# Patient Record
Sex: Male | Born: 1955 | Race: White | Hispanic: No | Marital: Married | Smoking: Current every day smoker
Health system: Southern US, Community
[De-identification: ages and names within clinical notes are randomized; demographics above are authoritative.]

## PROBLEM LIST (undated history)

## (undated) DIAGNOSIS — F1020 Alcohol dependence, uncomplicated: Secondary | ICD-10-CM

## (undated) DIAGNOSIS — R918 Other nonspecific abnormal finding of lung field: Secondary | ICD-10-CM

## (undated) HISTORY — PX: LEG SURGERY: SHX1003

## (undated) HISTORY — PX: BACK SURGERY: SHX140

## (undated) HISTORY — PX: PELVIC FRACTURE SURGERY: SHX119

---

## 2019-03-23 ENCOUNTER — Other Ambulatory Visit: Payer: Self-pay

## 2019-03-23 ENCOUNTER — Emergency Department
Admission: EM | Admit: 2019-03-23 | Discharge: 2019-03-23 | Disposition: A | Discharge status: Home / Self Care | Payer: No Typology Code available for payment source | Attending: Emergency Medicine | Admitting: Emergency Medicine

## 2019-03-23 ENCOUNTER — Encounter: Payer: Self-pay | Admitting: Emergency Medicine

## 2019-03-23 ENCOUNTER — Emergency Department: Payer: No Typology Code available for payment source

## 2019-03-23 DIAGNOSIS — S52501A Unspecified fracture of the lower end of right radius, initial encounter for closed fracture: Secondary | ICD-10-CM | POA: Diagnosis not present

## 2019-03-23 DIAGNOSIS — Y92481 Parking lot as the place of occurrence of the external cause: Secondary | ICD-10-CM | POA: Insufficient documentation

## 2019-03-23 DIAGNOSIS — S52611A Displaced fracture of right ulna styloid process, initial encounter for closed fracture: Secondary | ICD-10-CM | POA: Diagnosis not present

## 2019-03-23 DIAGNOSIS — Y9389 Activity, other specified: Secondary | ICD-10-CM | POA: Insufficient documentation

## 2019-03-23 DIAGNOSIS — W1789XA Other fall from one level to another, initial encounter: Secondary | ICD-10-CM | POA: Diagnosis not present

## 2019-03-23 DIAGNOSIS — Y998 Other external cause status: Secondary | ICD-10-CM | POA: Diagnosis not present

## 2019-03-23 DIAGNOSIS — S6991XA Unspecified injury of right wrist, hand and finger(s), initial encounter: Secondary | ICD-10-CM | POA: Diagnosis present

## 2019-03-23 MED ORDER — OXYCODONE-ACETAMINOPHEN 7.5-325 MG PO TABS: 1.0000 | ORAL_TABLET | Freq: Four times a day (QID) | ORAL | 0 refills | Status: AC | PRN

## 2019-03-23 MED ORDER — OXYCODONE-ACETAMINOPHEN 7.5-325 MG PO TABS
1.0000 | ORAL_TABLET | Freq: Once | ORAL | Status: AC
  Administered 2019-03-23: 1 via ORAL
  Filled 2019-03-23: qty 1

## 2019-03-23 NOTE — ED Triage Notes (Signed)
Pt c/o falling out of the back of a truck at [employer] and hurting his right arm/wrist. Swelling noted.

## 2019-03-23 NOTE — ED Notes (Signed)
Se triage note  Presents s/p fall  States he fell while loading something at [employer]  Positive swelling  Good pulses

## 2019-03-23 NOTE — Discharge Instructions (Signed)
Follow-up with Dr. Posey Pronto who is the orthopedist on call today.  Ice and elevation to reduce swelling which will help with pain.  Also pain medication was printed out for you to take to the [facility] may also follow-up with a orthopedist at the [facility] if that can be arranged.  Do not take the splint off until you are seen by the orthopedist.  Also the sling can be used for added support.  Do not wear the sling while sleeping.  You may prop your wrist up on 1 or 2 pillows at night.

## 2019-03-23 NOTE — ED Provider Notes (Signed)
[facility] Emergency Department Provider Note   ____________________________________________   First MD Initiated Contact with Patient 03/23/19 1206     (approximate)  I have reviewed the triage vital signs and the nursing notes.   HISTORY  Chief Complaint Arm Injury   HPI Kevin Baker is a 63 y.o. male presents to the ED with complaint of right wrist pain.  Patient states that he fell off his nephew's truck bed at [employer] today.  Patient denies any head injury or loss of consciousness.  He states his only injury is his right wrist.  Currently rates his pain as 9/10.     History reviewed. No pertinent past medical history.  There are no active problems to display for this patient.   History reviewed. No pertinent surgical history.  Prior to Admission medications   Medication Sig Start Date End Date Taking? Authorizing Provider  oxyCODONE-acetaminophen (PERCOCET) 7.5-325 MG tablet Take 1 tablet by mouth every 6 (six) hours as needed for moderate pain. 03/23/19 03/22/20  Johnn Hai, PA-C    Allergies Patient has no allergy information on record.  No family history on file.  Social History Social History   Tobacco Use  . Smoking status: Not on file  Substance Use Topics  . Alcohol use: Not on file  . Drug use: Not on file    Review of Systems Constitutional: No fever/chills Eyes: No visual changes. ENT: No trauma. Cardiovascular: Denies chest pain. Respiratory: Denies shortness of breath. Gastrointestinal: No abdominal pain.  No nausea, no vomiting. Genitourinary: Negative for dysuria. Musculoskeletal: Positive right wrist pain. Skin: Negative for rash. Neurological: Negative for headaches, focal weakness or numbness. ____________________________________________   PHYSICAL EXAM:  VITAL SIGNS: ED Triage Vitals  Enc Vitals Group     BP 03/23/19 1049 113/72     Pulse Rate 03/23/19 1049 70     Resp 03/23/19 1049 (!) 22   Temp 03/23/19 1049 98 F (36.7 C)     Temp Source 03/23/19 1049 Oral     SpO2 03/23/19 1049 97 %     Weight 03/23/19 1042 170 lb (77.1 kg)     Height 03/23/19 1042 5\' 10"  (1.778 m)     Head Circumference --      Peak Flow --      Pain Score 03/23/19 1041 9     Pain Loc --      Pain Edu? --      Excl. in [place]? --    Constitutional: Alert and oriented. Well appearing and in no acute distress. Eyes: Conjunctivae are normal. Head: Atraumatic. Nose: No trauma. Neck: No stridor.  No cervical tenderness on palpation posteriorly. Cardiovascular: Normal rate, regular rhythm. Grossly normal heart sounds.  Good peripheral circulation. Respiratory: Normal respiratory effort.  No retractions. Lungs CTAB. Musculoskeletal: Examination of the right wrist there is moderate tenderness at the distal radius and ulna.  Range of motion is completely restricted secondary to pain.  Skin is intact.  Motor sensory function intact distal to his injury.  Capillary refill is less than 3 seconds.  No tenderness on palpation of the remaining forearm or elbow.  Patient is nontender on the right shoulder.  He is able move lower extremities without any difficulty. Neurologic:  Normal speech and language. No gross focal neurologic deficits are appreciated.  Skin:  Skin is warm, dry and intact.  Psychiatric: Mood and affect are normal. Speech and behavior are normal.  ____________________________________________   LABS (all labs ordered are  listed, but only abnormal results are displayed)  Labs Reviewed - No data to display  RADIOLOGY   Official radiology report(s): Dg Wrist Complete Right  Result Date: 03/23/2019 CLINICAL DATA:  Fall. EXAM: RIGHT WRIST - COMPLETE 3+ VIEW COMPARISON:  None. FINDINGS: Comminuted fracture distal radius with dorsal angulation and mild dorsal displacement. Avulsion fracture base of the ulnar styloid. Chronic sclerotic ossicles adjacent to the ulnar styloid. Radiocarpal joint normal.   Carpal joints normal.  No other fracture. IMPRESSION: Fracture distal radius and ulnar styloid. Electronically Signed   By: Franchot Gallo M.D.   On: 03/23/2019 11:13    ____________________________________________   PROCEDURES  Procedure(s) performed (including Critical Care):  Procedures Volar OCL splint and sling was applied by Bevelyn Buckles ED tech  ____________________________________________   INITIAL IMPRESSION / ASSESSMENT AND PLAN / ED COURSE  As part of my medical decision making, I reviewed the following data within the electronic MEDICAL RECORD NUMBER Notes from prior ED visits and [place] Controlled Substance Database  63 year old male presents to the ED after falling off the back of his son's truck.  Patient complains only of his wrist hurting and states there was no head injury or LOC.  On exam there was moderate swelling and high suspicion for fracture.  X-ray confirmed that he had a fracture distal radius and ulnar styloid.  Patient was placed in a splint and sling.  He was instructed to ice and elevate as needed for swelling.  He was given a prescription for pain medication as he wants to take this to the [facility] to see if he can get it filled due to lack of funds.  He is instructed to follow-up with Dr. Posey Pronto at [facility] orthopedic department unless he can be taken care of by the orthopedic department at the [facility] hospital.  ____________________________________________   FINAL CLINICAL IMPRESSION(S) / ED DIAGNOSES  Final diagnoses:  Closed fracture of distal end of right radius, unspecified fracture morphology, initial encounter  Closed displaced fracture of styloid process of right ulna, initial encounter     ED Discharge Orders         Ordered    oxyCODONE-acetaminophen (PERCOCET) 7.5-325 MG tablet  Every 6 hours PRN     03/23/19 1335           Note:  This document was prepared using Dragon voice recognition software and may include unintentional dictation  errors.    Johnn Hai, PA-C 03/23/19 1521    Lavonia Drafts, MD 03/23/19 [phone]

## 2020-04-08 DIAGNOSIS — Z23 Encounter for immunization: Secondary | ICD-10-CM | POA: Diagnosis not present

## 2020-04-08 DIAGNOSIS — M79601 Pain in right arm: Secondary | ICD-10-CM | POA: Diagnosis not present

## 2020-04-08 DIAGNOSIS — R634 Abnormal weight loss: Secondary | ICD-10-CM | POA: Diagnosis not present

## 2020-04-08 DIAGNOSIS — M79605 Pain in left leg: Secondary | ICD-10-CM | POA: Diagnosis not present

## 2020-04-08 DIAGNOSIS — Z125 Encounter for screening for malignant neoplasm of prostate: Secondary | ICD-10-CM | POA: Diagnosis not present

## 2020-04-09 DIAGNOSIS — M19011 Primary osteoarthritis, right shoulder: Secondary | ICD-10-CM | POA: Diagnosis not present

## 2020-04-09 DIAGNOSIS — M1612 Unilateral primary osteoarthritis, left hip: Secondary | ICD-10-CM | POA: Diagnosis not present

## 2020-04-09 DIAGNOSIS — M79605 Pain in left leg: Secondary | ICD-10-CM | POA: Diagnosis not present

## 2020-04-09 DIAGNOSIS — M79601 Pain in right arm: Secondary | ICD-10-CM | POA: Diagnosis not present

## 2020-04-18 DIAGNOSIS — M1652 Unilateral post-traumatic osteoarthritis, left hip: Secondary | ICD-10-CM | POA: Diagnosis not present

## 2020-04-18 DIAGNOSIS — M25552 Pain in left hip: Secondary | ICD-10-CM | POA: Diagnosis not present

## 2020-04-18 DIAGNOSIS — M47816 Spondylosis without myelopathy or radiculopathy, lumbar region: Secondary | ICD-10-CM | POA: Diagnosis not present

## 2020-04-25 DIAGNOSIS — Z9181 History of falling: Secondary | ICD-10-CM | POA: Diagnosis not present

## 2020-04-25 DIAGNOSIS — M1652 Unilateral post-traumatic osteoarthritis, left hip: Secondary | ICD-10-CM | POA: Diagnosis not present

## 2020-04-25 DIAGNOSIS — R32 Unspecified urinary incontinence: Secondary | ICD-10-CM | POA: Diagnosis not present

## 2020-04-25 DIAGNOSIS — M47816 Spondylosis without myelopathy or radiculopathy, lumbar region: Secondary | ICD-10-CM | POA: Diagnosis not present

## 2020-04-26 DIAGNOSIS — M1652 Unilateral post-traumatic osteoarthritis, left hip: Secondary | ICD-10-CM | POA: Diagnosis not present

## 2020-04-26 DIAGNOSIS — M25552 Pain in left hip: Secondary | ICD-10-CM | POA: Diagnosis not present

## 2020-04-26 DIAGNOSIS — M47816 Spondylosis without myelopathy or radiculopathy, lumbar region: Secondary | ICD-10-CM | POA: Diagnosis not present

## 2020-04-30 DIAGNOSIS — Z9181 History of falling: Secondary | ICD-10-CM | POA: Diagnosis not present

## 2020-04-30 DIAGNOSIS — R32 Unspecified urinary incontinence: Secondary | ICD-10-CM | POA: Diagnosis not present

## 2020-04-30 DIAGNOSIS — M47816 Spondylosis without myelopathy or radiculopathy, lumbar region: Secondary | ICD-10-CM | POA: Diagnosis not present

## 2020-04-30 DIAGNOSIS — M1652 Unilateral post-traumatic osteoarthritis, left hip: Secondary | ICD-10-CM | POA: Diagnosis not present

## 2020-05-02 DIAGNOSIS — R32 Unspecified urinary incontinence: Secondary | ICD-10-CM | POA: Diagnosis not present

## 2020-05-02 DIAGNOSIS — M47816 Spondylosis without myelopathy or radiculopathy, lumbar region: Secondary | ICD-10-CM | POA: Diagnosis not present

## 2020-05-02 DIAGNOSIS — Z9181 History of falling: Secondary | ICD-10-CM | POA: Diagnosis not present

## 2020-05-02 DIAGNOSIS — M1652 Unilateral post-traumatic osteoarthritis, left hip: Secondary | ICD-10-CM | POA: Diagnosis not present

## 2020-05-13 ENCOUNTER — Other Ambulatory Visit: Payer: Self-pay

## 2020-05-13 ENCOUNTER — Observation Stay
Admission: EM | Admit: 2020-05-13 | Discharge: 2020-05-14 | Disposition: A | Discharge status: Home / Self Care | Payer: No Typology Code available for payment source | Attending: Internal Medicine | Admitting: Internal Medicine

## 2020-05-13 ENCOUNTER — Emergency Department: Payer: No Typology Code available for payment source

## 2020-05-13 DIAGNOSIS — R1032 Left lower quadrant pain: Secondary | ICD-10-CM | POA: Diagnosis not present

## 2020-05-13 DIAGNOSIS — R600 Localized edema: Principal | ICD-10-CM | POA: Insufficient documentation

## 2020-05-13 DIAGNOSIS — R0602 Shortness of breath: Secondary | ICD-10-CM | POA: Diagnosis not present

## 2020-05-13 DIAGNOSIS — M169 Osteoarthritis of hip, unspecified: Secondary | ICD-10-CM

## 2020-05-13 DIAGNOSIS — R262 Difficulty in walking, not elsewhere classified: Secondary | ICD-10-CM

## 2020-05-13 DIAGNOSIS — M79604 Pain in right leg: Secondary | ICD-10-CM

## 2020-05-13 DIAGNOSIS — F17210 Nicotine dependence, cigarettes, uncomplicated: Secondary | ICD-10-CM | POA: Diagnosis not present

## 2020-05-13 DIAGNOSIS — I509 Heart failure, unspecified: Secondary | ICD-10-CM | POA: Diagnosis not present

## 2020-05-13 DIAGNOSIS — I5031 Acute diastolic (congestive) heart failure: Secondary | ICD-10-CM | POA: Diagnosis not present

## 2020-05-13 DIAGNOSIS — R0601 Orthopnea: Secondary | ICD-10-CM | POA: Insufficient documentation

## 2020-05-13 DIAGNOSIS — Z20822 Contact with and (suspected) exposure to covid-19: Secondary | ICD-10-CM | POA: Insufficient documentation

## 2020-05-13 DIAGNOSIS — K4090 Unilateral inguinal hernia, without obstruction or gangrene, not specified as recurrent: Secondary | ICD-10-CM | POA: Diagnosis not present

## 2020-05-13 DIAGNOSIS — M25552 Pain in left hip: Secondary | ICD-10-CM

## 2020-05-13 DIAGNOSIS — R918 Other nonspecific abnormal finding of lung field: Secondary | ICD-10-CM

## 2020-05-13 DIAGNOSIS — I82409 Acute embolism and thrombosis of unspecified deep veins of unspecified lower extremity: Secondary | ICD-10-CM

## 2020-05-13 DIAGNOSIS — R609 Edema, unspecified: Secondary | ICD-10-CM

## 2020-05-13 DIAGNOSIS — R2243 Localized swelling, mass and lump, lower limb, bilateral: Secondary | ICD-10-CM | POA: Diagnosis present

## 2020-05-13 DIAGNOSIS — Z789 Other specified health status: Secondary | ICD-10-CM

## 2020-05-13 LAB — COMPREHENSIVE METABOLIC PANEL
ALT: 7 U/L (ref 0–44)
AST: 17 U/L (ref 15–41)
Albumin: 3.3 g/dL — ABNORMAL LOW (ref 3.5–5.0)
Alkaline Phosphatase: 50 U/L (ref 38–126)
Anion gap: 10 (ref 5–15)
BUN: 9 mg/dL (ref 8–23)
CO2: 25 mmol/L (ref 22–32)
Calcium: 8.9 mg/dL (ref 8.9–10.3)
Chloride: 102 mmol/L (ref 98–111)
Creatinine, Ser: 0.58 mg/dL — ABNORMAL LOW (ref 0.61–1.24)
GFR, Estimated: >60 mL/min (ref >60)
Glucose, Bld: 95 mg/dL (ref 70–99)
Potassium: 3.6 mmol/L (ref 3.5–5.1)
Sodium: 137 mmol/L (ref 135–145)
Total Bilirubin: 0.8 mg/dL (ref 0.3–1.2)
Total Protein: 6.9 g/dL (ref 6.5–8.1)

## 2020-05-13 LAB — CBC
HCT: 32.3 % — ABNORMAL LOW (ref 39.0–52.0)
Hemoglobin: 11.1 g/dL — ABNORMAL LOW (ref 13.0–17.0)
MCH: 35 pg — ABNORMAL HIGH (ref 26.0–34.0)
MCHC: 34.4 g/dL (ref 30.0–36.0)
MCV: 101.9 fL — ABNORMAL HIGH (ref 80.0–100.0)
Platelets: 384 10*3/uL (ref 150–400)
RBC: 3.17 MIL/uL — ABNORMAL LOW (ref 4.22–5.81)
RDW: 13.2 % (ref 11.5–15.5)
WBC: 9.2 10*3/uL (ref 4.0–10.5)
nRBC: 0 % (ref 0.0–0.2)

## 2020-05-13 LAB — URINALYSIS, COMPLETE (UACMP) WITH MICROSCOPIC
Bacteria, UA: NONE SEEN
Bilirubin Urine: NEGATIVE
Glucose, UA: NEGATIVE mg/dL
Hgb urine dipstick: NEGATIVE
Ketones, ur: 5 mg/dL — AB
Nitrite: NEGATIVE
Protein, ur: NEGATIVE mg/dL
Specific Gravity, Urine: 1.012 (ref 1.005–1.030)
Squamous Epithelial / HPF: NONE SEEN (ref 0–5)
pH: 5 (ref 5.0–8.0)

## 2020-05-13 LAB — BRAIN NATRIURETIC PEPTIDE: B Natriuretic Peptide: 120.2 pg/mL — ABNORMAL HIGH (ref 0.0–100.0)

## 2020-05-13 LAB — TROPONIN I (HIGH SENSITIVITY)
Troponin I (High Sensitivity): 4 ng/L (ref <18)
Troponin I (High Sensitivity): 4 ng/L (ref <18)

## 2020-05-13 LAB — RESP PANEL BY RT-PCR (FLU A&B, COVID) ARPGX2
Influenza A by PCR: NEGATIVE
Influenza B by PCR: NEGATIVE
SARS Coronavirus 2 by RT PCR: NEGATIVE

## 2020-05-13 LAB — LIPASE, BLOOD: Lipase: 26 U/L (ref 11–51)

## 2020-05-13 LAB — TSH: TSH: 3.716 u[IU]/mL (ref 0.350–4.500)

## 2020-05-13 MED ORDER — SODIUM CHLORIDE 0.9% FLUSH: 3.0000 mL | INTRAVENOUS | Status: DC | PRN

## 2020-05-13 MED ORDER — ACETAMINOPHEN 325 MG PO TABS: 650.0000 mg | ORAL_TABLET | ORAL | Status: DC | PRN

## 2020-05-13 MED ORDER — ONDANSETRON HCL 4 MG/2ML IJ SOLN: 4.0000 mg | Freq: Four times a day (QID) | INTRAMUSCULAR | Status: DC | PRN

## 2020-05-13 MED ORDER — FUROSEMIDE 10 MG/ML IJ SOLN
40.0000 mg | Freq: Once | INTRAMUSCULAR | Status: AC
  Administered 2020-05-13: 40 mg via INTRAVENOUS
  Filled 2020-05-13: qty 4

## 2020-05-13 MED ORDER — FUROSEMIDE 10 MG/ML IJ SOLN
20.0000 mg | Freq: Two times a day (BID) | INTRAMUSCULAR | Status: DC
  Administered 2020-05-14: 10:00:00 20 mg via INTRAVENOUS
  Filled 2020-05-13: qty 4

## 2020-05-13 MED ORDER — ENOXAPARIN SODIUM 40 MG/0.4ML ~~LOC~~ SOLN
40.0000 mg | SUBCUTANEOUS | Status: DC
  Administered 2020-05-13: 40 mg via SUBCUTANEOUS

## 2020-05-13 MED ORDER — SODIUM CHLORIDE 0.9 % IV SOLN: 250.0000 mL | INTRAVENOUS | Status: DC | PRN

## 2020-05-13 MED ORDER — SODIUM CHLORIDE 0.9% FLUSH
3.0000 mL | Freq: Two times a day (BID) | INTRAVENOUS | Status: DC
  Administered 2020-05-14: 10:00:00 3 mL via INTRAVENOUS

## 2020-05-13 MED ORDER — MORPHINE SULFATE (PF) 4 MG/ML IV SOLN
4.0000 mg | Freq: Once | INTRAVENOUS | Status: AC
  Administered 2020-05-13: 4 mg via INTRAVENOUS
  Filled 2020-05-13: qty 1

## 2020-05-13 NOTE — ED Triage Notes (Signed)
PT to ED via EMS from home c/o groin pain as well as bilateral leg swelling, worse on left side. 3 weeks ago he got some kind of shot in his groin "towards the hip" and has been "suffering ever sicne" and pain is getting worse. PT states pain is worse when he coughs. No obvious hernia.  Endorses some SHOB but states it may be because he is a smoker.  PT also states intermittent diarrhea for 3 months.

## 2020-05-13 NOTE — H&P (Signed)
History and Physical    Nathaneal Sommers JME:[id number] DOB: 1955/09/15 DOA: 05/13/2020  PCP: Patient, No Pcp Per   Patient coming from: home   I have personally briefly reviewed patient's old medical records in [place]  Chief Complaint: Lower leg swelling, persistent left groin pain  HPI: Taryn Nave is a 64 y.o. 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  Also complains of worsening left hip pain, and difficulty ambulating since his steroid shot.  In the ER he was noted to have bilateral lower extremities swelling which has been there for the past x3 weeks.  He endorses shortness of breath with some orthopnea and has a cough.  He denies chest pain, fever or chills.   ED Course: On arrival, vitals within normal limits.  He had 2 - troponins of 4 and an elevated BNP of 120.  Mild macrocytic anemia with hemoglobin of 11.1 and MCV 101.9. EKG as reviewed by me : Normal sinus rhythm at 76 and no acute ST-T wave changes Imaging: Chest x-ray, left basilar opacity, atelectasis versus infiltrate.  Left hip x-ray showed no acute osseous abnormality Addendum: Post admission CT abdomen and pelvis showed "Lobulated mass within the left lung base, not fully assessed on this examination, suspicious or primary bronchogenic neoplasm"  Review of Systems: As per HPI otherwise all other systems on review of systems negative.    History reviewed. No pertinent past medical history.  Past Surgical History:  Procedure Laterality Date  . LEG SURGERY Right      reports that he has been smoking cigarettes. He has been smoking about 3.00 packs per day. He has never used smokeless tobacco. He reports current  alcohol use. He reports previous drug use.  No Known Allergies  History reviewed. No pertinent family history.    Prior to Admission medications   Not on File    Physical Exam: Vitals:   05/13/20 1841 05/13/20 1933 05/13/20 1945 05/13/20 2000  BP: 120/79 110/85 124/74 (!) 148/24  Pulse: 75 75 70 79  Resp: 16 20    Temp: 98.4 F (36.9 C) 98.2 F (36.8 C)    TempSrc: Oral Oral    SpO2: 97% 95% 99% 100%  Weight:      Height:         Vitals:   05/13/20 1841 05/13/20 1933 05/13/20 1945 05/13/20 2000  BP: 120/79 110/85 124/74 (!) 148/24  Pulse: 75 75 70 79  Resp: 16 20    Temp: 98.4 F (36.9 C) 98.2 F (36.8 C)    TempSrc: Oral Oral    SpO2: 97% 95% 99% 100%  Weight:      Height:          Constitutional:  Unkempt, ill-appearing,.  Lying on left side due to pain HEENT:      Head: Normocephalic and atraumatic.         Eyes: PERLA, EOMI, Conjunctivae are normal. Sclera is non-icteric.       Mouth/Throat: Mucous membranes are moist.       Neck: Supple with no signs of meningismus. Cardiovascular: Regular rate and rhythm. No murmurs, gallops, or rubs. 2+ symmetrical distal pulses are present . No JVD.  3+LE edema Respiratory: Respiratory effort slightly increased.Lungs sounds diminished on left. No wheezes, crackles, or rhonchi.  Gastrointestinal: Soft, pain on palpation in left inguinal area, no definite hernia felt  genitourinary: No CVA tenderness. Musculoskeletal:  Pain on range of motion of left hip.  No cyanosis, or erythema of extremities.  Bilateral lower extremity soft pitting edema Neurologic:  Face is symmetric. Moving all extremities. No gross focal neurologic deficits . Skin: Skin is warm, dry.  No rash or ulcers Psychiatric: Mood and affect are normal    Labs on Admission: I have personally reviewed following labs and imaging studies  CBC: Recent Labs  Lab 05/13/20 1557  WBC 9.2  HGB 11.1*  HCT 32.3*  MCV 101.9*  PLT 035   Basic Metabolic  Panel: Recent Labs  Lab 05/13/20 1557  NA 137  K 3.6  CL 102  CO2 25  GLUCOSE 95  BUN 9  CREATININE 0.58*  CALCIUM 8.9   GFR: Estimated Creatinine Clearance: 88.5 mL/min (A) (by C-G formula based on SCr of 0.58 mg/dL (L)). Liver Function Tests: Recent Labs  Lab 05/13/20 1557  AST 17  ALT 7  ALKPHOS 50  BILITOT 0.8  PROT 6.9  ALBUMIN 3.3*   Recent Labs  Lab 05/13/20 1557  LIPASE 26   No results for input(s): AMMONIA in the last 168 hours. Coagulation Profile: No results for input(s): INR, PROTIME in the last 168 hours. Cardiac Enzymes: No results for input(s): CKTOTAL, CKMB, CKMBINDEX, TROPONINI in the last 168 hours. BNP (last 3 results) No results for input(s): PROBNP in the last 8760 hours. HbA1C: No results for input(s): HGBA1C in the last 72 hours. CBG: No results for input(s): GLUCAP in the last 168 hours. Lipid Profile: No results for input(s): CHOL, HDL, LDLCALC, TRIG, CHOLHDL, LDLDIRECT in the last 72 hours. Thyroid Function Tests: No results for input(s): TSH, T4TOTAL, FREET4, T3FREE, THYROIDAB in the last 72 hours. Anemia Panel: No results for input(s): VITAMINB12, FOLATE, FERRITIN, TIBC, IRON, RETICCTPCT in the last 72 hours. Urine analysis: No results found for: COLORURINE, APPEARANCEUR, LABSPEC, [place], GLUCOSEU, HGBUR, BILIRUBINUR, KETONESUR, PROTEINUR, UROBILINOGEN, NITRITE, LEUKOCYTESUR  Radiological Exams on Admission: DG Chest 2 View  Result Date: 05/13/2020 CLINICAL DATA:  Shortness of breath EXAM: CHEST - 2 VIEW COMPARISON:  None. FINDINGS: Linear left basilar opacity. No pneumothorax or pleural effusion. Cardiomediastinal silhouette within normal limits. No acute osseous abnormality. IMPRESSION: Left basilar opacity, atelectasis versus infiltrate. Electronically Signed   By: Primitivo Gauze M.D.   On: 05/13/2020 16:33   DG Hip Unilat W or Wo Pelvis 2-3 Views Left  Result Date: 05/13/2020 CLINICAL DATA:  Fall pain EXAM: DG HIP (WITH  OR WITHOUT PELVIS) 2-3V LEFT COMPARISON:  None. FINDINGS: There is no evidence of hip fracture or dislocation. Again a probable healed fracture deformity the inferior pubic rami and acetabulum. Vascular calcifications are seen. IMPRESSION: No acute osseous abnormality Electronically Signed   By: Prudencio Pair M.D.   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  Chest x-ray showing left basilar opacity (addendum, left lung mass seen on CT abdomen and pelvis) -Received Lasix 40 mg IV in the emergency room -We will continue low-dose Lasix -We will hold beta-blocker and ACE inhibitor for now given just modest suspicion for CHF -Echocardiogram in the a.m. -Daily weights, intake and output monitoring    Acute on chronic pain of left hip   Ambulatory dysfunction   Degenerative joint disease (DJD) of hip -Patient received an intra-articular steroid shot to the left hip under ultrasound guidance by orthopedist on 12/10, now presents with persistent pain and worsening ambulation -Pain management -Physical therapy consult in the a.m.  Left groin pain -While awaiting admission, patient started vomiting in the ER and CT abdomen and pelvis was ordered -CT abdomen and pelvis showing a fat-containing left inguinal hernia  -Pain control -Surgical consult  Lung mass on CT -Patient symptomatic for cough -CT abdomen and pelvis showed lobulated mass within the left lung base,  not fully assessed on this examination, suspicious or primary bronchogenic neoplasm with recommendation for CT chest with contrast. -Consider pulmonology/oncology consult in the a.m.  Alcohol use disorder -Patient drinks vodka and whiskey about 5 drinks daily -Last drink was a few hours prior to arrival -CIWA withdrawal protocol    DVT prophylaxis: Lovenox  Code Status: full code  Family Communication:  none  Disposition Plan: Back to previous home environment Consults called: Surgery Status: Observation    Athena Masse MD [facility]     05/13/2020, 10:18 PM

## 2020-05-13 NOTE — ED Provider Notes (Signed)
[facility] Emergency Department Provider Note ____________________________________________   Event Date/Time   First MD Initiated Contact with Patient 05/13/20 1935     (approximate)  I have reviewed the triage vital signs and the nursing notes.  HISTORY  Chief Complaint Leg Swelling   HPI Kevin Baker is a 64 y.o. malewho presents to the ED for evaluation of leg swelling.   Chart review indicates history of osteoarthritis of the left hip and orthopedic visit on 12/10 where patient had a steroid injection to this left hip joint. Otherwise history of alcohol abuse and hep C, PTSD, gastritis.  Patient reports acute on chronic left hip pain at the site of his arthritis that is atraumatic.  Denies any recent falls.  He does report increasing lower extremity edema, swelling there is also atraumatic.  He reports shortness of breath and orthopnea alongside this.  Denies any chest pain, pressure, cough, emesis or abdominal pain.  Reports having difficulty ambulating with his baseline walker due to the swelling and pain in his legs.  History reviewed. No pertinent past medical history.  There are no problems to display for this patient.   Past Surgical History:  Procedure Laterality Date  . LEG SURGERY Right     Prior to Admission medications   Not on File    Allergies Patient has no known allergies.  No family history on file.  Social History Social History   Tobacco Use  . Smoking status: Current Every Day Smoker    Packs/day: 3.00    Types: Cigarettes  . Smokeless tobacco: Never Used  Substance Use Topics  . Alcohol use: Yes    Comment: for last 3 weeks, drinking daily   . Drug use: Not Currently    Review of Systems  Constitutional: No fever/chills Eyes: No visual changes. ENT: No sore throat. Cardiovascular: Denies chest pain. Respiratory: Positive shortness of breath and orthopnea. Gastrointestinal: No abdominal pain.  No nausea,  no vomiting.  No diarrhea.  No constipation. Genitourinary: Negative for dysuria. Musculoskeletal: Positive for leg swelling and pain. Skin: Negative for rash. Neurological: Negative for headaches, focal weakness or numbness.  ____________________________________________   PHYSICAL EXAM:  VITAL SIGNS: Vitals:   05/13/20 1945 05/13/20 2000  BP: 124/74 (!) 148/24  Pulse: 70 79  Resp:    Temp:    SpO2: 99% 100%     Constitutional: Alert and oriented. Well appearing and in no acute distress. Eyes: Conjunctivae are normal. PERRL. EOMI. Head: Atraumatic. Nose: No congestion/rhinnorhea. Mouth/Throat: Mucous membranes are moist.  Oropharynx non-erythematous. Neck: No stridor. No cervical spine tenderness to palpation. Cardiovascular: Normal rate, regular rhythm. Grossly normal heart sounds.  Good peripheral circulation. Respiratory: Mild tachypnea to the low 20s bibasilar crackles present, otherwise clear.  No distress or retractions. Gastrointestinal: Soft , nondistended, nontender to palpation. No CVA tenderness. Musculoskeletal: No signs of trauma to the lower extremities or back. Pitting edema to bilateral lower extremities without overlying skin changes. Neurologic:  Normal speech and language. No gross focal neurologic deficits are appreciated.  Skin:  Skin is warm, dry and intact. No rash noted. Psychiatric: Mood and affect are normal. Speech and behavior are normal.  ____________________________________________   LABS (all labs ordered are listed, but only abnormal results are displayed)  Labs Reviewed  COMPREHENSIVE METABOLIC PANEL - Abnormal; Notable for the following components:      Result Value   Creatinine, Ser 0.58 (*)    Albumin 3.3 (*)    All other components within  normal limits  CBC - Abnormal; Notable for the following components:   RBC 3.17 (*)    Hemoglobin 11.1 (*)    HCT 32.3 (*)    MCV 101.9 (*)    MCH 35.0 (*)    All other components within  normal limits  BRAIN NATRIURETIC PEPTIDE - Abnormal; Notable for the following components:   B Natriuretic Peptide 120.2 (*)    All other components within normal limits  RESP PANEL BY RT-PCR (FLU A&B, COVID) ARPGX2  LIPASE, BLOOD  URINALYSIS, COMPLETE (UACMP) WITH MICROSCOPIC  TROPONIN I (HIGH SENSITIVITY)  TROPONIN I (HIGH SENSITIVITY)   ____________________________________________  12 Lead EKG  Sinus rhythm, Rate of 76 bpm.  Normal axis and intervals.  No evidence of acute ischemia. ____________________________________________  RADIOLOGY  ED MD interpretation: 2 view CXR with pulmonary vascular congestion and left linear basilar opacity.  Official radiology report(s): DG Chest 2 View  Result Date: 05/13/2020 CLINICAL DATA:  Shortness of breath EXAM: CHEST - 2 VIEW COMPARISON:  None. FINDINGS: Linear left basilar opacity. No pneumothorax or pleural effusion. Cardiomediastinal silhouette within normal limits. No acute osseous abnormality. IMPRESSION: Left basilar opacity, atelectasis versus infiltrate. Electronically Signed   By: Primitivo Gauze M.D.   On: 05/13/2020 16:33   DG Hip Unilat W or Wo Pelvis 2-3 Views Left  Result Date: 05/13/2020 CLINICAL DATA:  Fall pain EXAM: DG HIP (WITH OR WITHOUT PELVIS) 2-3V LEFT COMPARISON:  None. FINDINGS: There is no evidence of hip fracture or dislocation. Again a probable healed fracture deformity the inferior pubic rami and acetabulum. Vascular calcifications are seen. IMPRESSION: No acute osseous abnormality Electronically Signed   By: Prudencio Pair M.D.   On: 05/13/2020 20:51    ____________________________________________   PROCEDURES and INTERVENTIONS  Procedure(s) performed (including Critical Care):  .1-3 Lead EKG Interpretation Performed by: Vladimir Crofts, MD Authorized by: Vladimir Crofts, MD     Interpretation: normal     ECG rate:  78   ECG rate assessment: normal     Rhythm: sinus rhythm     Ectopy: none      Conduction: normal      Medications  furosemide (LASIX) injection 40 mg (has no administration in time range)  morphine 4 MG/ML injection 4 mg (4 mg Intravenous Given 05/13/20 1953)    ____________________________________________   MDM / ED COURSE   64 year old male presents to the ED with worsening lower extremity edema, orthopnea and difficulty walking requiring medical admission.  Normal vitals on room air.  Exam with impressive edema to his bilateral lower extremities without overlying skin changes or signs of trauma.  No distress.  Blood work with elevated BNP, suggestive of possible cardiac source of his edema.  CXR appears congested to me.  Patient has no fevers or leukocytosis to suggest pneumonia at this time.  We will initiate diuresis with Lasix and admit for physical therapy, diuresis.   ____________________________________________   FINAL CLINICAL IMPRESSION(S) / ED DIAGNOSES  Final diagnoses:  Peripheral edema  Orthopnea     ED Discharge Orders    None       Dennis Killilea Tamala Julian   Note:  This document was prepared using Dragon voice recognition software and may include unintentional dictation errors.   Vladimir Crofts, MD 05/13/20 2155

## 2020-05-14 ENCOUNTER — Observation Stay: Payer: No Typology Code available for payment source

## 2020-05-14 ENCOUNTER — Observation Stay
Admit: 2020-05-14 | Discharge: 2020-05-14 | Disposition: A | Discharge status: Home / Self Care | Payer: No Typology Code available for payment source | Attending: Internal Medicine | Admitting: Internal Medicine

## 2020-05-14 DIAGNOSIS — R918 Other nonspecific abnormal finding of lung field: Secondary | ICD-10-CM

## 2020-05-14 DIAGNOSIS — K4090 Unilateral inguinal hernia, without obstruction or gangrene, not specified as recurrent: Secondary | ICD-10-CM

## 2020-05-14 LAB — BASIC METABOLIC PANEL
Anion gap: 11 (ref 5–15)
BUN: 8 mg/dL (ref 8–23)
CO2: 27 mmol/L (ref 22–32)
Calcium: 9.2 mg/dL (ref 8.9–10.3)
Chloride: 100 mmol/L (ref 98–111)
Creatinine, Ser: 0.6 mg/dL — ABNORMAL LOW (ref 0.61–1.24)
GFR, Estimated: >60 mL/min (ref >60)
Glucose, Bld: 99 mg/dL (ref 70–99)
Potassium: 3.4 mmol/L — ABNORMAL LOW (ref 3.5–5.1)
Sodium: 138 mmol/L (ref 135–145)

## 2020-05-14 LAB — ECHOCARDIOGRAM COMPLETE
Height: 72 in
S' Lateral: 2.99 cm
Weight: 2400 oz

## 2020-05-14 LAB — MAGNESIUM: Magnesium: 1.1 mg/dL — ABNORMAL LOW (ref 1.7–2.4)

## 2020-05-14 LAB — PHOSPHORUS: Phosphorus: 2.6 mg/dL (ref 2.5–4.6)

## 2020-05-14 MED ORDER — LORAZEPAM 2 MG/ML IJ SOLN
0.0000 mg | Freq: Four times a day (QID) | INTRAMUSCULAR | Status: DC
  Administered 2020-05-14 (×2): 2 mg via INTRAVENOUS
  Filled 2020-05-14 (×2): qty 1

## 2020-05-14 MED ORDER — OXYCODONE HCL 5 MG PO TABS: 5.0000 mg | ORAL_TABLET | ORAL | Status: DC | PRN

## 2020-05-14 MED ORDER — NALTREXONE HCL 50 MG PO TABS
50.0000 mg | ORAL_TABLET | Freq: Every day | ORAL | Status: DC
  Administered 2020-05-14: 10:00:00 50 mg via ORAL
  Filled 2020-05-14: qty 1

## 2020-05-14 MED ORDER — BACLOFEN 10 MG PO TABS
10.0000 mg | ORAL_TABLET | Freq: Three times a day (TID) | ORAL | Status: DC | PRN
  Filled 2020-05-14: qty 1

## 2020-05-14 MED ORDER — BUPROPION HCL ER (SR) 150 MG PO TB12
150.0000 mg | ORAL_TABLET | Freq: Every day | ORAL | Status: DC
  Administered 2020-05-14: 10:00:00 150 mg via ORAL
  Filled 2020-05-14: qty 1

## 2020-05-14 MED ORDER — PANTOPRAZOLE SODIUM 40 MG PO TBEC
40.0000 mg | DELAYED_RELEASE_TABLET | Freq: Every day | ORAL | Status: DC
  Administered 2020-05-14: 10:00:00 40 mg via ORAL
  Filled 2020-05-14: qty 1

## 2020-05-14 MED ORDER — THIAMINE HCL 100 MG PO TABS
100.0000 mg | ORAL_TABLET | Freq: Every day | ORAL | Status: DC
  Administered 2020-05-14: 10:00:00 100 mg via ORAL
  Filled 2020-05-14: qty 1

## 2020-05-14 MED ORDER — IOHEXOL 300 MG/ML  SOLN
75.0000 mL | Freq: Once | INTRAMUSCULAR | Status: AC | PRN
  Administered 2020-05-14: 09:00:00 75 mL via INTRAVENOUS
  Filled 2020-05-14: qty 75

## 2020-05-14 MED ORDER — HYDROXYZINE HCL 25 MG PO TABS: 25.0000 mg | ORAL_TABLET | Freq: Three times a day (TID) | ORAL | Status: DC | PRN

## 2020-05-14 MED ORDER — LORAZEPAM 2 MG/ML IJ SOLN: 1.0000 mg | INTRAMUSCULAR | Status: DC | PRN

## 2020-05-14 MED ORDER — LORAZEPAM 2 MG/ML IJ SOLN: 0.0000 mg | Freq: Two times a day (BID) | INTRAMUSCULAR | Status: DC

## 2020-05-14 MED ORDER — THIAMINE HCL 100 MG/ML IJ SOLN: 100.0000 mg | Freq: Every day | INTRAMUSCULAR | Status: DC

## 2020-05-14 MED ORDER — LORAZEPAM 1 MG PO TABS: 1.0000 mg | ORAL_TABLET | ORAL | Status: DC | PRN

## 2020-05-14 MED ORDER — MORPHINE SULFATE (PF) 2 MG/ML IV SOLN: 2.0000 mg | INTRAVENOUS | Status: DC | PRN

## 2020-05-14 MED ORDER — NICOTINE 21 MG/24HR TD PT24
21.0000 mg | MEDICATED_PATCH | Freq: Once | TRANSDERMAL | Status: DC
  Administered 2020-05-14: 02:00:00 21 mg via TRANSDERMAL
  Filled 2020-05-14: qty 1

## 2020-05-14 MED ORDER — ADULT MULTIVITAMIN W/MINERALS CH
1.0000 | ORAL_TABLET | Freq: Every day | ORAL | Status: DC
  Administered 2020-05-14: 10:00:00 1 via ORAL
  Filled 2020-05-14: qty 1

## 2020-05-14 MED ORDER — ADULT MULTIVITAMIN W/MINERALS CH: 1.0000 | ORAL_TABLET | Freq: Every day | ORAL | Status: DC

## 2020-05-14 MED ORDER — FOLIC ACID 1 MG PO TABS
1.0000 mg | ORAL_TABLET | Freq: Every day | ORAL | Status: DC
  Administered 2020-05-14: 10:00:00 1 mg via ORAL
  Filled 2020-05-14: qty 1

## 2020-05-14 MED ORDER — VITAMIN D3 25 MCG (1000 UNIT) PO TABS
2000.0000 [IU] | ORAL_TABLET | Freq: Every day | ORAL | Status: DC
  Administered 2020-05-14: 10:00:00 2000 [IU] via ORAL
  Filled 2020-05-14 (×2): qty 2

## 2020-05-14 NOTE — ED Notes (Signed)
Patient chose to leave department against medical advice. Patient left department before speaking with admitting MD. Last observed awake alert in NAD with family.

## 2020-05-14 NOTE — Discharge Summary (Addendum)
AMA discharge summary  Patient is a 64 year old male with history significant for daily alcohol use, hepatitis C, chronic gastritis, DJD of left hip who presents to the ED for evaluation of increased left groin pain worse with coughing.  Imaging was demonstrative of a small hernia.  I evaluated the patient with Dr. Lysle Pearl of general surgery.  Hernia was identified on exam but felt to be reducible and not incarcerated.  No surgical intervention warranted.  On my evaluation the patient looks uncomfortable and in some distress.  His left leg is markedly edematous more so than his right.  Imaging was concerning for a mass at the base of the lungs suspicious for primary bronchogenic carcinoma.  I discussed these issues with the patient and the need for continued medical follow-up.  This morning he agreed to stay in the hospital and undergo studies.  We also talked about his alcohol consumption.  He was already starting to exhibit mild signs of withdrawal on my evaluation this morning.  I explained to him that his withdrawal symptoms would likely worsen but if he was interested in quitting drinking we would take him to his withdrawals and provide him with resources for substance abuse cessation.  At time of my evaluation again he agreed to these interventions.  Unfortunately at approximately 1300 this afternoon I received a notice from the bedside RN stating that the patient wished to leave AMA.  She requested that I come to bedside and discussed with the patient.  I agreed to do so however was unable to make it until approximately 1400.  At that time the patient had already left without waiting to speak to me.  RN informed patient that he needed to follow-up with his primary care physician soon as possible.  Patient understood and agreed to do so.  Without intervention patient is high risk for medical complications and readmission.  Ralene Muskrat MD  Addendum: CT thorax with contrast resulted.  Highly  suspicious for primary bronchogenic carcinoma.  Patient was strongly encouraged to seek medical attention.  He will need referral to oncology and pulmonology.

## 2020-05-14 NOTE — Evaluation (Signed)
Occupational Therapy Evaluation Patient Details Name: Kevin Baker MRN: [id number] DOB: 08-Dec-1955 Today's Date: 05/14/2020    History of Present Illness Kevin Baker is a 64 y.o. male with PMH of daily alcohol use, hepatitis C, PTSD and gastritis as well as DJD of left hip with chronic pain for which he got an intra-articular steroid shot on 12/10 by his orthopedist, presenting with a complaint of increased left hip pain but also swelling in his bilateral lower extremities x3 weeks, shortness of breath  and has a mild cough   Clinical Impression   Kevin Baker was seen for OT evaluation this date. Prior to hospital admission, pt was Independent for mobility and ADLs. Pt lives alone, states there he recently got a roommate but doe snot interact with him much. Pt presents to acute OT demonstrating impaired ADL performance and functional mobility 2/2 decreased LB access, functional strength/balance deficits, and poor insight into deficits.   Pt found seated at end of stretcher, MOD A sit>sup. MIN A adjust B socks seated EOB - assist 2/2 L hip pain and shakiness. MIN A + HHA for ADL t/f - tolerates <30 seconds standing prior to return to sitting 2/2 shakiness and L hip pain. Pt would benefit from skilled OT to address noted impairments and functional limitations (see below for any additional details) in order to maximize safety and independence while minimizing falls risk and caregiver burden. Upon hospital discharge, recommend STR to maximize pt safety and return to PLOF.     Follow Up Recommendations  SNF    Equipment Recommendations  Other (comment) (TBD)    Recommendations for Other Services       Precautions / Restrictions Precautions Precautions: Fall Restrictions Weight Bearing Restrictions: No      Mobility Bed Mobility Overal bed mobility: Needs Assistance Bed Mobility: Sit to Supine       Sit to supine: Mod assist   General bed mobility comments: found seated at end of  stretcher. Assist for trunk control and LLE    Transfers Overall transfer level: Needs assistance Equipment used: 1 person hand held assist Transfers: Sit to/from Stand Sit to Stand: Min assist         General transfer comment: MIN A + HHA + L rail posterior lean    Balance Overall balance assessment: Needs assistance Sitting-balance support: Single extremity supported;Feet unsupported Sitting balance-Leahy Scale: Good     Standing balance support: Bilateral upper extremity supported Standing balance-Leahy Scale: Poor                             ADL either performed or assessed with clinical judgement   ADL Overall ADL's : Needs assistance/impaired                                       General ADL Comments: MIN A adjust B socks seated EOB - assist 2/2 L hip pain and shakiness. MIN A + HHA for ADL t/f - tolerates <30 seconds standing prior to return to sitting 2/2 shakiness and pain                  Pertinent Vitals/Pain Pain Assessment: Faces Faces Pain Scale: Hurts whole lot Pain Location: L hip Pain Descriptors / Indicators: Discomfort;Dull;Grimacing     Hand Dominance     Extremity/Trunk Assessment Upper Extremity Assessment Upper Extremity Assessment: Overall  WFL for tasks assessed   Lower Extremity Assessment Lower Extremity Assessment: LLE deficits/detail LLE: Unable to fully assess due to pain       Communication Communication Communication: No difficulties   Cognition Arousal/Alertness: Awake/alert Behavior During Therapy: WFL for tasks assessed/performed Overall Cognitive Status: Within Functional Limits for tasks assessed                                        Exercises Exercises: Other exercises Other Exercises Other Exercises: Pt educated re: OT role, DME recs, falls prevention, pain mgmt Other Exercises: LBD, sit<>stand, sitting/standing balance/tolerance   Shoulder Instructions       Home Living Family/patient expects to be discharged to:: Private residence Living Arrangements: Alone (may have roommate)   Type of Home: House (Pt staes 3 houses combined? unclear) Home Access: Stairs to enter [employer] of Steps: 5 Entrance Stairs-Rails: None Home Layout: One level     Bathroom Shower/Tub: Walk-in shower         Home Equipment: Grab bars - tub/shower          Prior Functioning/Environment Level of Independence: Independent        Comments: Pt reports no AD use        OT Problem List: Decreased activity tolerance;Decreased range of motion;Impaired balance (sitting and/or standing);Decreased safety awareness      OT Treatment/Interventions: Self-care/ADL training;Therapeutic exercise;Energy conservation;DME and/or AE instruction;Therapeutic activities;Patient/family education;Balance training    OT Goals(Current goals can be found in the care plan section) Acute Rehab OT Goals Patient Stated Goal: To feel better OT Goal Formulation: With patient Time For Goal Achievement: 05/28/20 Potential to Achieve Goals: Good ADL Goals Pt Will Perform Grooming: standing;with supervision (c LRAD PRN) Pt Will Transfer to Toilet: with modified independence;ambulating;regular height toilet (c LRAD PRN) Additional ADL Goal #1: Pt will Independently verbalize plan to implement x3 falls prevention strategies.  OT Frequency: Min 1X/week   Barriers to D/C: Decreased caregiver support;Inaccessible home environment       AM-PAC OT "6 Clicks" Daily Activity     Outcome Measure Help from another person eating meals?: None Help from another person taking care of personal grooming?: A Little Help from another person toileting, which includes using toliet, bedpan, or urinal?: A Lot Help from another person bathing (including washing, rinsing, drying)?: A Lot Help from another person to put on and taking off regular upper body clothing?: A Little Help  from another person to put on and taking off regular lower body clothing?: A Little 6 Click Score: 17   End of Session Nurse Communication: Mobility status  Activity Tolerance: Patient tolerated treatment well Patient left: in bed;with call bell/phone within reach;Other (comment) ([place] in room end of session)  OT Visit Diagnosis: Other abnormalities of gait and mobility (R26.89)                Time: 8115-7262 OT Time Calculation (min): 8 min Charges:  OT General Charges $OT Visit: 1 Visit OT Evaluation $OT Eval Low Complexity: 1 Low  Dessie Coma, M.S. OTR/L  05/14/20, 12:43 PM  ascom [phone]

## 2020-05-14 NOTE — Consult Note (Signed)
Subjective:   CC: left inguinal hernia  HPI:  Kevin Baker is a 64 y.o. male who was referred by Damita Dunnings for evaluation of above cc.   Symptoms were first noted a few days ago. Pain is sharp.  Associated with nothing specific, exacerbated by movement.  Patient does not report an obvious lump in the area.    Past Medical History: None reported  Past Surgical History:  Past Surgical History:  Procedure Laterality Date  . LEG SURGERY Right     Family History: family history is not on file.  Social History:  reports that he has been smoking cigarettes. He has been smoking about 3.00 packs per day. He has never used smokeless tobacco. He reports current alcohol use. He reports previous drug use.  Current Medications:  Prior to Admission medications   Medication Sig Start Date End Date Taking? Authorizing Provider  baclofen (LIORESAL) 10 MG tablet Take 10 mg by mouth 3 (three) times daily as needed for muscle spasms. 11/21/18  Yes [provider]  buPROPion (WELLBUTRIN SR) 150 MG 12 hr tablet Take 150 mg by mouth daily. 11/21/18  Yes [provider]  Cholecalciferol 50 MCG (2000 UT) TABS Take 2,000 Units by mouth daily. 10/26/19  Yes [provider]  diclofenac Sodium (VOLTAREN) 1 % GEL Apply 2-4 g topically 4 (four) times daily as needed. 04/17/20  Yes [provider]  folic acid (FOLVITE) 1 MG tablet Take 1 mg by mouth daily. 11/29/18  Yes [provider]  hydrOXYzine (ATARAX/VISTARIL) 25 MG tablet Take 25 mg by mouth 3 (three) times daily as needed for anxiety. 12/15/18  Yes [provider]  hydrOXYzine (VISTARIL) 25 MG capsule Take 25 mg by mouth 4 (four) times daily as needed for anxiety. 08/09/19  Yes [provider]  lidocaine (LIDODERM) 5 % Place 1 patch onto the skin See admin instructions. Apply 1 patch to the skin once a day to painful areas for 12 hours, then remove. 12/07/18  Yes [provider]  naloxone (NARCAN) nasal  spray 4 mg/0.1 mL SPRAY 1 SPRAY INTO ONE NOSTRIL AS DIRECTED FOR OPIOID OVERDOSE (TURN PERSON ON SIDE AFTER DOSE. IF NO RESPONSE IN 2-3 MINUTES OR PERSON RESPONDS BUT RELAPSES, REPEAT USING A NEW SPRAY DEVICE AND SPRAY INTO THE OTHER NOSTRIL. CALL 911 AFTER USE.) * EMERGENCY USE ONLY * 11/01/19  Yes [provider]  naltrexone (DEPADE) 50 MG tablet Take 1 tablet by mouth daily. 12/15/18  Yes [provider]  nicotine polacrilex (COMMIT) 4 MG lozenge DISSOLVE 1 LOZENGE MOUTH EVERY HOUR AS NEEDED TO HELP STOP SMOKING ** CALL [facility] [phone] FOR INFORMATION ABOUT SUPPORT IN QUITTING.  - MAX 24 PIECES IN 24 HOURS TO HELP STOP SMOKING ** CALL [facility] [phone] FOR INFORMATION ABOUT SUPPORT IN QUITTING.  - MAX 24 PIECES IN 24 HOURS 11/21/18  Yes [provider]  omeprazole (PRILOSEC) 20 MG capsule Take 20 mg by mouth daily. 10/26/19  Yes [provider]  traZODone (DESYREL) 50 MG tablet Take 50 mg by mouth at bedtime as needed. 12/15/18  Yes [provider]  acetaminophen (TYLENOL) 500 MG tablet Take 1,000 mg by mouth 3 (three) times daily. Patient not taking: Reported on 05/14/2020 10/26/19   [provider]  nicotine (NICODERM CQ - DOSED IN MG/24 HOURS) 21 mg/24hr patch APPLY 1 PATCH TO SKIN ONCE A DAY *APPLY TO NON-HAIRY, CLEAN, DRY AREA (NO TOBACCO PRODUCTS)* Patient not taking: No sig reported 10/26/19  [provider]    Allergies:  Allergies as of 05/13/2020  . (No Known Allergies)    ROS:  General: Denies weight loss, weight gain, fatigue, fevers, chills, and night sweats. Eyes: Denies blurry vision, double vision, eye pain, itchy eyes, and tearing. Ears: Denies hearing loss, earache, and ringing in ears. Nose: Denies sinus pain, congestion, infections, runny nose, and nosebleeds. Mouth/throat: Denies hoarseness, sore throat, bleeding gums, and difficulty swallowing. Heart: Denies chest pain, palpitations,  racing heart, irregular heartbeat, leg pain or swelling, and decreased activity tolerance. Respiratory: Denies breathing difficulty, shortness of breath, wheezing, cough, and sputum. GI: Denies change in appetite, heartburn, nausea, vomiting, constipation, diarrhea, and blood in stool. GU: Denies difficulty urinating, pain with urinating, urgency, frequency, blood in urine. Musculoskeletal: Denies joint stiffness, pain, swelling, muscle weakness. Skin: Denies rash, itching, mass, tumors, sores, and boils Neurologic: Denies headache, fainting, dizziness, seizures, numbness, and tingling. Psychiatric: Denies depression, anxiety, difficulty sleeping, and memory loss. Endocrine: Denies heat or cold intolerance, and increased thirst or urination. Blood/lymph: Denies easy bruising, easy bruising, and swollen glands     Objective:     BP 121/79   Pulse 96   Temp 98 F (36.7 C) (Oral)   Resp 12   Ht 6' (1.829 m)   Wt 68 kg   SpO2 97%   BMI 20.34 kg/m   Constitutional :  alert, cooperative and distracted  Lymphatics/Throat:  no asymmetry, masses, or scars  Respiratory:  clear to auscultation bilaterally  Cardiovascular:  regular rate and rhythm  Gastrointestinal: soft, non-tender; bowel sounds normal; no masses,  no organomegaly. Left inguinal hernia noted.  Reducible, no TTP at time of exam.  Musculoskeletal: lying in bed, no obvious difficulty moving upper extremities  Skin: Cool and moist  Psychiatric: Normal affect, non-agitated, not confused       LABS:  CMP Latest Ref Rng & Units 05/14/2020 05/13/2020  Glucose 70 - 99 mg/dL 99 95  BUN 8 - 23 mg/dL 8 9  Creatinine 0.61 - 1.24 mg/dL 0.60(L) 0.58(L)  Sodium 135 - 145 mmol/L 138 137  Potassium 3.5 - 5.1 mmol/L 3.4(L) 3.6  Chloride 98 - 111 mmol/L 100 102  CO2 22 - 32 mmol/L 27 25  Calcium 8.9 - 10.3 mg/dL 9.2 8.9  Total Protein 6.5 - 8.1 g/dL - 6.9  Total Bilirubin 0.3 - 1.2 mg/dL - 0.8  Alkaline Phos 38 - 126 U/L - 50  AST  15 - 41 U/L - 17  ALT 0 - 44 U/L - 7   CBC Latest Ref Rng & Units 05/13/2020  WBC 4.0 - 10.5 K/uL 9.2  Hemoglobin 13.0 - 17.0 g/dL 11.1(L)  Hematocrit 39.0 - 52.0 % 32.3(L)  Platelets 150 - 400 K/uL 384    RADS: CLINICAL DATA:  Nausea, vomiting, abdominal pain, left groin pain  EXAM: CT ABDOMEN AND PELVIS WITHOUT CONTRAST  TECHNIQUE: Multidetector CT imaging of the abdomen and pelvis was performed following the standard protocol without IV contrast.  COMPARISON:  None.  FINDINGS: Lower chest: A lobulated mass is seen within the left lower lobe, suspicious for a primary bronchogenic neoplasm, not fully assessed on this examination. The visualized right lung base is clear. Extensive right coronary artery calcification. Global cardiac size within normal limits. No pericardial effusion.  Hepatobiliary: Layering gallstones are seen within the gallbladder lumen. The gallbladder is not distended and no pericholecystic inflammatory changes identified, however. The liver is unremarkable. No intra or extrahepatic biliary ductal dilation.  Pancreas: Unremarkable  Spleen: Unremarkable  Adrenals/Urinary Tract: The adrenal glands are unremarkable. The kidneys are normal. The bladder is diffusely, mildly thick walled suggesting changes of chronic bladder outlet obstruction. The bladder, however, is not distended.  Stomach/Bowel: Stomach is within normal limits. Appendix appears normal. No evidence of bowel wall thickening, distention, or inflammatory changes. No free intraperitoneal gas or fluid.  Vascular/Lymphatic: Moderate aortoiliac atherosclerotic calcification. No aortic aneurysm. No pathologic adenopathy within the abdomen and pelvis.  Reproductive: Mild prostatic enlargement. Seminal vesicles are unremarkable.  Other: Tiny fat containing left inguinal hernia. Rectum unremarkable.  Musculoskeletal: Remote fracture deformities are noted of the body of the  left ilium as well as the left superior and inferior pubic rami. Advanced degenerative changes are seen at the lumbosacral junction with grade 1 anterolisthesis of L4 upon L5 and L5 upon S1 to a lesser extent.  IMPRESSION: Lobulated mass within the left lung base, not fully assessed on this examination, suspicious or primary bronchogenic neoplasm. Contrast enhanced CT examination of the chest is recommended for definitive evaluation of the chest once the patient's acute issues have resolved.  No acute intra-abdominal pathology identified. No definite radiographic explanation for the patient's reported symptoms.  Cholelithiasis  Tiny fat containing left inguinal hernia.  Aortic Atherosclerosis (ICD10-I70.0).   Electronically Signed   By: Fidela Salisbury MD   On: 05/14/2020 01:04  Assessment:   Left inguinal hernia Hx of alcohol abuse CHF? Plan:   Left inguinal hernia is small, reducible, no tenderness to palpation at time of exam.  She did complaining of some pain in the left hip area when he was moving around in the bed after the exam, suspect pain may be chronic and joint related but rather than the hernia that was noted on CT scan.  Recommend supportive care for now until all his medical issues are resolved.  Recommended to patient that we can consider surgical repair as an outpatient basis if he would wish.  He verbalized understanding.  Further care and work-up for his other issues per hospitalist team.  Surgery will be signing off at this point.  Please call with any new questions or concerns.

## 2020-05-14 NOTE — Progress Notes (Signed)
*  PRELIMINARY RESULTS* Echocardiogram 2D Echocardiogram has been performed.  Kevin Baker 05/14/2020, 9:32 AM

## 2020-05-14 NOTE — ED Notes (Signed)
Pt showing signs of withdrawal. CIWA orders in place. See MAR for intervention. Pt is A&O x4 at this time. VS taken.

## 2020-05-15 LAB — HIV ANTIBODY (ROUTINE TESTING W REFLEX): HIV Screen 4th Generation wRfx: NONREACTIVE

## 2020-05-16 DIAGNOSIS — M47816 Spondylosis without myelopathy or radiculopathy, lumbar region: Secondary | ICD-10-CM | POA: Diagnosis not present

## 2020-05-16 DIAGNOSIS — R32 Unspecified urinary incontinence: Secondary | ICD-10-CM | POA: Diagnosis not present

## 2020-05-16 DIAGNOSIS — M1652 Unilateral post-traumatic osteoarthritis, left hip: Secondary | ICD-10-CM | POA: Diagnosis not present

## 2020-05-16 DIAGNOSIS — Z9181 History of falling: Secondary | ICD-10-CM | POA: Diagnosis not present

## 2020-05-21 DIAGNOSIS — M1652 Unilateral post-traumatic osteoarthritis, left hip: Secondary | ICD-10-CM | POA: Diagnosis not present

## 2020-05-21 DIAGNOSIS — Z9181 History of falling: Secondary | ICD-10-CM | POA: Diagnosis not present

## 2020-05-21 DIAGNOSIS — R32 Unspecified urinary incontinence: Secondary | ICD-10-CM | POA: Diagnosis not present

## 2020-05-21 DIAGNOSIS — M47816 Spondylosis without myelopathy or radiculopathy, lumbar region: Secondary | ICD-10-CM | POA: Diagnosis not present

## 2020-05-22 DIAGNOSIS — R32 Unspecified urinary incontinence: Secondary | ICD-10-CM | POA: Diagnosis not present

## 2020-05-22 DIAGNOSIS — M1652 Unilateral post-traumatic osteoarthritis, left hip: Secondary | ICD-10-CM | POA: Diagnosis not present

## 2020-05-22 DIAGNOSIS — Z9181 History of falling: Secondary | ICD-10-CM | POA: Diagnosis not present

## 2020-05-22 DIAGNOSIS — M47816 Spondylosis without myelopathy or radiculopathy, lumbar region: Secondary | ICD-10-CM | POA: Diagnosis not present

## 2020-05-23 DIAGNOSIS — M47816 Spondylosis without myelopathy or radiculopathy, lumbar region: Secondary | ICD-10-CM | POA: Diagnosis not present

## 2020-05-23 DIAGNOSIS — M1652 Unilateral post-traumatic osteoarthritis, left hip: Secondary | ICD-10-CM | POA: Diagnosis not present

## 2020-05-23 DIAGNOSIS — R32 Unspecified urinary incontinence: Secondary | ICD-10-CM | POA: Diagnosis not present

## 2020-05-23 DIAGNOSIS — Z9181 History of falling: Secondary | ICD-10-CM | POA: Diagnosis not present

## 2020-05-25 DIAGNOSIS — M1652 Unilateral post-traumatic osteoarthritis, left hip: Secondary | ICD-10-CM | POA: Diagnosis not present

## 2020-05-25 DIAGNOSIS — Z9181 History of falling: Secondary | ICD-10-CM | POA: Diagnosis not present

## 2020-05-25 DIAGNOSIS — M47816 Spondylosis without myelopathy or radiculopathy, lumbar region: Secondary | ICD-10-CM | POA: Diagnosis not present

## 2020-05-25 DIAGNOSIS — R32 Unspecified urinary incontinence: Secondary | ICD-10-CM | POA: Diagnosis not present

## 2020-05-29 DIAGNOSIS — Z9181 History of falling: Secondary | ICD-10-CM | POA: Diagnosis not present

## 2020-05-29 DIAGNOSIS — R32 Unspecified urinary incontinence: Secondary | ICD-10-CM | POA: Diagnosis not present

## 2020-05-29 DIAGNOSIS — M47816 Spondylosis without myelopathy or radiculopathy, lumbar region: Secondary | ICD-10-CM | POA: Diagnosis not present

## 2020-05-29 DIAGNOSIS — M1652 Unilateral post-traumatic osteoarthritis, left hip: Secondary | ICD-10-CM | POA: Diagnosis not present

## 2020-05-31 DIAGNOSIS — Z9181 History of falling: Secondary | ICD-10-CM | POA: Diagnosis not present

## 2020-05-31 DIAGNOSIS — R32 Unspecified urinary incontinence: Secondary | ICD-10-CM | POA: Diagnosis not present

## 2020-05-31 DIAGNOSIS — M1652 Unilateral post-traumatic osteoarthritis, left hip: Secondary | ICD-10-CM | POA: Diagnosis not present

## 2020-05-31 DIAGNOSIS — M47816 Spondylosis without myelopathy or radiculopathy, lumbar region: Secondary | ICD-10-CM | POA: Diagnosis not present

## 2020-06-06 DIAGNOSIS — M47816 Spondylosis without myelopathy or radiculopathy, lumbar region: Secondary | ICD-10-CM | POA: Diagnosis not present

## 2020-06-06 DIAGNOSIS — Z9181 History of falling: Secondary | ICD-10-CM | POA: Diagnosis not present

## 2020-06-06 DIAGNOSIS — R32 Unspecified urinary incontinence: Secondary | ICD-10-CM | POA: Diagnosis not present

## 2020-06-06 DIAGNOSIS — M1652 Unilateral post-traumatic osteoarthritis, left hip: Secondary | ICD-10-CM | POA: Diagnosis not present

## 2020-06-11 DIAGNOSIS — M1652 Unilateral post-traumatic osteoarthritis, left hip: Secondary | ICD-10-CM | POA: Diagnosis not present

## 2020-06-11 DIAGNOSIS — M47816 Spondylosis without myelopathy or radiculopathy, lumbar region: Secondary | ICD-10-CM | POA: Diagnosis not present

## 2020-06-11 DIAGNOSIS — Z9181 History of falling: Secondary | ICD-10-CM | POA: Diagnosis not present

## 2020-06-11 DIAGNOSIS — R32 Unspecified urinary incontinence: Secondary | ICD-10-CM | POA: Diagnosis not present

## 2020-06-19 DIAGNOSIS — M25552 Pain in left hip: Secondary | ICD-10-CM | POA: Diagnosis not present

## 2020-06-19 DIAGNOSIS — R29898 Other symptoms and signs involving the musculoskeletal system: Secondary | ICD-10-CM | POA: Diagnosis not present

## 2020-06-20 DIAGNOSIS — Z9181 History of falling: Secondary | ICD-10-CM | POA: Diagnosis not present

## 2020-06-20 DIAGNOSIS — M47816 Spondylosis without myelopathy or radiculopathy, lumbar region: Secondary | ICD-10-CM | POA: Diagnosis not present

## 2020-06-20 DIAGNOSIS — M1652 Unilateral post-traumatic osteoarthritis, left hip: Secondary | ICD-10-CM | POA: Diagnosis not present

## 2020-06-20 DIAGNOSIS — R32 Unspecified urinary incontinence: Secondary | ICD-10-CM | POA: Diagnosis not present

## 2020-07-09 ENCOUNTER — Inpatient Hospital Stay
Admission: EM | Admit: 2020-07-09 | Discharge: 2020-07-19 | DRG: 180 | Disposition: A | Discharge status: Home Health | Payer: Medicare HMO | Attending: Family Medicine | Admitting: Family Medicine

## 2020-07-09 ENCOUNTER — Emergency Department: Payer: Medicare HMO

## 2020-07-09 ENCOUNTER — Other Ambulatory Visit: Payer: Self-pay

## 2020-07-09 DIAGNOSIS — C3412 Malignant neoplasm of upper lobe, left bronchus or lung: Secondary | ICD-10-CM

## 2020-07-09 DIAGNOSIS — M79605 Pain in left leg: Secondary | ICD-10-CM | POA: Diagnosis not present

## 2020-07-09 DIAGNOSIS — E876 Hypokalemia: Secondary | ICD-10-CM | POA: Diagnosis present

## 2020-07-09 DIAGNOSIS — Z8249 Family history of ischemic heart disease and other diseases of the circulatory system: Secondary | ICD-10-CM | POA: Diagnosis not present

## 2020-07-09 DIAGNOSIS — C3490 Malignant neoplasm of unspecified part of unspecified bronchus or lung: Secondary | ICD-10-CM

## 2020-07-09 DIAGNOSIS — I2510 Atherosclerotic heart disease of native coronary artery without angina pectoris: Secondary | ICD-10-CM | POA: Diagnosis not present

## 2020-07-09 DIAGNOSIS — F1011 Alcohol abuse, in remission: Secondary | ICD-10-CM | POA: Diagnosis present

## 2020-07-09 DIAGNOSIS — J432 Centrilobular emphysema: Secondary | ICD-10-CM | POA: Diagnosis not present

## 2020-07-09 DIAGNOSIS — C3492 Malignant neoplasm of unspecified part of left bronchus or lung: Secondary | ICD-10-CM

## 2020-07-09 DIAGNOSIS — M958 Other specified acquired deformities of musculoskeletal system: Secondary | ICD-10-CM | POA: Diagnosis not present

## 2020-07-09 DIAGNOSIS — E43 Unspecified severe protein-calorie malnutrition: Secondary | ICD-10-CM | POA: Diagnosis present

## 2020-07-09 DIAGNOSIS — R64 Cachexia: Secondary | ICD-10-CM | POA: Diagnosis present

## 2020-07-09 DIAGNOSIS — D649 Anemia, unspecified: Secondary | ICD-10-CM | POA: Diagnosis not present

## 2020-07-09 DIAGNOSIS — I700 Atherosclerosis of aorta: Secondary | ICD-10-CM | POA: Diagnosis present

## 2020-07-09 DIAGNOSIS — M898X5 Other specified disorders of bone, thigh: Secondary | ICD-10-CM | POA: Diagnosis not present

## 2020-07-09 DIAGNOSIS — J439 Emphysema, unspecified: Secondary | ICD-10-CM | POA: Diagnosis present

## 2020-07-09 DIAGNOSIS — M4316 Spondylolisthesis, lumbar region: Secondary | ICD-10-CM | POA: Diagnosis present

## 2020-07-09 DIAGNOSIS — G8929 Other chronic pain: Secondary | ICD-10-CM | POA: Diagnosis not present

## 2020-07-09 DIAGNOSIS — M1612 Unilateral primary osteoarthritis, left hip: Secondary | ICD-10-CM | POA: Diagnosis present

## 2020-07-09 DIAGNOSIS — C3431 Malignant neoplasm of lower lobe, right bronchus or lung: Secondary | ICD-10-CM | POA: Diagnosis not present

## 2020-07-09 DIAGNOSIS — E739 Lactose intolerance, unspecified: Secondary | ICD-10-CM | POA: Diagnosis present

## 2020-07-09 DIAGNOSIS — Z885 Allergy status to narcotic agent status: Secondary | ICD-10-CM | POA: Diagnosis not present

## 2020-07-09 DIAGNOSIS — C7951 Secondary malignant neoplasm of bone: Secondary | ICD-10-CM | POA: Diagnosis present

## 2020-07-09 DIAGNOSIS — Z79891 Long term (current) use of opiate analgesic: Secondary | ICD-10-CM | POA: Diagnosis not present

## 2020-07-09 DIAGNOSIS — Z681 Body mass index (BMI) 19 or less, adult: Secondary | ICD-10-CM

## 2020-07-09 DIAGNOSIS — G893 Neoplasm related pain (acute) (chronic): Secondary | ICD-10-CM | POA: Diagnosis present

## 2020-07-09 DIAGNOSIS — Z20822 Contact with and (suspected) exposure to covid-19: Secondary | ICD-10-CM | POA: Diagnosis present

## 2020-07-09 DIAGNOSIS — D638 Anemia in other chronic diseases classified elsewhere: Secondary | ICD-10-CM | POA: Diagnosis present

## 2020-07-09 DIAGNOSIS — B1920 Unspecified viral hepatitis C without hepatic coma: Secondary | ICD-10-CM | POA: Diagnosis present

## 2020-07-09 DIAGNOSIS — F17210 Nicotine dependence, cigarettes, uncomplicated: Secondary | ICD-10-CM | POA: Diagnosis present

## 2020-07-09 DIAGNOSIS — R0902 Hypoxemia: Secondary | ICD-10-CM | POA: Diagnosis present

## 2020-07-09 DIAGNOSIS — C3432 Malignant neoplasm of lower lobe, left bronchus or lung: Secondary | ICD-10-CM | POA: Diagnosis present

## 2020-07-09 DIAGNOSIS — Z515 Encounter for palliative care: Secondary | ICD-10-CM | POA: Diagnosis not present

## 2020-07-09 DIAGNOSIS — K4090 Unilateral inguinal hernia, without obstruction or gangrene, not specified as recurrent: Secondary | ICD-10-CM | POA: Diagnosis not present

## 2020-07-09 DIAGNOSIS — Z7952 Long term (current) use of systemic steroids: Secondary | ICD-10-CM | POA: Diagnosis not present

## 2020-07-09 DIAGNOSIS — R918 Other nonspecific abnormal finding of lung field: Secondary | ICD-10-CM | POA: Diagnosis present

## 2020-07-09 DIAGNOSIS — R296 Repeated falls: Secondary | ICD-10-CM | POA: Diagnosis present

## 2020-07-09 DIAGNOSIS — M533 Sacrococcygeal disorders, not elsewhere classified: Secondary | ICD-10-CM | POA: Diagnosis not present

## 2020-07-09 DIAGNOSIS — R531 Weakness: Secondary | ICD-10-CM | POA: Diagnosis not present

## 2020-07-09 DIAGNOSIS — Z882 Allergy status to sulfonamides status: Secondary | ICD-10-CM | POA: Diagnosis not present

## 2020-07-09 DIAGNOSIS — M21952 Unspecified acquired deformity of left thigh: Secondary | ICD-10-CM | POA: Diagnosis not present

## 2020-07-09 DIAGNOSIS — K2950 Unspecified chronic gastritis without bleeding: Secondary | ICD-10-CM | POA: Diagnosis present

## 2020-07-09 DIAGNOSIS — M25552 Pain in left hip: Secondary | ICD-10-CM | POA: Diagnosis present

## 2020-07-09 DIAGNOSIS — R9431 Abnormal electrocardiogram [ECG] [EKG]: Secondary | ICD-10-CM | POA: Diagnosis not present

## 2020-07-09 DIAGNOSIS — I509 Heart failure, unspecified: Secondary | ICD-10-CM | POA: Diagnosis not present

## 2020-07-09 DIAGNOSIS — M545 Low back pain, unspecified: Secondary | ICD-10-CM | POA: Diagnosis not present

## 2020-07-09 DIAGNOSIS — R911 Solitary pulmonary nodule: Secondary | ICD-10-CM | POA: Diagnosis not present

## 2020-07-09 DIAGNOSIS — M4328 Fusion of spine, sacral and sacrococcygeal region: Secondary | ICD-10-CM | POA: Diagnosis not present

## 2020-07-09 HISTORY — DX: Alcohol dependence, uncomplicated: F10.20

## 2020-07-09 HISTORY — DX: Other nonspecific abnormal finding of lung field: R91.8

## 2020-07-09 LAB — URINALYSIS, COMPLETE (UACMP) WITH MICROSCOPIC
Bacteria, UA: NONE SEEN
Bilirubin Urine: NEGATIVE
Glucose, UA: NEGATIVE mg/dL
Hgb urine dipstick: NEGATIVE
Ketones, ur: 5 mg/dL — AB
Nitrite: NEGATIVE
Protein, ur: NEGATIVE mg/dL
Specific Gravity, Urine: 1.025 (ref 1.005–1.030)
pH: 5 (ref 5.0–8.0)

## 2020-07-09 LAB — HEPATIC FUNCTION PANEL
ALT: 16 U/L (ref 0–44)
AST: 19 U/L (ref 15–41)
Albumin: 3.4 g/dL — ABNORMAL LOW (ref 3.5–5.0)
Alkaline Phosphatase: 66 U/L (ref 38–126)
Bilirubin, Direct: <0.1 mg/dL (ref 0.0–0.2)
Total Bilirubin: 0.5 mg/dL (ref 0.3–1.2)
Total Protein: 7.2 g/dL (ref 6.5–8.1)

## 2020-07-09 LAB — TROPONIN I (HIGH SENSITIVITY): Troponin I (High Sensitivity): 6 ng/L (ref <18)

## 2020-07-09 LAB — CBC
HCT: 28.7 % — ABNORMAL LOW (ref 39.0–52.0)
Hemoglobin: 9.9 g/dL — ABNORMAL LOW (ref 13.0–17.0)
MCH: 33.4 pg (ref 26.0–34.0)
MCHC: 34.5 g/dL (ref 30.0–36.0)
MCV: 97 fL (ref 80.0–100.0)
Platelets: 509 10*3/uL — ABNORMAL HIGH (ref 150–400)
RBC: 2.96 MIL/uL — ABNORMAL LOW (ref 4.22–5.81)
RDW: 13 % (ref 11.5–15.5)
WBC: 8.6 10*3/uL (ref 4.0–10.5)
nRBC: 0 % (ref 0.0–0.2)

## 2020-07-09 LAB — BASIC METABOLIC PANEL
Anion gap: 10 (ref 5–15)
BUN: 26 mg/dL — ABNORMAL HIGH (ref 8–23)
CO2: 25 mmol/L (ref 22–32)
Calcium: 9.4 mg/dL (ref 8.9–10.3)
Chloride: 98 mmol/L (ref 98–111)
Creatinine, Ser: 1.03 mg/dL (ref 0.61–1.24)
GFR, Estimated: >60 mL/min (ref >60)
Glucose, Bld: 103 mg/dL — ABNORMAL HIGH (ref 70–99)
Potassium: 3 mmol/L — ABNORMAL LOW (ref 3.5–5.1)
Sodium: 133 mmol/L — ABNORMAL LOW (ref 135–145)

## 2020-07-09 LAB — LIPASE, BLOOD: Lipase: 39 U/L (ref 11–51)

## 2020-07-09 MED ORDER — SODIUM CHLORIDE 0.9 % IV BOLUS
1000.0000 mL | Freq: Once | INTRAVENOUS | Status: AC
  Administered 2020-07-09: 1000 mL via INTRAVENOUS

## 2020-07-09 MED ORDER — OXYCODONE-ACETAMINOPHEN 5-325 MG PO TABS
1.0000 | ORAL_TABLET | Freq: Once | ORAL | Status: AC
  Administered 2020-07-09: 1 via ORAL
  Filled 2020-07-09 (×3): qty 1

## 2020-07-09 NOTE — ED Notes (Signed)
Pt awake and alert-- frequently observed rambling and speech difficulty to understand at times; no teeth noted upon visual exam.  Pt reports ongoing chronic LLE pain since 07/2019 --described as "shock" like and burning pain -- limited mobility due to pain and pt requires assistance with ADLs.  RR even and unlabored on RA at this time with symmetrical rise and fall of chest.

## 2020-07-09 NOTE — ED Notes (Addendum)
Dr Cherylann Banas advising ok to give pt Percocet at this time; provider aware pt remains hypotensive 92/67 (mAP 75) =-- Pt has also had bowel episode on bedpan; mostly liquid brown stool.   Perineal care performed and pt placed on clean chux and provided fresh linen.  Pt states he is often incontinent of stool at home.

## 2020-07-09 NOTE — ED Notes (Signed)
Pt now returned from CT dept via stretcher; no acute changes.   

## 2020-07-09 NOTE — ED Notes (Signed)
Pt to CT dept via stretcher

## 2020-07-09 NOTE — H&P (Signed)
History and Physical    Kevin Baker UUV:[id number] DOB: 1955-09-15 DOA: 07/09/2020  PCP: Patient, No Pcp Per  Patient coming from: Home  I have personally briefly reviewed patient's old medical records in [place]  Chief Complaint: Worsening left hip pain, inability to ambulate, frequent falls  HPI: Kevin Baker is a 65 y.o. 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  Patient was last seen in the hospital for planned admission 05/13/2020 for similar symptoms.  He was found to have a left inguinal hernia which was felt to be reducible and not incarcerated.  He was seen by general surgery who felt no surgical intervention warranted.  Imaging also showed a left lower lobe lung mass concerning for primary bronchogenic carcinoma.  Per documentation, he was exhibiting early signs of alcohol withdrawal the following day and ultimately he left AGAINST MEDICAL ADVICE on 05/14/2020.  He has been seen by orthopedics, Dr. Rudene Christians, as an outpatient on 06/19/2020 at which time it was felt that his pain was all nerve pain.  He was given a prescription for Neurontin and recommended an EMG nerve conduction study for further evaluation.  He was referred to neurology but has not followed up.  Patient returned to the ED with progressive worsening pain in his left lower extremity which she says goes from his left knee up to his left hip.  He has had significant difficulty ambulating resulting in frequent falls.  He also reports diarrhea ongoing for the last 2 months.  He says he quit drinking almost 2 months ago and has cut back on his smoking from 3 packs/day down to 1-1/2 packs/week.  ED Course:  Initial vitals showed BP 93/64, pulse 77, RR 17,  temp 98.3 F, SPO2 99% on room air.  Labs show sodium 133, potassium 3.0, chloride 98, bicarb 25, BUN 26, creatinine 1.03, serum glucose 103, WBC 8.6, hemoglobin 9.9, MCV 97.0, platelets 509,000, LFTs within normal limits, lipase 39, troponin 6.  SARS-CoV-2 PCR panel is ordered and pending.  Left hip x-ray shows chronic deformity of left iliac bone and pubic rami without acute displaced fracture or malalignment seen.  CT left hip shows an ovoid lucent lesion centered within the left lesser trochanter measuring approximately 2.6 x 2.3 x 3.1 cm in size concerning for metastatic lesion given no left lung mass.  Remote posttraumatic changes of the left pubic root, superior and inferior rami and left ilium are unchanged from prior CT.  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  Fluid-filled appearance of the colon also seen.  Patient was given 1 L normal saline and the hospitalist service was consulted to admit for further evaluation and management.  Review of Systems: All systems reviewed and are negative except as documented in history of present illness above.   Past Medical History:  Diagnosis Date  . Alcohol use disorder, severe, dependence ([place])   . Mass of lower lobe of left lung     Past Surgical History:  Procedure Laterality Date  . BACK SURGERY    . LEG SURGERY Right   . PELVIC FRACTURE SURGERY      Social History:  reports that he has been smoking cigarettes. He has been smoking about 3.00 packs per day. He has never used smokeless  tobacco. He reports previous alcohol use. He reports previous drug use.  Allergies  Allergen Reactions  . Codeine Nausea And Vomiting  . Sulfamethoxazole-Trimethoprim Nausea And Vomiting    Family History  Problem Relation Age of Onset  . Heart disease Father      Prior to Admission medications   Medication Sig Start Date End Date Taking? Authorizing Provider  acetaminophen (TYLENOL) 500 MG tablet Take 1,000 mg by mouth 3 (three) times  daily. Patient not taking: Reported on 05/14/2020 10/26/19   [provider]  baclofen (LIORESAL) 10 MG tablet Take 10 mg by mouth 3 (three) times daily as needed for muscle spasms. 11/21/18   [provider]  buPROPion (WELLBUTRIN SR) 150 MG 12 hr tablet Take 150 mg by mouth daily. 11/21/18   [provider]  Cholecalciferol 50 MCG (2000 UT) TABS Take 2,000 Units by mouth daily. 10/26/19   [provider]  diclofenac Sodium (VOLTAREN) 1 % GEL Apply 2-4 g topically 4 (four) times daily as needed. 04/17/20   [provider]  folic acid (FOLVITE) 1 MG tablet Take 1 mg by mouth daily. 11/29/18   [provider]  hydrOXYzine (ATARAX/VISTARIL) 25 MG tablet Take 25 mg by mouth 3 (three) times daily as needed for anxiety. 12/15/18   [provider]  hydrOXYzine (VISTARIL) 25 MG capsule Take 25 mg by mouth 4 (four) times daily as needed for anxiety. 08/09/19   [provider]  lidocaine (LIDODERM) 5 % Place 1 patch onto the skin See admin instructions. Apply 1 patch to the skin once a day to painful areas for 12 hours, then remove. 12/07/18   [provider]  naloxone (NARCAN) nasal spray 4 mg/0.1 mL SPRAY 1 SPRAY INTO ONE NOSTRIL AS DIRECTED FOR OPIOID OVERDOSE (TURN PERSON ON SIDE AFTER DOSE. IF NO RESPONSE IN 2-3 MINUTES OR PERSON RESPONDS BUT RELAPSES, REPEAT USING A NEW SPRAY DEVICE AND SPRAY INTO THE OTHER NOSTRIL. CALL 911 AFTER USE.) * EMERGENCY USE ONLY * 11/01/19   [provider]  naltrexone (DEPADE) 50 MG tablet Take 1 tablet by mouth daily. 12/15/18   [provider]  nicotine (NICODERM CQ - DOSED IN MG/24 HOURS) 21 mg/24hr patch APPLY 1 PATCH TO SKIN ONCE A DAY *APPLY TO NON-HAIRY, CLEAN, DRY AREA (NO TOBACCO PRODUCTS)* Patient not taking: No sig reported 10/26/19   [provider]  nicotine polacrilex (COMMIT) 4 MG lozenge DISSOLVE 1 LOZENGE MOUTH EVERY HOUR AS NEEDED TO HELP STOP SMOKING ** CALL  [facility] [phone] FOR INFORMATION ABOUT SUPPORT IN QUITTING.  - MAX 24 PIECES IN 24 HOURS TO HELP STOP SMOKING ** CALL [facility] [phone] FOR INFORMATION ABOUT SUPPORT IN QUITTING.  - MAX 24 PIECES IN 24 HOURS 11/21/18   [provider]  omeprazole (PRILOSEC) 20 MG capsule Take 20 mg by mouth daily. 10/26/19   [provider]  traZODone (DESYREL) 50 MG tablet Take 50 mg by mouth at bedtime as needed. 12/15/18   [provider]    Physical Exam: Vitals:   07/09/20 2106 07/09/20 2109 07/09/20 2300 07/10/20 0032  BP: 94/60 (!) 92/55 92/67 (!) 96/54  Pulse: 70  74 74  Resp: 18  (!) 22 16  Temp: 98.5 F (36.9 C)   98.4 F (36.9 C)  TempSrc: Oral   Oral  SpO2: 98%  95% 96%  Weight:      Height:       Constitutional: Resting in bed in the left  lateral decubitus position NAD, calm, comfortable Eyes: PERRL, lids and conjunctivae normal ENMT: Mucous membranes are moist. Posterior pharynx clear of any exudate or lesions.Normal dentition.  Neck: normal, supple, no masses. Respiratory: clear to auscultation bilaterally, no wheezing, no crackles. Normal respiratory effort. No accessory muscle use.  Cardiovascular: Regular rate and rhythm, no murmurs / rubs / gallops. No extremity edema. 2+ pedal pulses. Abdomen: no tenderness, no masses palpated. No hepatosplenomegaly. Bowel sounds positive.  Musculoskeletal: no clubbing / cyanosis. No joint deformity upper and lower extremities.  Limited ROM at left hip due to pain, no contractures. Normal muscle tone.  Skin: no rashes, lesions, ulcers. No induration Neurologic: CN 2-12 grossly intact. Sensation intact. Strength 5/5 in all 4 but somewhat limited LLE due to pain.  Psychiatric:  Alert and oriented x 3. Normal mood.     Labs on Admission: I have personally reviewed following labs and imaging studies  CBC: Recent Labs  Lab 07/09/20 1513  WBC 8.6  HGB 9.9*  HCT 28.7*  MCV 97.0  PLT 509*   Basic  Metabolic Panel: Recent Labs  Lab 07/09/20 1513 07/09/20 2313  NA 133*  --   K 3.0*  --   CL 98  --   CO2 25  --   GLUCOSE 103*  --   BUN 26*  --   CREATININE 1.03  --   CALCIUM 9.4  --   MG  --  1.1*   GFR: Estimated Creatinine Clearance: 68.8 mL/min (by C-G formula based on SCr of 1.03 mg/dL). Liver Function Tests: Recent Labs  Lab 07/09/20 2120  AST 19  ALT 16  ALKPHOS 66  BILITOT 0.5  PROT 7.2  ALBUMIN 3.4*   Recent Labs  Lab 07/09/20 2120  LIPASE 39   No results for input(s): AMMONIA in the last 168 hours. Coagulation Profile: No results for input(s): INR, PROTIME in the last 168 hours. Cardiac Enzymes: No results for input(s): CKTOTAL, CKMB, CKMBINDEX, TROPONINI in the last 168 hours. BNP (last 3 results) No results for input(s): PROBNP in the last 8760 hours. HbA1C: No results for input(s): HGBA1C in the last 72 hours. CBG: No results for input(s): GLUCAP in the last 168 hours. Lipid Profile: No results for input(s): CHOL, HDL, LDLCALC, TRIG, CHOLHDL, LDLDIRECT in the last 72 hours. Thyroid Function Tests: No results for input(s): TSH, T4TOTAL, FREET4, T3FREE, THYROIDAB in the last 72 hours. Anemia Panel: No results for input(s): VITAMINB12, FOLATE, FERRITIN, TIBC, IRON, RETICCTPCT in the last 72 hours. Urine analysis:    Component Value Date/Time   COLORURINE YELLOW (A) 07/09/2020 2120   APPEARANCEUR HAZY (A) 07/09/2020 2120   LABSPEC 1.025 07/09/2020 2120   PHURINE 5.0 07/09/2020 2120   GLUCOSEU NEGATIVE 07/09/2020 2120   HGBUR NEGATIVE 07/09/2020 2120   [place] NEGATIVE 07/09/2020 2120   KETONESUR 5 (A) 07/09/2020 2120   PROTEINUR NEGATIVE 07/09/2020 2120   NITRITE NEGATIVE 07/09/2020 2120   LEUKOCYTESUR SMALL (A) 07/09/2020 2120    Radiological Exams on Admission: CT Hip Left Wo Contrast  Result Date: 07/10/2020 CLINICAL DATA:  Left leg and arm weakness with pain since March 2021, negative radiographs EXAM: CT OF THE LEFT HIP WITHOUT  CONTRAST TECHNIQUE: Multidetector CT imaging of the left hip was performed according to the standard protocol. Multiplanar CT image reconstructions were also generated. COMPARISON:  Radiograph 07/09/2020, CT 05/14/2020 FINDINGS: Bones/Joint/Cartilage The osseous structures appear diffusely demineralized which may limit detection of small or nondisplaced fractures. Ovoid lucent lesion centered within the lesser  trochanter measuring approximately 2.6 x 2.3 x 3.1 cm in size. No clear associated pathologic fracture is evident at this time. No other suspicious lytic or blastic lesions within the included margins of imaging. No other acute bony abnormality. Specifically, no fracture, subluxation, or dislocation of the left hip or included bony pelvis. More remote remote posttraumatic changes of the left pubic root, superior and inferior rami, unchanged from comparison CT. Ankylosis left SI joint and likely prior traumatic deformity of the left ilium. Degenerative change at the pubic symphysis and left hip is similar to comparison. No sizable hip effusion. Ligaments Suboptimally assessed by CT. Muscles and Tendons No acute musculotendinous abnormality is seen. Soft tissues Atherosclerotic calcifications in the iliac arteries and proximal common and superficial femoral arteries. Circumferential thickening of the urinary bladder though possibly related to underdistention. Fluid-filled appearance of the colon, correlate for rapid transit state. IMPRESSION: 1. Ovoid lucent lesion centered within the left lesser trochanter measuring approximately 2.6 x 2.3 x 3.1 cm in size. No clear associated pathologic fracture is evident at this time. Findings concerning for metastatic lesion given known left lung lesion. 2. Remote posttraumatic changes of the left pubic root, superior and inferior rami, and left ilium unchanged from comparison CT. 3. Ankylosis left SI joint. 4. Circumferential thickening of the urinary bladder though  possibly related to underdistention. Correlate with urinalysis to exclude cystitis. 5. Fluid-filled appearance of the colon, could reflect diarrheal illness/rapid transit state. 6. Atherosclerosis These results were called by telephone at the time of interpretation on 07/10/2020 at 12:23 am to provider Dr. Tamala Julian, who verbally acknowledged these results. Electronically Signed   By: Lovena Le M.D.   On: 07/10/2020 00:23   DG Hip Unilat W or Wo Pelvis 2-3 Views Left  Result Date: 07/09/2020 CLINICAL DATA:  Acute on chronic hip pain EXAM: DG HIP (WITH OR WITHOUT PELVIS) 2-3V LEFT COMPARISON:  05/13/2020 FINDINGS: Chronic deformity of left iliac bone and pubic rami. No acute displaced fracture or malalignment is seen. There are mild degenerative changes of both hips. Vascular calcifications. IMPRESSION: No acute osseous abnormality. Chronic deformity of left iliac bone and pubic rami. Electronically Signed   By: Donavan Foil M.D.   On: 07/09/2020 21:25    EKG: Personally reviewed. Normal sinus rhythm without acute ischemic changes.  Assessment/Plan Principal Problem:   Acute on chronic pain of left hip Active Problems:   Mass of lower lobe of left lung   Hypokalemia   Hypomagnesemia   Normocytic anemia   Kevin Baker is a 65 y.o. 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  Acute on chronic left hip pain with generalized weakness and frequent falls Degenerative joint disease of the left hip: CT imaging of the left hip concerning for metastatic lesion at the left lesser trochanter.  Also shows remote posttraumatic changes of the left pubic root, superior and inferior rami, left ilium and ankylosis at the left SI joint. -Pain  control as needed -PT/OT eval  Left lower lobe lung mass: 4.6 x 3.8 spiculated mass seen at the left lower lobe on CT imaging 05/14/2020 concerning for primary bronchogenic carcinoma.  Has not had outpatient follow-up for this yet.  Discussed findings with patient. -Will need pulmonology and oncology consultations  Alcohol use  disorder: Reports he quit drinking about 2 months ago.  Not currently exhibiting any signs of withdrawal.  Continue CIWA checks without Ativan for now.  Normocytic anemia: Without obvious bleeding.  Check anemia panel.  Hypokalemia/hypomagnesemia: Replete magnesium followed by potassium.  Tobacco use: Reports cutting back to 1.5 PPD per week.  Patient strongly advised to quit smoking completely given likely bronchogenic cancer.  Nicotine patch and lozenges ordered.  DVT prophylaxis: Lovenox Code Status: Full code, confirmed with patient Family Communication: Discussed with patient, he has discussed with family Disposition Plan: From home, dispo pending further evaluation/management and PT/OT eval Consults called: None Level of care: MedSurg Admission status:   Status is: Observation  The patient remains OBS appropriate and will d/c before 2 midnights.  Dispo: The patient is from: Home              Anticipated d/c is to: Home versus SNF              Anticipated d/c date is: 1 day              Patient currently is not medically stable to d/c.   Zada Finders MD [facility]  If 7PM-7AM, please contact night-coverage [email or web address]  07/10/2020, 1:19 AM

## 2020-07-09 NOTE — ED Provider Notes (Signed)
[facility] Emergency Department Provider Note ____________________________________________   Event Date/Time   First MD Initiated Contact with Patient 07/09/20 Kevin Baker     (approximate)  I have reviewed the triage vital signs and the nursing notes.   HISTORY  Chief Complaint Weakness and Leg Pain    HPI Kevin Baker is a 65 y.o. male with PMH as noted below including degenerative joint disease who presents with persistent left hip pain over the last several months and not preceded by any specific trauma.  The patient reports severe pain with even minimal movement of the hip.  He states that he is unable to walk normally but has to very slowly move and crawl around.  He has had multiple falls.  He has been seen by orthopedics and was referred to neurology but has not seen them yet.  However, he does not have anything at home for the pain and states that it is uncontrolled.  He also reports multiple other symptoms including generalized weakness, chronic diarrhea for 2 months, and intermittent back and arm pain.  Past Medical History:  Diagnosis Date  . Alcohol use disorder, severe, dependence ([place])   . Mass of lower lobe of left lung     Patient Active Problem List   Diagnosis Date Noted  . Lung mass on CT 05/14/2020  . Left inguinal hernia 05/14/2020  . Pedal edema 05/13/2020  . Degenerative joint disease (DJD) of hip 05/13/2020  . Ambulatory dysfunction 05/13/2020  . Possible new onset of congestive heart failure ([place]) 05/13/2020  . Acute on chronic pain of left hip 05/13/2020  . Daily consumption of alcohol 05/13/2020    Past Surgical History:  Procedure Laterality Date  . BACK SURGERY    . LEG SURGERY Right   . PELVIC FRACTURE SURGERY      Prior to Admission medications   Medication Sig Start Date End Date Taking? Authorizing Provider  acetaminophen (TYLENOL) 500 MG tablet Take 1,000 mg by mouth 3 (three) times daily. Patient not taking:  Reported on 05/14/2020 10/26/19   [provider]  baclofen (LIORESAL) 10 MG tablet Take 10 mg by mouth 3 (three) times daily as needed for muscle spasms. 11/21/18   [provider]  buPROPion (WELLBUTRIN SR) 150 MG 12 hr tablet Take 150 mg by mouth daily. 11/21/18   [provider]  Cholecalciferol 50 MCG (2000 UT) TABS Take 2,000 Units by mouth daily. 10/26/19   [provider]  diclofenac Sodium (VOLTAREN) 1 % GEL Apply 2-4 g topically 4 (four) times daily as needed. 04/17/20   [provider]  folic acid (FOLVITE) 1 MG tablet Take 1 mg by mouth daily. 11/29/18   [provider]  gabapentin (NEURONTIN) 300 MG capsule Take 300 mg by mouth 3 (three) times daily. 06/19/20   [provider]  hydrOXYzine (ATARAX/VISTARIL) 25 MG tablet Take 25 mg by mouth 3 (three) times daily as needed for anxiety. 12/15/18   [provider]  hydrOXYzine (VISTARIL) 25 MG capsule Take 25 mg by mouth 4 (four) times daily as needed for anxiety. 08/09/19   [provider]  lidocaine (LIDODERM) 5 % Place 1 patch onto the skin See admin instructions. Apply 1 patch to the skin once a day to painful areas for 12 hours, then remove. 12/07/18   [provider]  naloxone (NARCAN) nasal spray 4 mg/0.1 mL SPRAY 1 SPRAY INTO ONE NOSTRIL AS DIRECTED FOR OPIOID OVERDOSE (TURN PERSON ON SIDE AFTER DOSE. IF  NO RESPONSE IN 2-3 MINUTES OR PERSON RESPONDS BUT RELAPSES, REPEAT USING A NEW SPRAY DEVICE AND SPRAY INTO THE OTHER NOSTRIL. CALL 911 AFTER USE.) * EMERGENCY USE ONLY * 11/01/19   [provider]  naltrexone (DEPADE) 50 MG tablet Take 1 tablet by mouth daily. 12/15/18   [provider]  nicotine (NICODERM CQ - DOSED IN MG/24 HOURS) 21 mg/24hr patch APPLY 1 PATCH TO SKIN ONCE A DAY *APPLY TO NON-HAIRY, CLEAN, DRY AREA (NO TOBACCO PRODUCTS)* Patient not taking: No sig reported 10/26/19   [provider]  nicotine polacrilex (COMMIT) 4 MG  lozenge DISSOLVE 1 LOZENGE MOUTH EVERY HOUR AS NEEDED TO HELP STOP SMOKING ** CALL [facility] [phone] FOR INFORMATION ABOUT SUPPORT IN QUITTING.  - MAX 24 PIECES IN 24 HOURS TO HELP STOP SMOKING ** CALL [facility] [phone] FOR INFORMATION ABOUT SUPPORT IN QUITTING.  - MAX 24 PIECES IN 24 HOURS 11/21/18   [provider]  omeprazole (PRILOSEC) 20 MG capsule Take 20 mg by mouth daily. 10/26/19   [provider]  traZODone (DESYREL) 50 MG tablet Take 50 mg by mouth at bedtime as needed. 12/15/18   [provider]    Allergies Codeine and Sulfamethoxazole-trimethoprim  Family History  Problem Relation Age of Onset  . Heart disease Father     Social History Social History   Tobacco Use  . Smoking status: Current Every Day Smoker    Packs/day: 3.00    Types: Cigarettes  . Smokeless tobacco: Never Used  Substance Use Topics  . Alcohol use: Not Currently    Comment: for last 3 weeks, drinking daily   . Drug use: Not Currently    Review of Systems  Constitutional: No fever.  Positive for generalized weakness. Eyes: No visual changes. ENT: No sore throat. Cardiovascular: Denies chest pain. Respiratory: Denies shortness of breath. Gastrointestinal: Positive for diarrhea. Genitourinary: Negative for dysuria.  Musculoskeletal: Positive for left hip pain. Skin: Negative for rash. Neurological: Negative for focal weakness or numbness.   ____________________________________________   PHYSICAL EXAM:  VITAL SIGNS: ED Triage Vitals  Enc Vitals Group     BP 07/09/20 1430 93/64     Pulse Rate 07/09/20 1430 77     Resp 07/09/20 1430 17     Temp 07/09/20 1430 98.3 F (36.8 C)     Temp Source 07/09/20 1430 Oral     SpO2 07/09/20 1430 99 %     Weight 07/09/20 1433 150 lb (68 kg)     Height 07/09/20 1433 6' (1.829 m)     Head Circumference --      Peak Flow --      Pain Score 07/09/20 1433 10     Pain Loc --      Pain Edu? --      Excl.  in [place]? --     Constitutional: Alert and oriented.  Frail appearing but in no acute distress. Eyes: Conjunctivae are normal.  Head: Atraumatic. Nose: No congestion/rhinnorhea. Mouth/Throat: Mucous membranes are moist.   Neck: Normal range of motion.  Cardiovascular: Normal rate, regular rhythm.   Good peripheral circulation. Respiratory: Normal respiratory effort.  No retractions.  Gastrointestinal: Soft and nontender. No distention.  Genitourinary: No flank tenderness. Musculoskeletal: No lower extremity edema.  Extremities warm and well perfused.  Left hip with pain on any range of motion to the hip or knee.  No external deformity.  No swelling.  2+ DP pulse. Neurologic:  Normal speech and language.  Motor intact in all extremities. Skin:  Skin is warm and dry. No rash noted. Psychiatric: Mood and affect are normal. Speech and behavior are normal.  ____________________________________________   LABS (all labs ordered are listed, but only abnormal results are displayed)  Labs Reviewed  BASIC METABOLIC PANEL - Abnormal; Notable for the following components:      Result Value   Sodium 133 (*)    Potassium 3.0 (*)    Glucose, Bld 103 (*)    BUN 26 (*)    All other components within normal limits  CBC - Abnormal; Notable for the following components:   RBC 2.96 (*)    Hemoglobin 9.9 (*)    HCT 28.7 (*)    Platelets 509 (*)    All other components within normal limits  HEPATIC FUNCTION PANEL - Abnormal; Notable for the following components:   Albumin 3.4 (*)    All other components within normal limits  RESP PANEL BY RT-PCR (FLU A&B, COVID) ARPGX2  LIPASE, BLOOD  URINALYSIS, COMPLETE (UACMP) WITH MICROSCOPIC  TROPONIN I (HIGH SENSITIVITY)  TROPONIN I (HIGH SENSITIVITY)   ____________________________________________  EKG   ____________________________________________  RADIOLOGY  XR L hip/pelvis: No acute  fracture  ____________________________________________   PROCEDURES  Procedure(s) performed: No  Procedures  Critical Care performed: No ____________________________________________   INITIAL IMPRESSION / ASSESSMENT AND PLAN / ED COURSE  Pertinent labs & imaging results that were available during my care of the patient were reviewed by me and considered in my medical decision making (see chart for details).  66 year old male with PMH as noted above presents with chronic left hip pain which is persistent and worsening.  He reports pain on minimal movement and cannot ambulate normally or complete his ADLs.  He has had multiple falls.  He also complains of chronic diarrhea for 2 months, with no recent change.  I reviewed the past medical records in [place].  The patient was most recently seen in the ED last December with leg swelling worse on the left.  He was admitted for possible new onset CHF.  There is also concern for possible bronchogenic carcinoma.  However, the patient left AMA prior to further work-up.  He was last seen by Dr. Rudene Christians from orthopedics on 2/2 and at that time the left hip pain was thought to be neuropathic in nature.  He was prescribed Neurontin and referred to neurology but has not yet followed up.  Differential includes exacerbation of his chronic degenerative joint disease, arthritis, neuropathic pain, or possible occult fracture/trauma.  There is no swelling to the left leg or evidence of DVT.  We will obtain new x-rays, lab work-up, and reassess.  ----------------------------------------- 11:22 PM on 07/09/2020 -----------------------------------------  The x-ray shows no acute findings compared to prior.  I have ordered a CT to evaluate for any possible occult trauma.  The lab work-up shows slightly worsened anemia from prior and mild hypokalemia but is otherwise unremarkable.  The patient has remained with slightly low blood pressures in  the ED despite fluids and I suspect he is somewhat hypovolemic.  There is no evidence of acute infection or sepsis.  Given that the hip pain is still somewhat undifferentiated and may need further work-up including neuro consult, and since the patient cannot ambulate or function at home, I consulted Dr. Posey Pronto from the hospitalist for admission.   ____________________________________________   FINAL CLINICAL IMPRESSION(S) / ED DIAGNOSES  Final diagnoses:  Left hip pain  NEW MEDICATIONS STARTED DURING THIS VISIT:  New Prescriptions   No medications on file     Note:  This document was prepared using Dragon voice recognition software and may include unintentional dictation errors.    Arta Silence, MD 07/09/20 [phone]

## 2020-07-09 NOTE — ED Triage Notes (Signed)
Pt c/o left leg, arm weakness and pain since last march 2021 , states he has been seeing ortho and has a referral to neurosurgeon but states he is having pain and "they keep giving me the run around.

## 2020-07-09 NOTE — ED Notes (Signed)
Pt hypotensive -- 92/55 current bp -- Dr Cherylann Banas notified and has subsequently ordered 1L NS bolus; Percocet on hold at this time for bp mgmt.

## 2020-07-10 ENCOUNTER — Telehealth: Payer: Self-pay | Admitting: Internal Medicine

## 2020-07-10 ENCOUNTER — Observation Stay: Payer: Medicare HMO

## 2020-07-10 DIAGNOSIS — Z515 Encounter for palliative care: Secondary | ICD-10-CM | POA: Diagnosis not present

## 2020-07-10 DIAGNOSIS — C3431 Malignant neoplasm of lower lobe, right bronchus or lung: Secondary | ICD-10-CM | POA: Diagnosis not present

## 2020-07-10 DIAGNOSIS — E43 Unspecified severe protein-calorie malnutrition: Secondary | ICD-10-CM | POA: Insufficient documentation

## 2020-07-10 DIAGNOSIS — M79605 Pain in left leg: Secondary | ICD-10-CM | POA: Diagnosis not present

## 2020-07-10 DIAGNOSIS — M898X5 Other specified disorders of bone, thigh: Secondary | ICD-10-CM

## 2020-07-10 DIAGNOSIS — M25552 Pain in left hip: Secondary | ICD-10-CM | POA: Insufficient documentation

## 2020-07-10 DIAGNOSIS — E876 Hypokalemia: Secondary | ICD-10-CM | POA: Diagnosis present

## 2020-07-10 DIAGNOSIS — R911 Solitary pulmonary nodule: Secondary | ICD-10-CM

## 2020-07-10 DIAGNOSIS — D649 Anemia, unspecified: Secondary | ICD-10-CM | POA: Diagnosis present

## 2020-07-10 LAB — IRON AND TIBC
Iron: 42 ug/dL — ABNORMAL LOW (ref 45–182)
Saturation Ratios: 20 % (ref 17.9–39.5)
TIBC: 216 ug/dL — ABNORMAL LOW (ref 250–450)
UIBC: 174 ug/dL

## 2020-07-10 LAB — RESP PANEL BY RT-PCR (FLU A&B, COVID) ARPGX2
Influenza A by PCR: NEGATIVE
Influenza B by PCR: NEGATIVE
SARS Coronavirus 2 by RT PCR: NEGATIVE

## 2020-07-10 LAB — URINE DRUG SCREEN, QUALITATIVE (ARMC ONLY)
Amphetamines, Ur Screen: NOT DETECTED
Barbiturates, Ur Screen: NOT DETECTED
Benzodiazepine, Ur Scrn: NOT DETECTED
Cannabinoid 50 Ng, Ur ~~LOC~~: NOT DETECTED
Cocaine Metabolite,Ur ~~LOC~~: NOT DETECTED
MDMA (Ecstasy)Ur Screen: NOT DETECTED
Methadone Scn, Ur: NOT DETECTED
Opiate, Ur Screen: NOT DETECTED
Phencyclidine (PCP) Ur S: NOT DETECTED
Tricyclic, Ur Screen: POSITIVE — AB

## 2020-07-10 LAB — CBC
HCT: 29.1 % — ABNORMAL LOW (ref 39.0–52.0)
Hemoglobin: 10.4 g/dL — ABNORMAL LOW (ref 13.0–17.0)
MCH: 33.5 pg (ref 26.0–34.0)
MCHC: 35.7 g/dL (ref 30.0–36.0)
MCV: 93.9 fL (ref 80.0–100.0)
Platelets: 501 10*3/uL — ABNORMAL HIGH (ref 150–400)
RBC: 3.1 MIL/uL — ABNORMAL LOW (ref 4.22–5.81)
RDW: 12.8 % (ref 11.5–15.5)
WBC: 8.4 10*3/uL (ref 4.0–10.5)
nRBC: 0 % (ref 0.0–0.2)

## 2020-07-10 LAB — MAGNESIUM
Magnesium: 1.1 mg/dL — ABNORMAL LOW (ref 1.7–2.4)
Magnesium: 1.8 mg/dL (ref 1.7–2.4)

## 2020-07-10 LAB — PHOSPHORUS: Phosphorus: 2 mg/dL — ABNORMAL LOW (ref 2.5–4.6)

## 2020-07-10 LAB — BASIC METABOLIC PANEL
Anion gap: 9 (ref 5–15)
BUN: 19 mg/dL (ref 8–23)
CO2: 24 mmol/L (ref 22–32)
Calcium: 9.1 mg/dL (ref 8.9–10.3)
Chloride: 100 mmol/L (ref 98–111)
Creatinine, Ser: 0.88 mg/dL (ref 0.61–1.24)
GFR, Estimated: >60 mL/min (ref >60)
Glucose, Bld: 90 mg/dL (ref 70–99)
Potassium: 3.1 mmol/L — ABNORMAL LOW (ref 3.5–5.1)
Sodium: 133 mmol/L — ABNORMAL LOW (ref 135–145)

## 2020-07-10 LAB — FERRITIN: Ferritin: 384 ng/mL — ABNORMAL HIGH (ref 24–336)

## 2020-07-10 LAB — C-REACTIVE PROTEIN: CRP: 4.2 mg/dL — ABNORMAL HIGH (ref <1.0)

## 2020-07-10 LAB — ETHANOL: Alcohol, Ethyl (B): <10 mg/dL (ref <10)

## 2020-07-10 LAB — TROPONIN I (HIGH SENSITIVITY): Troponin I (High Sensitivity): 6 ng/L (ref <18)

## 2020-07-10 LAB — SEDIMENTATION RATE: Sed Rate: 68 mm/hr — ABNORMAL HIGH (ref 0–20)

## 2020-07-10 LAB — FOLATE: Folate: 18.4 ng/mL (ref >5.9)

## 2020-07-10 LAB — VITAMIN B12: Vitamin B-12: 327 pg/mL (ref 180–914)

## 2020-07-10 MED ORDER — ENOXAPARIN SODIUM 40 MG/0.4ML ~~LOC~~ SOLN
40.0000 mg | SUBCUTANEOUS | Status: DC
  Administered 2020-07-10: 40 mg via SUBCUTANEOUS
  Filled 2020-07-10: qty 0.4

## 2020-07-10 MED ORDER — DEXAMETHASONE 0.5 MG PO TABS
2.0000 mg | ORAL_TABLET | Freq: Two times a day (BID) | ORAL | Status: DC
  Administered 2020-07-10 – 2020-07-19 (×18): 2 mg via ORAL
  Filled 2020-07-10 (×19): qty 4

## 2020-07-10 MED ORDER — NICOTINE POLACRILEX 2 MG MT LOZG
2.0000 mg | LOZENGE | OROMUCOSAL | Status: DC | PRN
  Filled 2020-07-10: qty 1

## 2020-07-10 MED ORDER — SODIUM CHLORIDE 0.9 % IV BOLUS
500.0000 mL | Freq: Once | INTRAVENOUS | Status: AC
  Administered 2020-07-10: 500 mL via INTRAVENOUS

## 2020-07-10 MED ORDER — ACETAMINOPHEN 650 MG RE SUPP: 650.0000 mg | Freq: Four times a day (QID) | RECTAL | Status: DC | PRN

## 2020-07-10 MED ORDER — ACETAMINOPHEN 500 MG PO TABS
1000.0000 mg | ORAL_TABLET | Freq: Three times a day (TID) | ORAL | Status: DC
  Administered 2020-07-10 – 2020-07-19 (×29): 1000 mg via ORAL
  Filled 2020-07-10 (×29): qty 2

## 2020-07-10 MED ORDER — POTASSIUM CHLORIDE 20 MEQ PO PACK
80.0000 meq | PACK | Freq: Once | ORAL | Status: AC
  Administered 2020-07-10: 80 meq via ORAL
  Filled 2020-07-10: qty 4

## 2020-07-10 MED ORDER — MAGNESIUM SULFATE 2 GM/50ML IV SOLN
2.0000 g | Freq: Once | INTRAVENOUS | Status: AC
  Administered 2020-07-10: 2 g via INTRAVENOUS
  Filled 2020-07-10: qty 50

## 2020-07-10 MED ORDER — BUPROPION HCL ER (SR) 150 MG PO TB12
150.0000 mg | ORAL_TABLET | Freq: Every day | ORAL | Status: DC
  Administered 2020-07-10 – 2020-07-19 (×10): 150 mg via ORAL
  Filled 2020-07-10 (×10): qty 1

## 2020-07-10 MED ORDER — PANTOPRAZOLE SODIUM 40 MG PO TBEC
40.0000 mg | DELAYED_RELEASE_TABLET | Freq: Every day | ORAL | Status: DC
  Administered 2020-07-10 – 2020-07-12 (×3): 40 mg via ORAL
  Filled 2020-07-10 (×2): qty 1

## 2020-07-10 MED ORDER — GABAPENTIN 300 MG PO CAPS
300.0000 mg | ORAL_CAPSULE | Freq: Three times a day (TID) | ORAL | Status: DC
  Administered 2020-07-10: 300 mg via ORAL
  Filled 2020-07-10: qty 1

## 2020-07-10 MED ORDER — MORPHINE SULFATE (PF) 2 MG/ML IV SOLN
2.0000 mg | INTRAVENOUS | Status: DC | PRN
  Administered 2020-07-11: 2 mg via INTRAVENOUS
  Filled 2020-07-10: qty 1

## 2020-07-10 MED ORDER — ACETAMINOPHEN 500 MG PO TABS: 1000.0000 mg | ORAL_TABLET | Freq: Four times a day (QID) | ORAL | Status: DC | PRN

## 2020-07-10 MED ORDER — BACLOFEN 10 MG PO TABS
10.0000 mg | ORAL_TABLET | Freq: Three times a day (TID) | ORAL | Status: DC | PRN
  Filled 2020-07-10: qty 1

## 2020-07-10 MED ORDER — HYDROXYZINE PAMOATE 25 MG PO CAPS
25.0000 mg | ORAL_CAPSULE | Freq: Four times a day (QID) | ORAL | Status: DC | PRN
  Filled 2020-07-10 (×2): qty 1

## 2020-07-10 MED ORDER — GABAPENTIN 400 MG PO CAPS
400.0000 mg | ORAL_CAPSULE | Freq: Three times a day (TID) | ORAL | Status: DC
  Administered 2020-07-10 – 2020-07-19 (×28): 400 mg via ORAL
  Filled 2020-07-10 (×28): qty 1

## 2020-07-10 MED ORDER — ONDANSETRON HCL 4 MG PO TABS
4.0000 mg | ORAL_TABLET | Freq: Four times a day (QID) | ORAL | Status: DC | PRN
  Administered 2020-07-10: 4 mg via ORAL
  Filled 2020-07-10: qty 1

## 2020-07-10 MED ORDER — DICLOFENAC SODIUM 1 % EX GEL
2.0000 g | Freq: Four times a day (QID) | CUTANEOUS | Status: DC
  Administered 2020-07-10: 08:00:00 2 g via TOPICAL
  Administered 2020-07-10 – 2020-07-12 (×9): 4 g via TOPICAL
  Administered 2020-07-12: 22:00:00 2 g via TOPICAL
  Administered 2020-07-12 – 2020-07-13 (×2): 4 g via TOPICAL
  Administered 2020-07-13: 14:00:00 2 g via TOPICAL
  Administered 2020-07-14 – 2020-07-15 (×5): 4 g via TOPICAL
  Administered 2020-07-16 (×4): 2 g via TOPICAL
  Administered 2020-07-17 (×3): 4 g via TOPICAL
  Administered 2020-07-18: 2 g via TOPICAL
  Administered 2020-07-18 (×2): 4 g via TOPICAL
  Administered 2020-07-18 – 2020-07-19 (×2): 2 g via TOPICAL
  Administered 2020-07-19: 10:00:00 4 g via TOPICAL
  Filled 2020-07-10 (×2): qty 100

## 2020-07-10 MED ORDER — NICOTINE 14 MG/24HR TD PT24
14.0000 mg | MEDICATED_PATCH | Freq: Every day | TRANSDERMAL | Status: DC
  Administered 2020-07-10 – 2020-07-19 (×6): 14 mg via TRANSDERMAL
  Filled 2020-07-10 (×7): qty 1

## 2020-07-10 MED ORDER — SENNOSIDES-DOCUSATE SODIUM 8.6-50 MG PO TABS: 1.0000 | ORAL_TABLET | Freq: Every evening | ORAL | Status: DC | PRN

## 2020-07-10 MED ORDER — ONDANSETRON HCL 4 MG/2ML IJ SOLN: 4.0000 mg | Freq: Four times a day (QID) | INTRAMUSCULAR | Status: DC | PRN

## 2020-07-10 MED ORDER — OXYCODONE HCL 5 MG PO TABS
5.0000 mg | ORAL_TABLET | ORAL | Status: DC | PRN
  Administered 2020-07-10 – 2020-07-13 (×6): 5 mg via ORAL
  Filled 2020-07-10 (×6): qty 1

## 2020-07-10 MED ORDER — ENSURE ENLIVE PO LIQD
237.0000 mL | Freq: Three times a day (TID) | ORAL | Status: DC
  Administered 2020-07-10 – 2020-07-19 (×27): 237 mL via ORAL

## 2020-07-10 MED ORDER — ADULT MULTIVITAMIN W/MINERALS CH
1.0000 | ORAL_TABLET | Freq: Every day | ORAL | Status: DC
  Administered 2020-07-11 – 2020-07-19 (×9): 1 via ORAL
  Filled 2020-07-10 (×9): qty 1

## 2020-07-10 MED ORDER — OXYCODONE-ACETAMINOPHEN 5-325 MG PO TABS
1.0000 | ORAL_TABLET | ORAL | Status: DC | PRN
  Administered 2020-07-10: 1 via ORAL
  Filled 2020-07-10: qty 1

## 2020-07-10 NOTE — Progress Notes (Signed)
PROGRESS NOTE    Kevin Baker  YOV:[id number] DOB: 08/31/1955 DOA: 07/09/2020 PCP: Patient, No Pcp Per   Brief Narrative:  Patient with history of alcohol use disorder, tobacco use, hepatitis C, chronic gastritis, chronic pain who presents for evaluation of worsening left hip pain and difficulty ambulating.  He was previously seen in the hospital in December 2021 for similar symptoms.  At that time the primary bronchogenic carcinoma was made aware to him and he was recommended to stay for further evaluation and management however during that admission he elected to leave [place].  He was seen by outpatient orthopedics and was given a prescription for Neurontin for left hip pain and a referral to neurology.  Patient is not followed up.  Presented to ED with progressive worsening hip pain.  I have communicated with oncology who agreed to consult on the patient.  Recommendations appreciated.  Per oncology requesting orthopedic evaluation for surgical fixation of a osseous lesion in the left femur.  Palliative care also involved.   Assessment & Plan:   Principal Problem:   Acute on chronic pain of left hip Active Problems:   Mass of lower lobe of left lung   Hypokalemia   Hypomagnesemia   Normocytic anemia   Palliative care encounter  Acute on chronic left hip pain with generalized weakness and frequent falls Degenerative joint disease of the left hip: CT imaging of the left hip concerning for metastatic lesion at the left lesser trochanter.  Also shows remote posttraumatic changes of the left pubic root, superior and inferior rami, left ilium and ankylosis at the left SI joint. -Oncology consult requested -Orthopedic consult requested Plan: Multimodal pain control considering severity of pain Patient will likely need intravenous narcotics for improved control Therapy evaluations as able Reevaluate and titrate pain regimen Follow-up orthopedics, oncology, palliative care  recommendations  Left lower lobe lung mass: 4.6 x 3.8 spiculated mass seen at the left lower lobe on CT imaging 05/14/2020 concerning for primary bronchogenic carcinoma.  Has not had outpatient follow-up for this yet.  Discussed findings with patient. -We will reach out to pulmonary for consideration for bronchoscopy and transbronchial biopsy  Alcohol use disorder: Reports he quit drinking about 2 months ago.  Not currently exhibiting any signs of withdrawal.  Continue CIWA checks without Ativan for now.  Normocytic anemia: Without obvious bleeding.   No indication for transfusion Iron indices indicate anemia of chronic disease  Hypokalemia/hypomagnesemia: Monitor replace as necessary  Tobacco use: Reports cutting back to 1.5 PPD per week.  Patient strongly advised to quit smoking completely given likely bronchogenic cancer.  Nicotine patch and lozenges ordered.   DVT prophylaxis: SQ Lovenox Code Status: Full Family Communication: None today Disposition Plan: Status is: Observation  The patient will require care spanning > 2 midnights and should be moved to inpatient because: Ongoing active pain requiring inpatient pain management and Inpatient level of care appropriate due to severity of illness  Dispo: The patient is from: Home              Anticipated d/c is to: SNF              Anticipated d/c date is: 3 days              Patient currently is not medically stable to d/c.   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  Consultations requested with oncology, pulmonology, orthopedic surgery, palliative care.  Disposition plan pending.  Anticipate patient may benefit from skilled nursing facility at time of discharge    Level of care: Med-Surg  Consultants:   Oncology  Orthopedics  Palliative care  Pulmonology  Procedures:   None  Antimicrobials:   None   Subjective: Patient seen and examined.  In substantial  amount of pain.  Pain localized at left lower extremity.  No other complaints.  Objective: Vitals:   07/10/20 1105 07/10/20 1516 07/10/20 1522 07/10/20 1535  BP: 96/63 (!) 83/53 (!) 86/50   Pulse: 84 68    Resp: 19 18    Temp: 98 F (36.7 C) 98 F (36.7 C)    TempSrc:  Oral    SpO2: 98% 100%    Weight:    58.9 kg  Height:        Intake/Output Summary (Last 24 hours) at 07/10/2020 1548 Last data filed at 07/10/2020 1409 Gross per 24 hour  Intake 960.14 ml  Output --  Net 960.14 ml   Filed Weights   07/09/20 1433 07/10/20 1535  Weight: 68 kg 58.9 kg    Examination:  General exam: Appears calm and comfortable  Respiratory system: Decreased air entry.  Scattered coarse crackles bilaterally.  Normal work of breathing.  Room air Cardiovascular system: S1 & S2 heard, RRR. No JVD, murmurs, rubs, gallops or clicks. No pedal edema. Gastrointestinal system: Abdomen is nondistended, soft and nontender. No organomegaly or masses felt. Normal bowel sounds heard. Central nervous system: Alert and oriented. No focal neurological deficits. Extremities: Markedly decreased power left lower extremity Skin: No rashes, lesions or ulcers Psychiatry: Judgement and insight appear normal. Mood & affect appropriate.     Data Reviewed: I have personally reviewed following labs and imaging studies  CBC: Recent Labs  Lab 07/09/20 1513 07/10/20 0608  WBC 8.6 8.4  HGB 9.9* 10.4*  HCT 28.7* 29.1*  MCV 97.0 93.9  PLT 509* 073*   Basic Metabolic Panel: Recent Labs  Lab 07/09/20 1513 07/09/20 2313 07/10/20 0608  NA 133*  --  133*  K 3.0*  --  3.1*  CL 98  --  100  CO2 25  --  24  GLUCOSE 103*  --  90  BUN 26*  --  19  CREATININE 1.03  --  0.88  CALCIUM 9.4  --  9.1  MG  --  1.1* 1.8  PHOS  --   --  2.0*   GFR: Estimated Creatinine Clearance: 69.7 mL/min (by C-G formula based on SCr of 0.88 mg/dL). Liver Function Tests: Recent Labs  Lab 07/09/20 2120  AST 19  ALT 16  ALKPHOS  66  BILITOT 0.5  PROT 7.2  ALBUMIN 3.4*   Recent Labs  Lab 07/09/20 2120  LIPASE 39   No results for input(s): AMMONIA in the last 168 hours. Coagulation Profile: No results for input(s): INR, PROTIME in the last 168 hours. Cardiac Enzymes: No results for input(s): CKTOTAL, CKMB, CKMBINDEX, TROPONINI in the last 168 hours. BNP (last 3 results) No results for input(s): PROBNP in the last 8760 hours. HbA1C: No results for input(s): HGBA1C in the last 72 hours. CBG: No results for input(s): GLUCAP in the last 168 hours. Lipid Profile: No results for input(s): CHOL, HDL, LDLCALC, TRIG, CHOLHDL, LDLDIRECT in the last 72 hours. Thyroid Function Tests: No results for input(s): TSH, T4TOTAL, FREET4, T3FREE, THYROIDAB in the last 72 hours. Anemia Panel: Recent Labs    07/10/20 0608  VITAMINB12 327  FOLATE 18.4  FERRITIN 384*  TIBC 216*  IRON 42*   Sepsis Labs: No results for input(s): PROCALCITON, LATICACIDVEN in the last 168 hours.  Recent Results (from the past 240 hour(s))  Resp Panel by RT-PCR (Flu A&B, Covid) Nasopharyngeal Swab     Status: None   Collection Time: 07/09/20 11:13 PM   Specimen: Nasopharyngeal Swab; Nasopharyngeal(NP) swabs in vial transport medium  Result Value Ref Range Status   SARS Coronavirus 2 by RT PCR NEGATIVE NEGATIVE Final    Comment: (NOTE) SARS-CoV-2 target nucleic acids are NOT DETECTED.  The SARS-CoV-2 RNA is generally detectable in upper respiratory specimens during the acute phase of infection. The lowest concentration of SARS-CoV-2 viral copies this assay can detect is 138 copies/mL. A negative result does not preclude SARS-Cov-2 infection and should not be used as the sole basis for treatment or other patient management decisions. A negative result may occur with  improper specimen collection/handling, submission of specimen other than nasopharyngeal swab, presence of viral mutation(s) within the areas targeted by this assay, and  inadequate number of viral copies(<138 copies/mL). A negative result must be combined with clinical observations, patient history, and epidemiological information. The expected result is Negative.  Fact Sheet for Patients:  EntrepreneurPulse.com.au  Fact Sheet for Healthcare Providers:  IncredibleEmployment.be  This test is no t yet approved or cleared by the [place] FDA and  has been authorized for detection and/or diagnosis of SARS-CoV-2 by FDA under an Emergency Use Authorization (EUA). This EUA will remain  in effect (meaning this test can be used) for the duration of the COVID-19 declaration under Section 564(b)(1) of the Act, 21 U.S.C.section 360bbb-3(b)(1), unless the authorization is terminated  or revoked sooner.       Influenza A by PCR NEGATIVE NEGATIVE Final   Influenza B by PCR NEGATIVE NEGATIVE Final    Comment: (NOTE) The Xpert Xpress SARS-CoV-2/FLU/RSV plus assay is intended as an aid in the diagnosis of influenza from Nasopharyngeal swab specimens and should not be used as a sole basis for treatment. Nasal washings and aspirates are unacceptable for Xpert Xpress SARS-CoV-2/FLU/RSV testing.  Fact Sheet for Patients: EntrepreneurPulse.com.au  Fact Sheet for Healthcare Providers: IncredibleEmployment.be  This test is not yet approved or cleared by the [place] FDA and has been authorized for detection and/or diagnosis of SARS-CoV-2 by FDA under an Emergency Use Authorization (EUA). This EUA will remain in effect (meaning this test can be used) for the duration of the COVID-19 declaration under Section 564(b)(1) of the Act, 21 U.S.C. section 360bbb-3(b)(1), unless the authorization is terminated or revoked.  Performed at [facility], [address], [place], [place] [address]          Radiology Studies: CT Hip Left Wo Contrast  Result Date:  07/10/2020 CLINICAL DATA:  Left leg and arm weakness with pain since March 2021, negative radiographs EXAM: CT OF THE LEFT HIP WITHOUT CONTRAST TECHNIQUE: Multidetector CT imaging of the left hip was performed according to the standard protocol. Multiplanar CT image reconstructions were also generated. COMPARISON:  Radiograph 07/09/2020, CT 05/14/2020 FINDINGS: Bones/Joint/Cartilage The osseous structures appear diffusely demineralized which may limit detection of small or nondisplaced fractures. Ovoid lucent lesion centered within the lesser trochanter measuring approximately 2.6 x 2.3 x 3.1 cm in size. No clear associated pathologic fracture is evident at this time. No other suspicious lytic or blastic lesions within the included margins of imaging. No other acute bony abnormality. Specifically, no fracture, subluxation, or dislocation of the left hip or included bony pelvis. More remote remote  posttraumatic changes of the left pubic root, superior and inferior rami, unchanged from comparison CT. Ankylosis left SI joint and likely prior traumatic deformity of the left ilium. Degenerative change at the pubic symphysis and left hip is similar to comparison. No sizable hip effusion. Ligaments Suboptimally assessed by CT. Muscles and Tendons No acute musculotendinous abnormality is seen. Soft tissues Atherosclerotic calcifications in the iliac arteries and proximal common and superficial femoral arteries. Circumferential thickening of the urinary bladder though possibly related to underdistention. Fluid-filled appearance of the colon, correlate for rapid transit state. IMPRESSION: 1. Ovoid lucent lesion centered within the left lesser trochanter measuring approximately 2.6 x 2.3 x 3.1 cm in size. No clear associated pathologic fracture is evident at this time. Findings concerning for metastatic lesion given known left lung lesion. 2. Remote posttraumatic changes of the left pubic root, superior and inferior rami, and  left ilium unchanged from comparison CT. 3. Ankylosis left SI joint. 4. Circumferential thickening of the urinary bladder though possibly related to underdistention. Correlate with urinalysis to exclude cystitis. 5. Fluid-filled appearance of the colon, could reflect diarrheal illness/rapid transit state. 6. Atherosclerosis These results were called by telephone at the time of interpretation on 07/10/2020 at 12:23 am to provider Dr. Tamala Julian, who verbally acknowledged these results. Electronically Signed   By: Lovena Le M.D.   On: 07/10/2020 00:23   DG Hip Unilat W or Wo Pelvis 2-3 Views Left  Result Date: 07/09/2020 CLINICAL DATA:  Acute on chronic hip pain EXAM: DG HIP (WITH OR WITHOUT PELVIS) 2-3V LEFT COMPARISON:  05/13/2020 FINDINGS: Chronic deformity of left iliac bone and pubic rami. No acute displaced fracture or malalignment is seen. There are mild degenerative changes of both hips. Vascular calcifications. IMPRESSION: No acute osseous abnormality. Chronic deformity of left iliac bone and pubic rami. Electronically Signed   By: Donavan Foil M.D.   On: 07/09/2020 21:25        Scheduled Meds: . acetaminophen  1,000 mg Oral TID  . buPROPion  150 mg Oral Daily  . dexamethasone  2 mg Oral Q12H  . diclofenac Sodium  2-4 g Topical QID  . enoxaparin (LOVENOX) injection  40 mg Subcutaneous Q24H  . feeding supplement  237 mL Oral TID BM  . gabapentin  400 mg Oral TID  . [START ON 07/11/2020] multivitamin with minerals  1 tablet Oral Daily  . nicotine  14 mg Transdermal Daily   Continuous Infusions: . sodium chloride       LOS: 0 days    Time spent: 25 minutes    Sidney Ace, MD [facility] Pager [phone]  If 7PM-7AM, please contact night-coverage 07/10/2020, 3:48 PM

## 2020-07-10 NOTE — Progress Notes (Signed)
   07/10/20 1550  Clinical Encounter Type  Visited With Patient  Visit Type Initial  Referral From Physician  Consult/Referral To Chaplain   As the on-call chaplain, I responded to the consult for Kevin Baker regarding an AD. He expressed he was considering naming his brother as his HPA. I explained the AD documents and left them with him to allow him some time to process the information.   [place], [place]

## 2020-07-10 NOTE — Evaluation (Signed)
Occupational Therapy Evaluation Patient Details Name: Kevin Baker MRN: [id number] DOB: Sep 16, 1955 Today's Date: 07/10/2020    History of Present Illness 65 y.o. 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   Clinical Impression   Kevin Baker presents today with generalized weakness, reduced endurance, and reports 10/10 LLE pain with movement (2/10 pain at rest). He has limited ROM in both LUE and LLE. He states that the past few months he has been living with his brother have been difficult, as Kevin Baker has had repeated bouts of bowel incontinence and that he has been unable to clean up either himself or his surroundings. He also reports at least 15 falls in the previous 2 months. He has now begun walking with a SPC, but this has not helped him to remain upright. He requires Max A for sit<>stand transfers. On a positive note, pt states that he has been alcohol-free for over 2 months, and has reduced his cigarette consumption by > half. He states that he notices his urge to drink alcohol is lessening, and he recognizes that he feels better w/o alcohol. He has been able to achieve reduction in smoking through use of lozenges. Pt is aware that he needs to receive help with bowel control, falls reduction, and ambulation before he can return to his brother's home, and reports that he is willing to go for STR, so long as this "will not cause me to lose my social security check."   Follow Up Recommendations  SNF    Equipment Recommendations       Recommendations for Other Services       Precautions / Restrictions Precautions Precautions: Fall Restrictions Weight  Bearing Restrictions: No Other Position/Activity Restrictions: Pt extremely hesitant, essentially unable to put weight on the L LE      Mobility Bed Mobility Overal bed mobility: Needs Assistance Bed Mobility: Supine to Sit     Supine to sit: Min assist     General bed mobility comments: pt received/left in recliner    Transfers Overall transfer level: Needs assistance Equipment used: Rolling walker (2 wheeled) Transfers: Sit to/from [employer] Sit to Stand: Max assist   Squat pivot transfers: Mod assist;Max assist     General transfer comment: Recommend 2+ assist for transfers/ambulation    Balance Overall balance assessment: Needs assistance Sitting-balance support: Bilateral upper extremity supported Sitting balance-Leahy Scale: Fair Sitting balance - Comments: Pt unable to get L hip to 90 flexion sitting position, very guarded and calling out in pain but able to maintain sitting at EOB w/o direct assist     Standing balance-Leahy Scale: Zero Standing balance comment: unable to support himself in standing, knees buckling or (L) unable to accept weight.  Inappropriate use of ADs               High Level Balance Comments: Pt reports having at least 15 falls in the previous 2 months           ADL either performed or assessed with clinical judgement   ADL Overall ADL's : Needs assistance/impaired Eating/Feeding: Set up;Modified independent   Grooming: Modified independent;Sitting  Vision Baseline Vision/History: Wears glasses Wears Glasses: Reading only;Distance only (has separate glasses for reading and distance) Patient Visual Report: No change from baseline Additional Comments: reports he is due to have a vision exam through [place], that it has been "several years" since most recent exam     Perception     Praxis      Pertinent Vitals/Pain Pain Assessment: 0-10 Pain Score: 8   Pain Location: L side of body (primarily LE)     Hand Dominance Right   Extremity/Trunk Assessment Upper Extremity Assessment Upper Extremity Assessment: Generalized weakness;LUE deficits/detail LUE Deficits / Details: ROM ~ 100 degrees   Lower Extremity Assessment Lower Extremity Assessment: Generalized weakness       Communication Communication Communication: HOH (using hearing aids in both ears. does not have them here in hospital)   Cognition Arousal/Alertness: Awake/alert Behavior During Therapy: Restless Overall Cognitive Status: Within Functional Limits for tasks assessed                                 General Comments: Reports being alcohol-free for previous 2 months   General Comments       Exercises Other Exercises Other Exercises: Educ re: EC, falls prevention, substance use harm reduction strategies, DC planning   Shoulder Instructions      Home Living Family/patient expects to be discharged to:: Unsure Living Arrangements: Other relatives                               Additional Comments: Pt has been living with his brother and sister-in-law      Prior Functioning/Environment Level of Independence: Needs assistance  Gait / Transfers Assistance Needed: reports attempting to ambulate with SPC cane in recent months, with frequent falls ADL's / Homemaking Assistance Needed: pt reports frequent bowel incontinence, requiring assistance from brother            OT Problem List: Decreased activity tolerance;Impaired balance (sitting and/or standing);Decreased safety awareness;Pain;Decreased range of motion;Decreased strength;Decreased knowledge of use of DME or AE;Decreased knowledge of precautions      OT Treatment/Interventions: Therapeutic exercise;Self-care/ADL training;Therapeutic activities;Energy conservation;Balance training;Patient/family education    OT Goals(Current goals can be found in the care plan section) Acute  Rehab OT Goals Patient Stated Goal: Get back to walking OT Goal Formulation: With patient Time For Goal Achievement: 07/24/20 Potential to Achieve Goals: Fair ADL Goals Pt Will Transfer to Toilet: with min assist;stand pivot transfer (using LRAD) Pt Will Perform Toileting - Clothing Manipulation and hygiene: with min assist;sit to/from stand;with adaptive equipment Additional ADL Goal #1: Pt will identify/demonstrate 3+ falls prevention techniques  OT Frequency: Min 1X/week   Barriers to D/C: Inaccessible home environment;Decreased caregiver support          Co-evaluation              AM-PAC OT "6 Clicks" Daily Activity     Outcome Measure Help from another person eating meals?: None Help from another person taking care of personal grooming?: A Little Help from another person toileting, which includes using toliet, bedpan, or urinal?: A Lot Help from another person bathing (including washing, rinsing, drying)?: A Lot Help from another person to put on and taking off regular upper body clothing?: A Little Help from another person to put on and taking off regular lower body clothing?: A Lot 6 Click Score: 16   End  of Session    Activity Tolerance: Patient limited by pain Patient left: in chair;with call bell/phone within reach;with chair alarm set;with nursing/sitter in room  OT Visit Diagnosis: Unsteadiness on feet (R26.81);Other abnormalities of gait and mobility (R26.89);Repeated falls (R29.6);Muscle weakness (generalized) (M62.81);Pain Pain - Right/Left: Left Pain - part of body: Hip;Knee;Leg                Time: 6962-9528 OT Time Calculation (min): 18 min Charges:  OT General Charges $OT Visit: 1 Visit OT Evaluation $OT Eval Moderate Complexity: 1 Mod OT Treatments $Self Care/Home Management : 8-22 mins  Josiah Lobo, PhD, MS, OTR/L ascom [phone] 07/10/20, 1:45 PM

## 2020-07-10 NOTE — Consult Note (Signed)
Pulmonary Medicine          Date: 07/10/2020,   MRN# [id number] Kevin Baker 1955-08-06     AdmissionWeight: 68 kg                 CurrentWeight: 58.9 kg      CHIEF COMPLAINT:   Lung mass of LLL 4.3cm    HISTORY OF PRESENT ILLNESS   Kevin Baker is a 65 y.o. 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  Remote posttraumatic changes of the left pubic root, superior and inferior rami and left ilium are unchanged from prior CT.  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  Fluid-filled appearance of the colon also seen.  Patient was given 1 L normal saline and the hospitalist service was consulted to admit for further evaluation and management.  Patient is not using any oxygen at this time.  He stopped smoking last 2 weeks he stopped drinking alcohol 2 monts ago. [name]  He is not coughing.  Pulmonary consultation for tissue diagnosis of LLL mass.  Reviweed with patient he isa greeable to  Biopsy.    PAST MEDICAL HISTORY   Past Medical History:  Diagnosis Date  . Alcohol use disorder, severe, dependence ([place])   . Mass of lower lobe of left lung      SURGICAL HISTORY   Past Surgical History:  Procedure Laterality Date  . BACK SURGERY    . LEG SURGERY Right   . PELVIC FRACTURE SURGERY       FAMILY HISTORY   Family History  Problem Relation Age of Onset  .  Heart disease Father      SOCIAL HISTORY   Social History   Tobacco Use  . Smoking status: Current Every Day Smoker    Packs/day: 3.00    Types: Cigarettes  . Smokeless tobacco: Never Used  Substance Use Topics  . Alcohol use: Not Currently    Comment: for last 3 weeks, drinking daily   . Drug use: Not Currently     MEDICATIONS    Home Medication:    Current Medication:  Current Facility-Administered Medications:  .  acetaminophen (TYLENOL) tablet 1,000 mg, 1,000 mg, Oral, TID, Sreenath, Sudheer B, MD, 1,000 mg at 07/10/20 1556 .  baclofen (LIORESAL) tablet 10 mg, 10 mg, Oral, TID PRN, Zada Finders R, MD .  buPROPion [facility] SR) 12 hr tablet 150 mg, 150 mg, Oral, Daily, Zada Finders R, MD, 150 mg at 07/10/20 0824 .  dexamethasone (DECADRON) tablet 2 mg, 2 mg, Oral, Q12H, Borders, Vonna Kotyk R, NP .  diclofenac Sodium (VOLTAREN) 1 % topical gel 2-4 g, 2-4 g, Topical, QID, Zada Finders R, MD, 4 g at 07/10/20 1414 .  feeding supplement (ENSURE ENLIVE / ENSURE PLUS) liquid 237 mL, 237 mL, Oral, TID BM, Sreenath, Sudheer B, MD .  gabapentin (NEURONTIN) capsule 400 mg, 400 mg, Oral, TID, Priscella Mann, Trula Slade, MD, 400  mg at 07/10/20 1556 .  hydrOXYzine (VISTARIL) capsule 25 mg, 25 mg, Oral, QID PRN, Zada Finders R, MD .  morphine 2 MG/ML injection 2 mg, 2 mg, Intravenous, Q3H PRN, Priscella Mann, Sudheer B, MD .  Derrill Memo ON 07/11/2020] multivitamin with minerals tablet 1 tablet, 1 tablet, Oral, Daily, Sreenath, Sudheer B, MD .  nicotine (NICODERM CQ - dosed in mg/24 hours) patch 14 mg, 14 mg, Transdermal, Daily, Zada Finders R, MD, 14 mg at 07/10/20 0335 .  nicotine polacrilex (COMMIT) lozenge 2 mg, 2 mg, Oral, PRN, Posey Pronto, Vishal R, MD .  ondansetron (ZOFRAN) tablet 4 mg, 4 mg, Oral, Q6H PRN **OR** ondansetron (ZOFRAN) injection 4 mg, 4 mg, Intravenous, Q6H PRN, Posey Pronto, Vishal R, MD .  oxyCODONE (Oxy IR/ROXICODONE) immediate release tablet 5 mg, 5 mg, Oral, Q4H PRN, Sreenath, Sudheer B,  MD .  senna-docusate (Senokot-S) tablet 1 tablet, 1 tablet, Oral, QHS PRN, Lenore Cordia, MD    ALLERGIES   Codeine, Sulfamethoxazole-trimethoprim, and Lactose intolerance (gi)     REVIEW OF SYSTEMS    Review of Systems:  Gen:  Denies  fever, sweats, chills weigh loss  HEENT: Denies blurred vision, double vision, ear pain, eye pain, hearing loss, nose bleeds, sore throat Cardiac:  No dizziness, chest pain or heaviness, chest tightness,edema Resp:   Denies cough or sputum porduction, shortness of breath,wheezing, hemoptysis,  Gi: Denies swallowing difficulty, stomach pain, nausea or vomiting, diarrhea, constipation, bowel incontinence Gu:  Denies bladder incontinence, burning urine Ext:   Denies Joint pain, stiffness or swelling Skin: Denies  skin rash, easy bruising or bleeding or hives Endoc:  Denies polyuria, polydipsia , polyphagia or weight change Psych:   Denies depression, insomnia or hallucinations   Other:  All other systems negative   VS: BP (!) 86/50 (BP Location: Right Arm)   Pulse 68   Temp 98 F (36.7 C) (Oral)   Resp 18   Ht 6' (1.829 m)   Wt 58.9 kg   SpO2 100%   BMI 17.61 kg/m      PHYSICAL EXAM    GENERAL:NAD, no fevers, chills, no weakness no fatigue HEAD: Normocephalic, atraumatic.  EYES: Pupils equal, round, reactive to light. Extraocular muscles intact. No scleral icterus.  MOUTH: Moist mucosal membrane. Dentition intact. No abscess noted.  EAR, NOSE, THROAT: Clear without exudates. No external lesions.  NECK: Supple. No thyromegaly. No nodules. No JVD.  PULMONARY: decreased air entry at LLL, with rhonchi bialterally  CARDIOVASCULAR: S1 and S2. Regular rate and rhythm. No murmurs, rubs, or gallops. No edema. Pedal pulses 2+ bilaterally.  GASTROINTESTINAL: Soft, nontender, nondistended. No masses. Positive bowel sounds. No hepatosplenomegaly.  MUSCULOSKELETAL: No swelling, clubbing, or edema. Range of motion full in all extremities.   NEUROLOGIC: Cranial nerves II through XII are intact. No gross focal neurological deficits. Sensation intact. Reflexes intact.  SKIN: No ulceration, lesions, rashes, or cyanosis. Skin warm and dry. Turgor intact.  PSYCHIATRIC: Mood, affect within normal limits. The patient is awake, alert and oriented x 3. Insight, judgment intact.       IMAGING    CT Hip Left Wo Contrast  Result Date: 07/10/2020 CLINICAL DATA:  Left leg and arm weakness with pain since March 2021, negative radiographs EXAM: CT OF THE LEFT HIP WITHOUT CONTRAST TECHNIQUE: Multidetector CT imaging of the left hip was performed according to the standard protocol. Multiplanar CT image reconstructions were also generated. COMPARISON:  Radiograph 07/09/2020, CT 05/14/2020 FINDINGS: Bones/Joint/Cartilage The osseous structures appear diffusely demineralized which may  limit detection of small or nondisplaced fractures. Ovoid lucent lesion centered within the lesser trochanter measuring approximately 2.6 x 2.3 x 3.1 cm in size. No clear associated pathologic fracture is evident at this time. No other suspicious lytic or blastic lesions within the included margins of imaging. No other acute bony abnormality. Specifically, no fracture, subluxation, or dislocation of the left hip or included bony pelvis. More remote remote posttraumatic changes of the left pubic root, superior and inferior rami, unchanged from comparison CT. Ankylosis left SI joint and likely prior traumatic deformity of the left ilium. Degenerative change at the pubic symphysis and left hip is similar to comparison. No sizable hip effusion. Ligaments Suboptimally assessed by CT. Muscles and Tendons No acute musculotendinous abnormality is seen. Soft tissues Atherosclerotic calcifications in the iliac arteries and proximal common and superficial femoral arteries. Circumferential thickening of the urinary bladder though possibly related to underdistention. Fluid-filled appearance  of the colon, correlate for rapid transit state. IMPRESSION: 1. Ovoid lucent lesion centered within the left lesser trochanter measuring approximately 2.6 x 2.3 x 3.1 cm in size. No clear associated pathologic fracture is evident at this time. Findings concerning for metastatic lesion given known left lung lesion. 2. Remote posttraumatic changes of the left pubic root, superior and inferior rami, and left ilium unchanged from comparison CT. 3. Ankylosis left SI joint. 4. Circumferential thickening of the urinary bladder though possibly related to underdistention. Correlate with urinalysis to exclude cystitis. 5. Fluid-filled appearance of the colon, could reflect diarrheal illness/rapid transit state. 6. Atherosclerosis These results were called by telephone at the time of interpretation on 07/10/2020 at 12:23 am to provider Dr. Tamala Julian, who verbally acknowledged these results. Electronically Signed   By: Lovena Le M.D.   On: 07/10/2020 00:23   DG Hip Unilat W or Wo Pelvis 2-3 Views Left  Result Date: 07/09/2020 CLINICAL DATA:  Acute on chronic hip pain EXAM: DG HIP (WITH OR WITHOUT PELVIS) 2-3V LEFT COMPARISON:  05/13/2020 FINDINGS: Chronic deformity of left iliac bone and pubic rami. No acute displaced fracture or malalignment is seen. There are mild degenerative changes of both hips. Vascular calcifications. IMPRESSION: No acute osseous abnormality. Chronic deformity of left iliac bone and pubic rami. Electronically Signed   By: Donavan Foil M.D.   On: 07/09/2020 21:25      ASSESSMENT/PLAN   4.6 x 3.8 cm spiculated mass is noted in the left lower lobe   - reviewed with patient he wishes to have biopsy and treatment of cancer   - we reviewed ENB/EBUS procedure   - patietn wishes to proceed with bronchosopic evaluation   - willhold lovenox and place on SCDs    - NPO post midnight Thursday   - will tenatively plan for Friday 07/12/20 --Reviewed risks/complications and benefits with patient, risks  include infection, pneumothorax/pneumomediastinum which may require chest tube placement as well as overnight/prolonged hospitalization and possible mechanical ventilation. Other risks include bleeding and very rarely death.  Patient understands risks and wishes to proceed.  Additional questions were answered, and patient is aware that post procedure patient will be going home with family and may experience cough with possible clots on expectoration as well as phlegm which may last few days as well as hoarseness of voice post intubation and mechanical ventilation.    Thank you for allowing me to participate in the care of this patient.   Patient/Family are satisfied with care plan and all questions have been answered.  This document was prepared using  [employer] training and education officer and may include unintentional dictation errors.     Ottie Glazier, M.D.  Division of [place]

## 2020-07-10 NOTE — TOC Initial Note (Signed)
Transition of Care [facility]) - Initial/Assessment Note    Patient Details  Name: Kevin Baker MRN: [id number] Date of Birth: Jul 21, 1955  [facility] CM/SW Contact:    Shelbie Ammons, RN Phone Number: 07/10/2020, 3:30 PM  Clinical Narrative:   RNCM reached out to patient by phone for assessment. Patient lives with his brother and family and reports that they have been having increasingly more trouble taking care of him. Patient reports that his amenable to SNF work up, he reports that he has never been to a SNF before and he does not have any preferences initially.  RNCM completed PASSR, FL2 and started bed search.                 Expected Discharge Plan: Skilled Nursing Facility Barriers to Discharge: Continued Medical Work up   Patient Goals and CMS Choice        Expected Discharge Plan and Services Expected Discharge Plan: [place]       Living arrangements for the past 2 months: Single Family Home                                      Prior Living Arrangements/Services Living arrangements for the past 2 months: Single Family Home Lives with:: Relatives Patient language and need for interpreter reviewed:: Yes Do you feel safe going back to the place where you live?: Yes      Need for Family Participation in Patient Care: Yes (Comment) Care giver support system in place?: Yes (comment)   Criminal Activity/Legal Involvement Pertinent to Current Situation/Hospitalization: No - Comment as needed  Activities of Daily Living Home Assistive Devices/Equipment: Cane (specify quad or straight) (straight) ADL Screening (condition at time of admission) Patient's cognitive ability adequate to safely complete daily activities?: Yes Is the patient deaf or have difficulty hearing?: No Does the patient have difficulty seeing, even when wearing glasses/contacts?: No Does the patient have difficulty concentrating, remembering, or making decisions?:  No Patient able to express need for assistance with ADLs?: Yes Does the patient have difficulty dressing or bathing?: No Independently performs ADLs?: Yes (appropriate for developmental age) Does the patient have difficulty walking or climbing stairs?: Yes Weakness of Legs: Both Weakness of Arms/Hands: None  Permission Sought/Granted                  Emotional Assessment       Orientation: : Oriented to Situation,Oriented to  Time,Oriented to Place,Oriented to Self   Psych Involvement: No (comment)  Admission diagnosis:  Left hip pain [M25.552] Patient Active Problem List   Diagnosis Date Noted  . Left hip pain 07/10/2020  . Hypokalemia   . Hypomagnesemia   . Normocytic anemia   . Palliative care encounter   . Mass of lower lobe of left lung 05/14/2020  . Left inguinal hernia 05/14/2020  . Pedal edema 05/13/2020  . Degenerative joint disease (DJD) of hip 05/13/2020  . Ambulatory dysfunction 05/13/2020  . Possible new onset of congestive heart failure ([place]) 05/13/2020  . Acute on chronic pain of left hip 05/13/2020  . Daily consumption of alcohol 05/13/2020   PCP:  Patient, No Pcp Per Pharmacy:  No Pharmacies Listed    Social Determinants of Health (SDOH) Interventions    Readmission Risk Interventions No flowsheet data found.

## 2020-07-10 NOTE — Progress Notes (Signed)
Initial Nutrition Assessment  DOCUMENTATION CODES:   Severe malnutrition in context of chronic illness  INTERVENTION:   Ensure Enlive po TID, each supplement provides 350 kcal and 20 grams of protein  MVI po daily   Dysphagia 1 diet  Pt at high refeed risk; recommend monitor potassium, magnesium and phosphorus labs daily until stable  NUTRITION DIAGNOSIS:   Severe Malnutrition related to chronic illness (etoh abuse, lung mass with suspected malignancy) as evidenced by severe muscle depletion,severe fat depletion.  GOAL:   Patient will meet greater than or equal to 90% of their needs  MONITOR:   PO intake,Supplement acceptance,Labs,Weight trends,Skin,I & O's  REASON FOR ASSESSMENT:   Malnutrition Screening Tool    ASSESSMENT:   65 y.o. 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  Met with pt in room today. Pt reports good appetite and oral intake at baseline but reports difficulty with chewing (pt is edentulous) and swallowing. Pt reports that he is only able to eat pureed consistency foods and has been eating mainly jello, mashed potatoes and pudding in hospital. Pt reports that he has 2 cases of Ensure at home but that he has not been drinking it because of the lactose. RD discussed with pt that Ensure supplements are lactose free and that they will benefit him by providing more protein and calories. Pt is willing to drink vanilla Ensure in hospital.   Per chart, pt is down 35lbs(22%) since June 2021; this is significant weight loss.   Medications reviewed and include: lovenox, nicotine  Labs reviewed: Na 133(L), K 3.1(L), P 2.0(L), Mg 1.8 wnl Hgb 10.4(L), Hct 29.1(L)  NUTRITION - FOCUSED PHYSICAL  EXAM:  Flowsheet Row Most Recent Value  Orbital Region Severe depletion  Upper Arm Region Severe depletion  Thoracic and Lumbar Region Severe depletion  Buccal Region Severe depletion  Temple Region Severe depletion  Clavicle Bone Region Severe depletion  Clavicle and Acromion Bone Region Severe depletion  Scapular Bone Region Severe depletion  Dorsal Hand Severe depletion  Patellar Region Severe depletion  Anterior Thigh Region Severe depletion  Posterior Calf Region Severe depletion  Edema (RD Assessment) None  Hair Reviewed  Eyes Reviewed  Mouth Reviewed  Skin Reviewed  Nails Reviewed     Diet Order:   Diet Order            Diet regular Room service appropriate? Yes; Fluid consistency: Thin  Diet effective now                EDUCATION NEEDS:   Education needs have been addressed  Skin:  Skin Assessment: Reviewed RN Assessment  Last BM:  2/23- TYPE 7  Height:   Ht Readings from Last 1 Encounters:  07/09/20 6' (1.829 m)    Weight:   Wt Readings from Last 1 Encounters:  07/10/20 58.9 kg    Ideal Body Weight:  80.9 kg  BMI:  Body mass index is 17.61 kg/m.  Estimated Nutritional Needs:   Kcal:  1800-2100kcal/day  Protein:  90-105g/day  Fluid:  1.7-2.0L/day  Koleen Distance MS, RD, LDN Please refer to [facility] for RD and/or RD on-call/weekend/after hours pager

## 2020-07-10 NOTE — Consult Note (Signed)
[place] CONSULT NOTE  Patient Care Team: Patient, No Pcp Per as PCP - General (General Practice)  CHIEF COMPLAINTS/PURPOSE OF CONSULTATION: Lung mass; left femoral lesion  HISTORY OF PRESENTING ILLNESS:  Kevin Baker 65 y.o.  male patient with a history of smoking, hepatitis C, alcohol use disorder and also known left lower lobe mass presents to hospital for worsening left hip pain/difficulty ambulating.  Patient was recently discharged in the hospital approximately 2 months ago-noted to have a left lower lobe mass.  Patient at the time left AGAINST MEDICAL ADVICE.   Patient states that he has quit drinking almost 2 months ago.  Also states to have cut down on smoking to about 2 packs a day.   Review of Systems  Constitutional: Positive for malaise/fatigue and weight loss. Negative for chills, diaphoresis and fever.  HENT: Negative for nosebleeds and sore throat.   Eyes: Negative for double vision.  Respiratory: Positive for cough, sputum production and shortness of breath. Negative for hemoptysis and wheezing.   Cardiovascular: Negative for chest pain, palpitations, orthopnea and leg swelling.  Gastrointestinal: Positive for diarrhea. Negative for abdominal pain, blood in stool, constipation, heartburn, melena, nausea and vomiting.  Genitourinary: Negative for dysuria, frequency and urgency.  Musculoskeletal: Positive for back pain and joint pain.  Skin: Negative.  Negative for itching and rash.  Neurological: Positive for weakness. Negative for dizziness, tingling, focal weakness and headaches.  Endo/Heme/Allergies: Does not bruise/bleed easily.  Psychiatric/Behavioral: Negative for depression. The patient is not nervous/anxious and does not have insomnia.      MEDICAL HISTORY:  Past Medical History:  Diagnosis Date  . Alcohol use disorder, severe, dependence ([place])   . Mass of lower lobe of left lung     SURGICAL HISTORY: Past Surgical History:  Procedure  Laterality Date  . BACK SURGERY    . LEG SURGERY Right   . PELVIC FRACTURE SURGERY      SOCIAL HISTORY: Social History   Socioeconomic History  . Marital status: Married    Spouse name: Not on file  . Number of children: Not on file  . Years of education: Not on file  . Highest education level: Not on file  Occupational History  . Not on file  Tobacco Use  . Smoking status: Current Every Day Smoker    Packs/day: 3.00    Types: Cigarettes  . Smokeless tobacco: Never Used  Substance and Sexual Activity  . Alcohol use: Not Currently    Comment: for last 3 weeks, drinking daily   . Drug use: Not Currently  . Sexual activity: Not on file  Other Topics Concern  . Not on file  Social History Narrative  . Not on file   Social Determinants of Health   Financial Resource Strain: Not on file  Food Insecurity: Not on file  Transportation Needs: Not on file  Physical Activity: Not on file  Stress: Not on file  Social Connections: Not on file  Intimate Partner Violence: Not on file    FAMILY HISTORY: Family History  Problem Relation Age of Onset  . Heart disease Father     ALLERGIES:  is allergic to codeine and sulfamethoxazole-trimethoprim.  MEDICATIONS:  Current Facility-Administered Medications  Medication Dose Route Frequency Provider Last Rate Last Admin  . acetaminophen (TYLENOL) tablet 1,000 mg  1,000 mg Oral TID Ralene Muskrat B, MD   1,000 mg at 07/10/20 1207  . baclofen (LIORESAL) tablet 10 mg  10 mg Oral TID PRN Zada Finders  R, MD      . buPROPion (WELLBUTRIN SR) 12 hr tablet 150 mg  150 mg Oral Daily Zada Finders R, MD   150 mg at 07/10/20 0824  . diclofenac Sodium (VOLTAREN) 1 % topical gel 2-4 g  2-4 g Topical QID Lenore Cordia, MD   2 g at 07/10/20 3267  . enoxaparin (LOVENOX) injection 40 mg  40 mg Subcutaneous Q24H Zada Finders R, MD   40 mg at 07/10/20 0825  . gabapentin (NEURONTIN) capsule 400 mg  400 mg Oral TID Ralene Muskrat B, MD      .  hydrOXYzine (VISTARIL) capsule 25 mg  25 mg Oral QID PRN Zada Finders R, MD      . morphine 2 MG/ML injection 2 mg  2 mg Intravenous Q3H PRN Sreenath, Sudheer B, MD      . nicotine (NICODERM CQ - dosed in mg/24 hours) patch 14 mg  14 mg Transdermal Daily Lenore Cordia, MD   14 mg at 07/10/20 0335  . nicotine polacrilex (COMMIT) lozenge 2 mg  2 mg Oral PRN Lenore Cordia, MD      . ondansetron (ZOFRAN) tablet 4 mg  4 mg Oral Q6H PRN Lenore Cordia, MD       Or  . ondansetron (ZOFRAN) injection 4 mg  4 mg Intravenous Q6H PRN Zada Finders R, MD      . oxyCODONE (Oxy IR/ROXICODONE) immediate release tablet 5 mg  5 mg Oral Q4H PRN Sreenath, Sudheer B, MD      . senna-docusate (Senokot-S) tablet 1 tablet  1 tablet Oral QHS PRN Lenore Cordia, MD          .  PHYSICAL EXAMINATION:  Vitals:   07/10/20 0800 07/10/20 1105  BP: 91/64 96/63  Pulse: 85 84  Resp:  19  Temp:  98 F (36.7 C)  SpO2:  98%   Filed Weights   07/09/20 1433  Weight: 150 lb (68 kg)    Physical Exam Constitutional:      Comments: Cachectic appearing male patient.  Alone.  HENT:     Head: Normocephalic and atraumatic.     Mouth/Throat:     Mouth: Oropharynx is clear and moist.     Pharynx: No oropharyngeal exudate.  Eyes:     Pupils: Pupils are equal, round, and reactive to light.  Cardiovascular:     Rate and Rhythm: Normal rate and regular rhythm.  Pulmonary:     Effort: No respiratory distress.     Breath sounds: No wheezing.     Comments: Decreased breath sounds bilaterally. Abdominal:     General: Bowel sounds are normal. There is no distension.     Palpations: Abdomen is soft. There is no mass.     Tenderness: There is no abdominal tenderness. There is no guarding or rebound.  Musculoskeletal:        General: No tenderness or edema.     Cervical back: Normal range of motion and neck supple.     Comments: Patient left hip/knee flexed complaining of severe pain.  At his hip.  Skin:    General:  Skin is warm.  Neurological:     Mental Status: He is alert and oriented to person, place, and time.  Psychiatric:        Mood and Affect: Affect normal.      LABORATORY DATA:  I have reviewed the data as listed Lab Results  Component Value Date   WBC 8.4  07/10/2020   HGB 10.4 (L) 07/10/2020   HCT 29.1 (L) 07/10/2020   MCV 93.9 07/10/2020   PLT 501 (H) 07/10/2020   Recent Labs    05/13/20 1557 05/14/20 0547 07/09/20 1513 07/09/20 2120 07/10/20 0608  NA 137 138 133*  --  133*  K 3.6 3.4* 3.0*  --  3.1*  CL 102 100 98  --  100  CO2 25 27 25   --  24  GLUCOSE 95 99 103*  --  90  BUN 9 8 26*  --  19  CREATININE 0.58* 0.60* 1.03  --  0.88  CALCIUM 8.9 9.2 9.4  --  9.1  GFRNONAA >60 >60 >60  --  >60  PROT 6.9  --   --  7.2  --   ALBUMIN 3.3*  --   --  3.4*  --   AST 17  --   --  19  --   ALT 7  --   --  16  --   ALKPHOS 50  --   --  66  --   BILITOT 0.8  --   --  0.5  --   BILIDIR  --   --   --  <0.1  --   IBILI  --   --   --  NOT CALCULATED  --     RADIOGRAPHIC STUDIES: I have personally reviewed the radiological images as listed and agreed with the findings in the report. CT Hip Left Wo Contrast  Result Date: 07/10/2020 CLINICAL DATA:  Left leg and arm weakness with pain since March 2021, negative radiographs EXAM: CT OF THE LEFT HIP WITHOUT CONTRAST TECHNIQUE: Multidetector CT imaging of the left hip was performed according to the standard protocol. Multiplanar CT image reconstructions were also generated. COMPARISON:  Radiograph 07/09/2020, CT 05/14/2020 FINDINGS: Bones/Joint/Cartilage The osseous structures appear diffusely demineralized which may limit detection of small or nondisplaced fractures. Ovoid lucent lesion centered within the lesser trochanter measuring approximately 2.6 x 2.3 x 3.1 cm in size. No clear associated pathologic fracture is evident at this time. No other suspicious lytic or blastic lesions within the included margins of imaging. No other acute  bony abnormality. Specifically, no fracture, subluxation, or dislocation of the left hip or included bony pelvis. More remote remote posttraumatic changes of the left pubic root, superior and inferior rami, unchanged from comparison CT. Ankylosis left SI joint and likely prior traumatic deformity of the left ilium. Degenerative change at the pubic symphysis and left hip is similar to comparison. No sizable hip effusion. Ligaments Suboptimally assessed by CT. Muscles and Tendons No acute musculotendinous abnormality is seen. Soft tissues Atherosclerotic calcifications in the iliac arteries and proximal common and superficial femoral arteries. Circumferential thickening of the urinary bladder though possibly related to underdistention. Fluid-filled appearance of the colon, correlate for rapid transit state. IMPRESSION: 1. Ovoid lucent lesion centered within the left lesser trochanter measuring approximately 2.6 x 2.3 x 3.1 cm in size. No clear associated pathologic fracture is evident at this time. Findings concerning for metastatic lesion given known left lung lesion. 2. Remote posttraumatic changes of the left pubic root, superior and inferior rami, and left ilium unchanged from comparison CT. 3. Ankylosis left SI joint. 4. Circumferential thickening of the urinary bladder though possibly related to underdistention. Correlate with urinalysis to exclude cystitis. 5. Fluid-filled appearance of the colon, could reflect diarrheal illness/rapid transit state. 6. Atherosclerosis These results were called by telephone at the time of interpretation on 07/10/2020  at 12:23 am to provider Dr. Tamala Julian, who verbally acknowledged these results. Electronically Signed   By: Lovena Le M.D.   On: 07/10/2020 00:23   DG Hip Unilat W or Wo Pelvis 2-3 Views Left  Result Date: 07/09/2020 CLINICAL DATA:  Acute on chronic hip pain EXAM: DG HIP (WITH OR WITHOUT PELVIS) 2-3V LEFT COMPARISON:  05/13/2020 FINDINGS: Chronic deformity of left  iliac bone and pubic rami. No acute displaced fracture or malalignment is seen. There are mild degenerative changes of both hips. Vascular calcifications. IMPRESSION: No acute osseous abnormality. Chronic deformity of left iliac bone and pubic rami. Electronically Signed   By: Donavan Foil M.D.   On: 07/09/2020 21:25    Mass of lower lobe of left lung #65 year old male patient history of smoking/alcohol is currently in the hospital for worsening left-sided hip pain; noted to have a large left lung mass.  #45 cm left lung lower lobe mass-highly suspicious for malignancy-primary lung cancer.   #Left hip pain-soft tissue lesions noted in the femoral area likely secondary to metastatic disease.[name]  Poorly controlled  #History of smoking/alcohol  Recommendation:  #Recommend evaluation with orthopedics-patient may need stabilization/surgery.  If patient is a candidate for surgery-biopsy/pathology could be performed the same time.  If no surgical option available-then radiation could be offered.  We will also will need pulmonary evaluation for biopsy planning.  #Recommend evaluation with palliative care for symptom management; goals of care.   # Thank you Dr.Sreenath for allowing me to participate in the care of your pleasant patient. Please do not hesitate to contact me with questions or concerns in the interim.  Discussed with Dr. Priscella Mann. Discussed with [employer].  I will reach out to patient's nephew Legrand Como.   # I reviewed the blood work- with the patient in detail; also reviewed the imaging independently [as summarized above]; and with the patient in detail.     All questions were answered. The patient knows to call the clinic with any problems, questions or concerns.    Cammie Sickle, MD 07/10/2020 1:25 PM

## 2020-07-10 NOTE — Assessment & Plan Note (Addendum)
65 year old male patient history of smoking/alcohol is currently in the hospital for worsening left-sided hip pain; noted to have a large left lung mass.  # 4-5 cm left lung lower lobe mass-s/p bronchoscopy 2/25-[Dr.A]-positive for malignancy.  Awaiting immunohistochemical stains; ordered foundation 1 testing.   #Left hip pain-soft tissue lesions noted in the femoral area likely secondary to metastatic disease status post evaluation with radiation oncology; awaiting simulation today.  Discussed with Dr. Donella Stade recommend 5 fractions of radiations.  Continue narcotic pain medication/steroids-as per primary team/palliative care. Discussed with [employer].   #Disposition: Unfortunately patient has poor social support; given his difficulty with ambulation I think it is reasonable to consider rehab.  Discussed with patient that transportation for radiation could be a deterrent for his rehab placement.  Discussed with Dr. Priscella Mann.

## 2020-07-10 NOTE — Telephone Encounter (Signed)
On 2/23- I spoke to pt's nephew Legrand Como updated on pt's progress.   GB

## 2020-07-10 NOTE — Plan of Care (Signed)
New care plan initiated 

## 2020-07-10 NOTE — Consult Note (Signed)
[facility]  [phone] [phone]   Name: Kevin Baker Date: 07/10/2020 MRN: [id number]  DOB: 1955/12/22  Patient Care Team: Patient, No Pcp Per as PCP - General (General Practice)    REASON FOR CONSULTATION: Kevin Baker is a 65 y.o. 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  Patient was hospitalized 05/13/2020 to 05/14/2020 with progressive left groin pain.  He was found to have an inguinal hernia, which was reducible and did not require surgery.  CT of the chest revealed a left lower lobe mass concerning for primary bronchogenic carcinoma.  Patient also had several nodules in the left upper lobe and left posterior costophrenic sulcus.  He ultimately left AMA.  Patient was again hospitalized 07/09/2020 with worsening left hip pain.  CT of the left hip revealed a metastatic lesion in the left trochanter.  Palliative care is consulted to help address goals and manage ongoing symptoms.  SOCIAL HISTORY:     reports that he has been smoking cigarettes. He has been smoking about 3.00 packs per day. He has never used smokeless tobacco. He reports previous alcohol use. He reports previous drug use.  Patient is married but separated from his wife.  His wife currently lives in [place].  Patient has been married 11 times to 61 different women.  He has a daughter who lives in [place] and another and [place].  Patient lives at home with his brother and sister-in-law.  His sister-in-law is currently under hospice care for terminal cancer.  Patient is a veteran from the [facility] and served during the [place] war.  He receives health care through the [place].  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  ADVANCE DIRECTIVES:  None on file  CODE STATUS:  Full code  PAST MEDICAL HISTORY: Past Medical History:  Diagnosis Date  . Alcohol use disorder, severe, dependence ([place])   . Mass of lower lobe of left lung     PAST SURGICAL HISTORY:  Past Surgical History:  Procedure Laterality Date  . BACK SURGERY    . LEG SURGERY Right   . PELVIC FRACTURE SURGERY      HEMATOLOGY/ONCOLOGY HISTORY:  Oncology History   No history exists.    ALLERGIES:  is allergic to codeine and sulfamethoxazole-trimethoprim.  MEDICATIONS:  Current Facility-Administered Medications  Medication Dose Route Frequency Provider Last Rate Last Admin  . acetaminophen (TYLENOL) tablet 1,000 mg  1,000 mg Oral TID Ralene Muskrat B, MD   1,000 mg at 07/10/20 1207  . baclofen (LIORESAL) tablet 10 mg  10 mg Oral TID PRN Lenore Cordia, MD      . buPROPion [facility] SR) 12 hr tablet 150 mg  150 mg Oral Daily Lenore Cordia, MD   150 mg at 07/10/20 0824  . dexamethasone (DECADRON) tablet 2 mg  2 mg Oral Q12H Heavenleigh Petruzzi, Kirt Boys, NP      . diclofenac Sodium (VOLTAREN) 1 % topical gel 2-4 g  2-4 g Topical QID Lenore Cordia, MD   4 g at 07/10/20 1414  . enoxaparin (LOVENOX) injection 40 mg  40 mg Subcutaneous Q24H Zada Finders R, MD   40 mg at 07/10/20 0825  . gabapentin (NEURONTIN) capsule 400 mg  400 mg Oral TID Sidney Ace, MD      .  hydrOXYzine (VISTARIL) capsule 25 mg  25 mg Oral QID PRN Zada Finders R, MD      . morphine 2 MG/ML injection 2 mg  2 mg Intravenous Q3H PRN Sreenath, Sudheer B, MD      . nicotine (NICODERM CQ - dosed in mg/24 hours) patch 14 mg  14 mg Transdermal Daily Lenore Cordia, MD   14 mg at 07/10/20 0335  . nicotine polacrilex (COMMIT) lozenge 2 mg  2 mg Oral PRN Lenore Cordia, MD      . ondansetron (ZOFRAN) tablet 4 mg  4 mg Oral Q6H PRN Lenore Cordia, MD       Or  . ondansetron (ZOFRAN) injection 4 mg  4 mg Intravenous Q6H PRN Zada Finders R, MD      . oxyCODONE (Oxy IR/ROXICODONE) immediate release tablet 5 mg  5 mg Oral Q4H  PRN Priscella Mann, Sudheer B, MD      . senna-docusate (Senokot-S) tablet 1 tablet  1 tablet Oral QHS PRN Lenore Cordia, MD        VITAL SIGNS: BP 96/63 (BP Location: Left Arm)   Pulse 84   Temp 98 F (36.7 C)   Resp 19   Ht 6' (1.829 m)   Wt 150 lb (68 kg)   SpO2 98%   BMI 20.34 kg/m  Filed Weights   07/09/20 1433  Weight: 150 lb (68 kg)    Estimated body mass index is 20.34 kg/m as calculated from the following:   Height as of this encounter: 6' (1.829 m).   Weight as of this encounter: 150 lb (68 kg).  LABS: CBC:    Component Value Date/Time   WBC 8.4 07/10/2020 0608   HGB 10.4 (L) 07/10/2020 0608   HCT 29.1 (L) 07/10/2020 0608   PLT 501 (H) 07/10/2020 0608   MCV 93.9 07/10/2020 0608   Comprehensive Metabolic Panel:    Component Value Date/Time   NA 133 (L) 07/10/2020 0608   K 3.1 (L) 07/10/2020 0608   CL 100 07/10/2020 0608   CO2 24 07/10/2020 0608   BUN 19 07/10/2020 0608   CREATININE 0.88 07/10/2020 0608   GLUCOSE 90 07/10/2020 0608   CALCIUM 9.1 07/10/2020 0608   AST 19 07/09/2020 2120   ALT 16 07/09/2020 2120   ALKPHOS 66 07/09/2020 2120   BILITOT 0.5 07/09/2020 2120   PROT 7.2 07/09/2020 2120   ALBUMIN 3.4 (L) 07/09/2020 2120    RADIOGRAPHIC STUDIES: CT Hip Left Wo Contrast  Result Date: 07/10/2020 CLINICAL DATA:  Left leg and arm weakness with pain since March 2021, negative radiographs EXAM: CT OF THE LEFT HIP WITHOUT CONTRAST TECHNIQUE: Multidetector CT imaging of the left hip was performed according to the standard protocol. Multiplanar CT image reconstructions were also generated. COMPARISON:  Radiograph 07/09/2020, CT 05/14/2020 FINDINGS: Bones/Joint/Cartilage The osseous structures appear diffusely demineralized which may limit detection of small or nondisplaced fractures. Ovoid lucent lesion centered within the lesser trochanter measuring approximately 2.6 x 2.3 x 3.1 cm in size. No clear associated pathologic fracture is evident at this time. No  other suspicious lytic or blastic lesions within the included margins of imaging. No other acute bony abnormality. Specifically, no fracture, subluxation, or dislocation of the left hip or included bony pelvis. More remote remote posttraumatic changes of the left pubic root, superior and inferior rami, unchanged from comparison CT. Ankylosis left SI joint and likely prior traumatic deformity of the left ilium. Degenerative change at the pubic symphysis and  left hip is similar to comparison. No sizable hip effusion. Ligaments Suboptimally assessed by CT. Muscles and Tendons No acute musculotendinous abnormality is seen. Soft tissues Atherosclerotic calcifications in the iliac arteries and proximal common and superficial femoral arteries. Circumferential thickening of the urinary bladder though possibly related to underdistention. Fluid-filled appearance of the colon, correlate for rapid transit state. IMPRESSION: 1. Ovoid lucent lesion centered within the left lesser trochanter measuring approximately 2.6 x 2.3 x 3.1 cm in size. No clear associated pathologic fracture is evident at this time. Findings concerning for metastatic lesion given known left lung lesion. 2. Remote posttraumatic changes of the left pubic root, superior and inferior rami, and left ilium unchanged from comparison CT. 3. Ankylosis left SI joint. 4. Circumferential thickening of the urinary bladder though possibly related to underdistention. Correlate with urinalysis to exclude cystitis. 5. Fluid-filled appearance of the colon, could reflect diarrheal illness/rapid transit state. 6. Atherosclerosis These results were called by telephone at the time of interpretation on 07/10/2020 at 12:23 am to provider Dr. Tamala Julian, who verbally acknowledged these results. Electronically Signed   By: Lovena Le M.D.   On: 07/10/2020 00:23   DG Hip Unilat W or Wo Pelvis 2-3 Views Left  Result Date: 07/09/2020 CLINICAL DATA:  Acute on chronic hip pain EXAM: DG  HIP (WITH OR WITHOUT PELVIS) 2-3V LEFT COMPARISON:  05/13/2020 FINDINGS: Chronic deformity of left iliac bone and pubic rami. No acute displaced fracture or malalignment is seen. There are mild degenerative changes of both hips. Vascular calcifications. IMPRESSION: No acute osseous abnormality. Chronic deformity of left iliac bone and pubic rami. Electronically Signed   By: Donavan Foil M.D.   On: 07/09/2020 21:25    PERFORMANCE STATUS (ECOG) : 1 - Symptomatic but completely ambulatory  Review of Systems Unless otherwise noted, a complete review of systems is negative.  Physical Exam General: NAD Pulmonary: Unlabored Extremities: no edema, no joint deformities Skin: no rashes Neurological: Weakness but otherwise nonfocal  IMPRESSION: I met with patient.  Introduced palliative care services and attempted to establish therapeutic rapport.  Symptomatically, he reports pain is improved as long as he is in bed.  Movement and coughing still trigger intense pain in the left hip.  Patient was started on oxycodone during his hospitalization and reports that it is helpful at making the pain tolerable.  Patient is also taking gabapentin due to presumed neuropathic component to the pain as he describes electricity shooting down the leg.  Ortho consult is pending.  Given presumed metastasis to bone, will start patient on low-dose dexamethasone to help with inflammatory pain.  Recommend continuing Percocet as needed.  Can consider starting him on a long-acting opioid if needed.  At baseline, patient lives at home with his brother.  He was functionally independent with his own care.  Patient was a heavy drinker of alcohol (mostly whiskey and vodka) but says that he has not consumed alcohol in several weeks.  He was also a heavy smoker but has been trying to cut back.  Patient says that he is still legally married but is trying to get a divorce from his wife.  She lives in [place].  He would not want her  involved in his healthcare.  Patient is interested in completing advance directives and would like his brother to service his healthcare power of attorney.  We discussed CODE STATUS.  Patient wants time to consider decisions but does not think that he would want heroic or aggressive measures at end-of-life if  care were deemed to be futile.  Patient understands that he likely has an advanced malignancy.  He is interested in "doing what ever it takes."  He verbalized a desire to pursue work-up and treatment if any options are available.  PLAN: -Continue current scope of treatment -Continue oxycodone as needed for pain -Start dexamethasone 2 mg twice daily -Ortho consult pending -Chaplain to assist with ACP documents -Will follow  Case and plan discussed with Dr. Rogue Bussing  Patient expressed understanding and was in agreement with this plan. He also understands that He can call the clinic at any time with any questions, concerns, or complaints.     Time Total: 60 minutes  Visit consisted of counseling and education dealing with the complex and emotionally intense issues of symptom management and palliative care in the setting of serious and potentially life-threatening illness.Greater than 50%  of this time was spent counseling and coordinating care related to the above assessment and plan.  Signed by: Altha Harm, PhD, NP-C

## 2020-07-10 NOTE — Evaluation (Signed)
Physical Therapy Evaluation Patient Details Name: Kevin Baker MRN: [id number] DOB: 19-Jun-1955 Today's Date: 07/10/2020   History of Present Illness  65 y.o. 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  Clinical Impression  Pt struggled with PT exam.  On arrival he reports that he had been able to do some limited ambulation in the home with [facility] recently but now even that has been difficult and brother has had to take care of him a lot.  Pt with L hip flexed to ~120 deg on arrival and struggled to get this down to appropriate sitting position/90* and maintained very guarded hip flexion position t/o most of the session.  He showed reasonable effort in trying to do mobility but simply was too pain limited, guarded and reactionary to safely do any w/o extensive encouragement/cuing and ultimately needed heavy assist with each transfer/transition.     Follow Up Recommendations SNF;Supervision/Assistance - 24 hour    Equipment Recommendations   (TBD at next venue of care)    Recommendations for Other Services       Precautions / Restrictions Precautions Precautions: Fall Restrictions Weight Bearing Restrictions: No Other Position/Activity Restrictions: Pt extremely hesitant, essentially unable to put weight on the L LE      Mobility  Bed Mobility Overal bed mobility: Needs Assistance Bed Mobility: Supine to Sit     Supine to sit: Min assist     General bed mobility comments: Pt painfully slow and guarded in getting to EOB.  He did not need a lot of assist but constant encouragement/cuing and again was unable to get L hip below 90 deg to get to normal  sitting position    Transfers Overall transfer level: Needs assistance Equipment used: Rolling walker (2 wheeled) Transfers: Sit to/from [employer] Sit to Stand: Mod assist   Squat pivot transfers: Mod assist;Max assist     General transfer comment: Pt again extremely hesitant with standing.  He attempted to stand at least 5 times needing to abort the effort each time as he could not get (and/or) tolerate L foot to the floor.  Ultimately he needed moderate assist with walker to get up and try to stand but could not maintain.  eventually got to bed side commode with highly assisted squat pivot transfer.  Max assist SPT from [facility] to recliner  Ambulation/Gait             General Gait Details: unable to tolerate WBing on L, stand tall enough to effectively use AD and generally c/o pain with any movement.  Stairs            Wheelchair Mobility    Modified Rankin (Stroke Patients Only)       Balance Overall balance assessment: Needs assistance Sitting-balance support: Bilateral upper extremity supported Sitting balance-Leahy Scale: Fair Sitting balance - Comments: Pt unable to get L hip to 90 flexion sitting position, very guarded and calling out in pain but able to maintain sitting at EOB w/o direct assist     Standing balance-Leahy Scale: Zero Standing balance comment: unable to support himself in standing, knees buckling or (L) unable to accept weight.  Inappropriate use of ADs  Pertinent Vitals/Pain Pain Assessment: 0-10 Pain Score: 8  Pain Location: L hip    Home Living Family/patient expects to be discharged to:: Unsure                      Prior Function Level of Independence: Needs assistance   Gait / Transfers Assistance Needed: apparently he has been having a harder and harder time trying to manage/mobilize in the home.  Apparently holds/advances L LE with UEs and uses a cane in the R  hand.  ADL's / Homemaking Assistance Needed: apparently brother has been having to clean him up after copious loose BMs, helping with most ADLs        Hand Dominance        Extremity/Trunk Assessment   Upper Extremity Assessment Upper Extremity Assessment: Generalized weakness    Lower Extremity Assessment Lower Extremity Assessment: Generalized weakness (pt keeping L hip in flexion (>90 most of the time) with extreme caution/pain/hesitancy to to any extension at the hip - unable to attain 0deg/neutral)       Communication   Communication: No difficulties  Cognition Arousal/Alertness: Awake/alert Behavior During Therapy: Anxious;Restless Overall Cognitive Status: Within Functional Limits for tasks assessed                                        General Comments      Exercises     Assessment/Plan    PT Assessment Patient needs continued PT services  PT Problem List Decreased strength;Decreased range of motion;Decreased activity tolerance;Decreased balance;Decreased mobility;Decreased coordination;Decreased cognition;Decreased knowledge of use of DME;Decreased safety awareness;Pain       PT Treatment Interventions DME instruction;Gait training;Stair training;Functional mobility training;Therapeutic activities;Therapeutic exercise;Balance training;Neuromuscular re-education;Patient/family education    PT Goals (Current goals can be found in the Care Plan section)  Acute Rehab PT Goals Patient Stated Goal: Get back to walking PT Goal Formulation: With patient Time For Goal Achievement: 07/24/20 Potential to Achieve Goals: Fair    Frequency Min 2X/week   Barriers to discharge        Co-evaluation               AM-PAC PT "6 Clicks" Mobility  Outcome Measure Help needed turning from your back to your side while in a flat bed without using bedrails?: A Little Help needed moving from lying on your back to sitting on the side of a flat bed  without using bedrails?: A Little Help needed moving to and from a bed to a chair (including a wheelchair)?: A Lot Help needed standing up from a chair using your arms (e.g., wheelchair or bedside chair)?: A Lot Help needed to walk in hospital room?: Total Help needed climbing 3-5 steps with a railing? : Total 6 Click Score: 12    End of Session Equipment Utilized During Treatment: Gait belt Activity Tolerance: Patient limited by pain Patient left: with chair alarm set;with call bell/phone within reach Nurse Communication: Mobility status PT Visit Diagnosis: Muscle weakness (generalized) (M62.81);Difficulty in walking, not elsewhere classified (R26.2);Pain Pain - Right/Left: Left Pain - part of body: Hip    Time: 0902-0940 PT Time Calculation (min) (ACUTE ONLY): 38 min   Charges:   PT Evaluation $PT Eval Low Complexity: 1 Low PT Treatments $Therapeutic Activity: 23-37 mins        Kreg Shropshire, DPT 07/10/2020, 12:48 PM

## 2020-07-10 NOTE — Consult Note (Signed)
ORTHOPAEDIC CONSULTATION  REQUESTING PHYSICIAN: Sidney Ace, MD  Chief Complaint:   L leg pain  History of Present Illness: Kevin Baker is a 65 y.o. male Left lower extremity pain that has significantly worsened over the past 3 months.  He was initially evaluated by Dr. Candelaria Stagers at [facility] in December 2021.  He was noted to have degenerative changes of his hip joint likely from sequelae of prior injury, and most of his pain was in the groin region at that time consistent with posttraumatic arthritis.  He then had an ultrasound-guided corticosteroid injection to the hip joint.  He was then admitted to the hospital on 05/13/2020 and was found to have a possible mass in the lungs concerning for carcinoma.  He left AMA due to possible alcohol withdrawal.  He was then evaluated as an outpatient by Dr. Rudene Christians on 06/19/2020 where pain seem to be more neurogenic in nature.  He was noted to have inability to flex the left hip or flex/extend the left knee without any significant clonus.  An EMG/NCS was recommended, but this has not been obtained yet. He reports progressive worsening of his pain leading to multiple falls and inability to ambulate.  Currently, pain is primary located about the knee and less so the groin region.  He also states he has significant difficulty extending his knee.  Imaging in the emergency department showed a lytic lesion about the lesser trochanter on the left side concerning for metastatic lesion with primary source likely being the lung.  Of note, the patient states he is also lost approximately 30 pounds over the past 3-4 months.  He also states he has quit drinking alcohol. He also has a history of hepatitis C.    Past Medical History:  Diagnosis Date  . Alcohol use disorder, severe, dependence ([place])   . Mass of lower lobe of left lung    Past Surgical History:  Procedure Laterality Date  . BACK  SURGERY    . LEG SURGERY Right   . PELVIC FRACTURE SURGERY     Social History   Socioeconomic History  . Marital status: Married    Spouse name: Not on file  . Number of children: Not on file  . Years of education: Not on file  . Highest education level: Not on file  Occupational History  . Not on file  Tobacco Use  . Smoking status: Current Every Day Smoker    Packs/day: 3.00    Types: Cigarettes  . Smokeless tobacco: Never Used  Substance and Sexual Activity  . Alcohol use: Not Currently    Comment: for last 3 weeks, drinking daily   . Drug use: Not Currently  . Sexual activity: Not on file  Other Topics Concern  . Not on file  Social History Narrative  . Not on file   Social Determinants of Health   Financial Resource Strain: Not on file  Food Insecurity: Not on file  Transportation Needs: Not on file  Physical Activity: Not on file  Stress: Not on file  Social Connections: Not on file   Family History  Problem Relation Age of Onset  . Heart disease Father    Allergies  Allergen Reactions  . Codeine Nausea And Vomiting  . Sulfamethoxazole-Trimethoprim Nausea And Vomiting   Prior to Admission medications   Medication Sig Start Date End Date Taking? Authorizing Provider  acetaminophen (TYLENOL) 325 MG tablet Take 500 mg by mouth every 6 (six) hours as needed. 11/21/18  Yes  [provider]  baclofen (LIORESAL) 10 MG tablet Take 10 mg by mouth 3 (three) times daily as needed for muscle spasms. 11/21/18  Yes [provider]  buPROPion (WELLBUTRIN SR) 150 MG 12 hr tablet Take 150 mg by mouth daily. 11/21/18  Yes [provider]  Cholecalciferol 50 MCG (2000 UT) TABS Take 2,000 Units by mouth daily. 10/26/19  Yes [provider]  diclofenac Sodium (VOLTAREN) 1 % GEL Apply 2-4 g topically 4 (four) times daily as needed. 04/17/20  Yes [provider]  folic acid (FOLVITE) 1 MG tablet Take 1 mg by mouth daily. 11/29/18  Yes [provider]  gabapentin (NEURONTIN) 300 MG capsule Take 300 mg by mouth 3 (three) times daily. 06/19/20  Yes [provider]  hydrOXYzine (VISTARIL) 25 MG capsule Take 25 mg by mouth 4 (four) times daily as needed for anxiety. 08/09/19  Yes [provider]  naloxone (NARCAN) nasal spray 4 mg/0.1 mL SPRAY 1 SPRAY INTO ONE NOSTRIL AS DIRECTED FOR OPIOID OVERDOSE (TURN PERSON ON SIDE AFTER DOSE. IF NO RESPONSE IN 2-3 MINUTES OR PERSON RESPONDS BUT RELAPSES, REPEAT USING A NEW SPRAY DEVICE AND SPRAY INTO THE OTHER NOSTRIL. CALL 911 AFTER USE.) * EMERGENCY USE ONLY * 11/01/19  Yes [provider]  nicotine polacrilex (COMMIT) 4 MG lozenge DISSOLVE 1 LOZENGE MOUTH EVERY HOUR AS NEEDED TO HELP STOP SMOKING ** CALL [facility] [phone] FOR INFORMATION ABOUT SUPPORT IN QUITTING.  - MAX 24 PIECES IN 24 HOURS TO HELP STOP SMOKING ** CALL [facility] [phone] FOR INFORMATION ABOUT SUPPORT IN QUITTING.  - MAX 24 PIECES IN 24 HOURS 11/21/18  Yes [provider]  traZODone (DESYREL) 50 MG tablet Take 50 mg by mouth at bedtime as needed. 12/15/18  Yes [provider]  lidocaine (LIDODERM) 5 % Place 1 patch onto the skin See admin instructions. Apply 1 patch to the skin once a day to painful areas for 12 hours, then remove. Patient not taking: Reported on 07/10/2020 12/07/18   [provider]  naltrexone (DEPADE) 50 MG tablet Take 1 tablet by mouth daily. Patient not taking: Reported on 07/10/2020 12/15/18   [provider]  omeprazole (PRILOSEC) 20 MG capsule Take 20 mg by mouth daily. Patient not taking: Reported on 07/10/2020 10/26/19   [provider]   Recent Labs    07/09/20 1513 07/10/20 0608  WBC 8.6 8.4  HGB 9.9* 10.4*  HCT 28.7* 29.1*  PLT 509* 501*  K 3.0* 3.1*  CL 98 100  CO2 25 24  BUN 26* 19  CREATININE 1.03 0.88  GLUCOSE 103* 90  CALCIUM 9.4 9.1   CT Hip Left Wo Contrast  Result Date: 07/10/2020 CLINICAL  DATA:  Left leg and arm weakness with pain since March 2021, negative radiographs EXAM: CT OF THE LEFT HIP WITHOUT CONTRAST TECHNIQUE: Multidetector CT imaging of the left hip was performed according to the standard protocol. Multiplanar CT image reconstructions were also generated. COMPARISON:  Radiograph 07/09/2020, CT 05/14/2020 FINDINGS: Bones/Joint/Cartilage The osseous structures appear diffusely demineralized which may limit detection of small or nondisplaced fractures. Ovoid lucent lesion centered within the lesser trochanter measuring approximately 2.6 x 2.3 x 3.1 cm in size. No clear associated pathologic fracture is evident at this time. No other suspicious lytic or blastic lesions within the included margins of imaging. No other acute bony abnormality. Specifically, no fracture, subluxation, or dislocation of the left hip or included bony pelvis. More remote remote  posttraumatic changes of the left pubic root, superior and inferior rami, unchanged from comparison CT. Ankylosis left SI joint and likely prior traumatic deformity of the left ilium. Degenerative change at the pubic symphysis and left hip is similar to comparison. No sizable hip effusion. Ligaments Suboptimally assessed by CT. Muscles and Tendons No acute musculotendinous abnormality is seen. Soft tissues Atherosclerotic calcifications in the iliac arteries and proximal common and superficial femoral arteries. Circumferential thickening of the urinary bladder though possibly related to underdistention. Fluid-filled appearance of the colon, correlate for rapid transit state. IMPRESSION: 1. Ovoid lucent lesion centered within the left lesser trochanter measuring approximately 2.6 x 2.3 x 3.1 cm in size. No clear associated pathologic fracture is evident at this time. Findings concerning for metastatic lesion given known left lung lesion. 2. Remote posttraumatic changes of the left pubic root, superior and inferior rami, and left ilium unchanged  from comparison CT. 3. Ankylosis left SI joint. 4. Circumferential thickening of the urinary bladder though possibly related to underdistention. Correlate with urinalysis to exclude cystitis. 5. Fluid-filled appearance of the colon, could reflect diarrheal illness/rapid transit state. 6. Atherosclerosis These results were called by telephone at the time of interpretation on 07/10/2020 at 12:23 am to provider Dr. Tamala Julian, who verbally acknowledged these results. Electronically Signed   By: Lovena Le M.D.   On: 07/10/2020 00:23   DG Hip Unilat W or Wo Pelvis 2-3 Views Left  Result Date: 07/09/2020 CLINICAL DATA:  Acute on chronic hip pain EXAM: DG HIP (WITH OR WITHOUT PELVIS) 2-3V LEFT COMPARISON:  05/13/2020 FINDINGS: Chronic deformity of left iliac bone and pubic rami. No acute displaced fracture or malalignment is seen. There are mild degenerative changes of both hips. Vascular calcifications. IMPRESSION: No acute osseous abnormality. Chronic deformity of left iliac bone and pubic rami. Electronically Signed   By: Donavan Foil M.D.   On: 07/09/2020 21:25     Positive ROS: All other systems have been reviewed and were otherwise negative with the exception of those mentioned in the HPI and as above.  Physical Exam: BP 96/63 (BP Location: Left Arm)   Pulse 84   Temp 98 F (36.7 C)   Resp 19   Ht 6' (1.829 m)   Wt 68 kg   SpO2 98%   BMI 20.34 kg/m  General:  Alert, no acute distress Psychiatric:  Patient is competent for consent with normal mood and affect   Cardiovascular:  No pedal edema, regular rate and rhythm Respiratory:  No wheezing, non-labored breathing GI:  Abdomen is soft and non-tender Skin:  No lesions in the area of chief complaint, no erythema Neurologic:  Sensation intact distally, CN grossly intact Lymphatic:  No axillary or cervical lymphadenopathy  Orthopedic Exam:  LLE: 5/5 DF/PF/EHL, Able to fire quadriceps, but unable to extend leg when the knee is in a flexed  position.  He is able to flex his hip SILT L5-S1, mildly diminished sensation in L3-L4 Foot wwp RoM   Imaging:  As above: Lytic lesion involving the lesser trochanter on the left side.  This lesion does not involve greater than 50% of the cortex.  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 1.  He may weight-bear as tolerated as the current size and location of the lesion does not make it an impending pathologic fracture.  Therefore, I would not recommend prophylactic fixation at this time.  Consider palliative radiation instead.  2. Additionally, his pain  seems to be multifactorial in nature.  He has significant degenerative changes to the left hip joint which generally would manifest as groin pain with possible knee pain as well.  Pain from lytic lesion would likely be more thigh pain with weightbearing as opposed to simply range of motion.  Lastly he has significant weakness and slight numbness in the L4 distribution which could be due to a neurologic etiology.  I would also recommend an MRI of his lumbar spine and/or nerve conduction study as previously had been ordered as an outpatient for further evaluation of this with subsequent neurology vs neurosurgery consultation pending MRI results.   3. PT/OT to help with mobilization  4. Please page with any questions.     Leim Fabry   07/10/2020 1:55 PM

## 2020-07-10 NOTE — NC FL2 (Signed)
[place] LEVEL OF CARE SCREENING TOOL     IDENTIFICATION  Patient Name: Kevin Baker Birthdate: 1955-06-08 Sex: male Admission Date (Current Location): 07/09/2020  [place] and [place] Number:  Engineering geologist and Address:  [facility], [address] [place], [place] [address]      Provider Number: [id number]  Attending Physician Name and Address:  Sidney Ace, MD  Relative Name and Phone Number:  Rawson Minix [phone]    Current Level of Care: Hospital Recommended Level of Care: [place] Prior Approval Number:    Date Approved/Denied:   PASRR Number: [id number]  Discharge Plan: SNF    Current Diagnoses: Patient Active Problem List   Diagnosis Date Noted  . Left hip pain 07/10/2020  . Hypokalemia   . Hypomagnesemia   . Normocytic anemia   . Palliative care encounter   . Mass of lower lobe of left lung 05/14/2020  . Left inguinal hernia 05/14/2020  . Pedal edema 05/13/2020  . Degenerative joint disease (DJD) of hip 05/13/2020  . Ambulatory dysfunction 05/13/2020  . Possible new onset of congestive heart failure ([place]) 05/13/2020  . Acute on chronic pain of left hip 05/13/2020  . Daily consumption of alcohol 05/13/2020    Orientation RESPIRATION BLADDER Height & Weight     Self,Situation,Time,Place  Normal External catheter Weight: 68 kg Height:  6' (182.9 cm)  BEHAVIORAL SYMPTOMS/MOOD NEUROLOGICAL BOWEL NUTRITION STATUS      Incontinent Diet (Regular, chopped, no teeth)  AMBULATORY STATUS COMMUNICATION OF NEEDS Skin   Extensive Assist Verbally Normal                       Personal Care Assistance Level of Assistance  Bathing,Feeding,Dressing Bathing Assistance: Maximum assistance Feeding assistance: Limited assistance Dressing Assistance: Maximum assistance     Functional Limitations Info  Sight,Hearing,Speech Sight Info: Adequate Hearing Info: Adequate Speech Info:  Adequate    SPECIAL CARE FACTORS FREQUENCY  PT (By licensed PT),OT (By licensed OT)                    Contractures Contractures Info: Not present    Additional Factors Info  Code Status,Allergies Code Status Info: Full Allergies Info: Codeine, Sulfamethoxazole-trimethoprim           Current Medications (07/10/2020):  This is the current hospital active medication list Current Facility-Administered Medications  Medication Dose Route Frequency Provider Last Rate Last Admin  . acetaminophen (TYLENOL) tablet 1,000 mg  1,000 mg Oral TID Ralene Muskrat B, MD   1,000 mg at 07/10/20 1207  . baclofen (LIORESAL) tablet 10 mg  10 mg Oral TID PRN Lenore Cordia, MD      . buPROPion [facility] SR) 12 hr tablet 150 mg  150 mg Oral Daily Lenore Cordia, MD   150 mg at 07/10/20 0824  . dexamethasone (DECADRON) tablet 2 mg  2 mg Oral Q12H Borders, Kirt Boys, NP      . diclofenac Sodium (VOLTAREN) 1 % topical gel 2-4 g  2-4 g Topical QID Lenore Cordia, MD   4 g at 07/10/20 1414  . enoxaparin (LOVENOX) injection 40 mg  40 mg Subcutaneous Q24H Zada Finders R, MD   40 mg at 07/10/20 0825  . gabapentin (NEURONTIN) capsule 400 mg  400 mg Oral TID Ralene Muskrat B, MD      . hydrOXYzine (VISTARIL) capsule 25 mg  25 mg Oral QID PRN Lenore Cordia,  MD      . morphine 2 MG/ML injection 2 mg  2 mg Intravenous Q3H PRN Sreenath, Sudheer B, MD      . nicotine (NICODERM CQ - dosed in mg/24 hours) patch 14 mg  14 mg Transdermal Daily Lenore Cordia, MD   14 mg at 07/10/20 0335  . nicotine polacrilex (COMMIT) lozenge 2 mg  2 mg Oral PRN Zada Finders R, MD      . ondansetron (ZOFRAN) tablet 4 mg  4 mg Oral Q6H PRN Lenore Cordia, MD       Or  . ondansetron (ZOFRAN) injection 4 mg  4 mg Intravenous Q6H PRN Zada Finders R, MD      . oxyCODONE (Oxy IR/ROXICODONE) immediate release tablet 5 mg  5 mg Oral Q4H PRN Sreenath, Sudheer B, MD      . senna-docusate (Senokot-S) tablet 1 tablet  1 tablet  Oral QHS PRN Lenore Cordia, MD         Discharge Medications: Please see discharge summary for a list of discharge medications.  Relevant Imaging Results:  Relevant Lab Results:   Additional Information SS# [id number]  Shelbie Ammons, RN

## 2020-07-11 ENCOUNTER — Observation Stay: Payer: Medicare HMO

## 2020-07-11 ENCOUNTER — Inpatient Hospital Stay: Payer: Medicare HMO

## 2020-07-11 ENCOUNTER — Ambulatory Visit: Payer: No Typology Code available for payment source | Admitting: Radiation Oncology

## 2020-07-11 ENCOUNTER — Encounter: Payer: Self-pay | Admitting: Internal Medicine

## 2020-07-11 DIAGNOSIS — C7951 Secondary malignant neoplasm of bone: Secondary | ICD-10-CM | POA: Diagnosis present

## 2020-07-11 DIAGNOSIS — M4316 Spondylolisthesis, lumbar region: Secondary | ICD-10-CM | POA: Diagnosis present

## 2020-07-11 DIAGNOSIS — M545 Low back pain, unspecified: Secondary | ICD-10-CM | POA: Diagnosis not present

## 2020-07-11 DIAGNOSIS — C3490 Malignant neoplasm of unspecified part of unspecified bronchus or lung: Secondary | ICD-10-CM | POA: Diagnosis not present

## 2020-07-11 DIAGNOSIS — C3412 Malignant neoplasm of upper lobe, left bronchus or lung: Secondary | ICD-10-CM | POA: Diagnosis not present

## 2020-07-11 DIAGNOSIS — R296 Repeated falls: Secondary | ICD-10-CM | POA: Diagnosis present

## 2020-07-11 DIAGNOSIS — Z882 Allergy status to sulfonamides status: Secondary | ICD-10-CM | POA: Diagnosis not present

## 2020-07-11 DIAGNOSIS — C3492 Malignant neoplasm of unspecified part of left bronchus or lung: Secondary | ICD-10-CM | POA: Diagnosis not present

## 2020-07-11 DIAGNOSIS — K2950 Unspecified chronic gastritis without bleeding: Secondary | ICD-10-CM | POA: Diagnosis present

## 2020-07-11 DIAGNOSIS — C3431 Malignant neoplasm of lower lobe, right bronchus or lung: Secondary | ICD-10-CM | POA: Diagnosis not present

## 2020-07-11 DIAGNOSIS — E739 Lactose intolerance, unspecified: Secondary | ICD-10-CM | POA: Diagnosis present

## 2020-07-11 DIAGNOSIS — F1011 Alcohol abuse, in remission: Secondary | ICD-10-CM | POA: Diagnosis present

## 2020-07-11 DIAGNOSIS — M1612 Unilateral primary osteoarthritis, left hip: Secondary | ICD-10-CM | POA: Diagnosis present

## 2020-07-11 DIAGNOSIS — J439 Emphysema, unspecified: Secondary | ICD-10-CM | POA: Diagnosis present

## 2020-07-11 DIAGNOSIS — B1920 Unspecified viral hepatitis C without hepatic coma: Secondary | ICD-10-CM | POA: Diagnosis present

## 2020-07-11 DIAGNOSIS — R531 Weakness: Secondary | ICD-10-CM | POA: Diagnosis not present

## 2020-07-11 DIAGNOSIS — Z515 Encounter for palliative care: Secondary | ICD-10-CM | POA: Diagnosis not present

## 2020-07-11 DIAGNOSIS — F17210 Nicotine dependence, cigarettes, uncomplicated: Secondary | ICD-10-CM | POA: Diagnosis present

## 2020-07-11 DIAGNOSIS — I2510 Atherosclerotic heart disease of native coronary artery without angina pectoris: Secondary | ICD-10-CM | POA: Diagnosis not present

## 2020-07-11 DIAGNOSIS — G893 Neoplasm related pain (acute) (chronic): Secondary | ICD-10-CM | POA: Diagnosis present

## 2020-07-11 DIAGNOSIS — M79605 Pain in left leg: Secondary | ICD-10-CM | POA: Diagnosis not present

## 2020-07-11 DIAGNOSIS — J432 Centrilobular emphysema: Secondary | ICD-10-CM | POA: Diagnosis not present

## 2020-07-11 DIAGNOSIS — E876 Hypokalemia: Secondary | ICD-10-CM | POA: Diagnosis present

## 2020-07-11 DIAGNOSIS — R0902 Hypoxemia: Secondary | ICD-10-CM | POA: Diagnosis present

## 2020-07-11 DIAGNOSIS — D649 Anemia, unspecified: Secondary | ICD-10-CM | POA: Diagnosis not present

## 2020-07-11 DIAGNOSIS — Z681 Body mass index (BMI) 19 or less, adult: Secondary | ICD-10-CM | POA: Diagnosis not present

## 2020-07-11 DIAGNOSIS — M25552 Pain in left hip: Secondary | ICD-10-CM | POA: Diagnosis not present

## 2020-07-11 DIAGNOSIS — C3432 Malignant neoplasm of lower lobe, left bronchus or lung: Secondary | ICD-10-CM | POA: Diagnosis present

## 2020-07-11 DIAGNOSIS — Z885 Allergy status to narcotic agent status: Secondary | ICD-10-CM | POA: Diagnosis not present

## 2020-07-11 DIAGNOSIS — Z8249 Family history of ischemic heart disease and other diseases of the circulatory system: Secondary | ICD-10-CM | POA: Diagnosis not present

## 2020-07-11 DIAGNOSIS — Z20822 Contact with and (suspected) exposure to covid-19: Secondary | ICD-10-CM | POA: Diagnosis present

## 2020-07-11 DIAGNOSIS — D638 Anemia in other chronic diseases classified elsewhere: Secondary | ICD-10-CM | POA: Diagnosis present

## 2020-07-11 DIAGNOSIS — I700 Atherosclerosis of aorta: Secondary | ICD-10-CM | POA: Diagnosis present

## 2020-07-11 DIAGNOSIS — R918 Other nonspecific abnormal finding of lung field: Secondary | ICD-10-CM | POA: Diagnosis not present

## 2020-07-11 DIAGNOSIS — E43 Unspecified severe protein-calorie malnutrition: Secondary | ICD-10-CM | POA: Diagnosis present

## 2020-07-11 DIAGNOSIS — R64 Cachexia: Secondary | ICD-10-CM | POA: Diagnosis present

## 2020-07-11 MED ORDER — NICOTINE POLACRILEX 2 MG MT GUM
2.0000 mg | CHEWING_GUM | OROMUCOSAL | Status: DC | PRN
  Administered 2020-07-11 – 2020-07-12 (×2): 2 mg via ORAL
  Filled 2020-07-11 (×3): qty 1

## 2020-07-11 MED ORDER — GADOBUTROL 1 MMOL/ML IV SOLN
5.0000 mL | Freq: Once | INTRAVENOUS | Status: AC | PRN
  Administered 2020-07-11: 5 mL via INTRAVENOUS

## 2020-07-11 MED ORDER — IOHEXOL 300 MG/ML  SOLN
75.0000 mL | Freq: Once | INTRAMUSCULAR | Status: AC | PRN
  Administered 2020-07-11: 75 mL via INTRAVENOUS

## 2020-07-11 NOTE — Progress Notes (Signed)
Physical Therapy Treatment Patient Details Name: Kevin Baker MRN: [id number] DOB: 19-Jul-1955 Today's Date: 07/11/2020    History of Present Illness 65 y.o. 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    PT Comments    Pt pleasant and ostensibly motivated, but very limited with how much he is actually able to do/tolerate.  Pt did not tolerate standing > than a few minutes and could not extend hip/knee or tolerate real WBing through L.  He did try to do some small side shuffling step but each time collapsed onto UE/walker and could not effectively move either foot at EOB.  Pt hyper reactive to any palpation of the L thigh and despite good effort with exercises lacked functional range and strength with effectively all L hip and knee motion.    Follow Up Recommendations  SNF;Supervision/Assistance - 24 hour     Equipment Recommendations   (TBD at rehab)    Recommendations for Other Services       Precautions / Restrictions Precautions Precautions: Fall Restrictions Weight Bearing Restrictions: No    Mobility  Bed Mobility Overal bed mobility: Needs Assistance Bed Mobility: Supine to Sit     Supine to sit: Min assist     General bed mobility comments: Pt again extremely guarded with any mobility, holding L hip into >90 flexion and very hesitant to move more than an inch or 2 at a time.  He was eager to try but very guarded and slow to get to sitting    Transfers Overall transfer level: Needs assistance Equipment used: Rolling walker (2 wheeled) Transfers: Sit to/from Stand Sit to Stand: Min assist;From elevated surface         General  transfer comment: Pt does not feel good about getting to recliner, but did want to try standing and showed good effort.  Per typical he was very slow and hesitant to get to standing but with heavy UE use and plenty of cuing he was able to do so.  Pt lacked L hip to neutral and effectively was TTWBing only on the L, attempt at side stepping with heavy UE use was unsafe and ineffective.  Ultimately needing to sit after only <3 minutes in standing.  Ambulation/Gait             General Gait Details: deferred this date   Stairs             Wheelchair Mobility    Modified Rankin (Stroke Patients Only)       Balance Overall balance assessment: Needs assistance Sitting-balance support: Bilateral upper extremity supported Sitting balance-Leahy Scale: Fair Sitting balance - Comments: Pt forward flexed and holding L thigh with UEs much of the time, no LOBs but guarded and generally usnteady.     Standing balance-Leahy Scale: Poor Standing balance comment: poor tolerance, unable to stand fully upright, reliant on walker and unable to effectively WB through L LE.                            Cognition Arousal/Alertness: Awake/alert Behavior During Therapy: Restless Overall Cognitive Status: Within Functional Limits for tasks assessed  Exercises General Exercises - Lower Extremity Ankle Circles/Pumps: Strengthening;10 reps Quad Sets:  (pt unable to get to position to attempt this) Short Arc Quad: AAROM;5 reps (attempted, unable to engage quad get any AROM going on L, very guarded with PT attempts to [facility] assist) Heel Slides: AROM;5 reps (pt showed good effort but only has limited range and unable to tolerate any real resistance) Hip ABduction/ADduction: Strengthening;10 reps (in hooklying position, able to tolerate resisted AB/AD on L - essentially the only plane that he tolerated well) Straight Leg Raises: AAROM;5  reps (PT/pt UE to lift against gravity, unable to extend knee past ~30 deg from neutral)    General Comments        Pertinent Vitals/Pain Pain Assessment: 0-10 Pain Score: 3  Pain Location: L side of body (primarily LE)    Home Living                      Prior Function            PT Goals (current goals can now be found in the care plan section) Progress towards PT goals: Progressing toward goals    Frequency    Min 2X/week      PT Plan Current plan remains appropriate    Co-evaluation              AM-PAC PT "6 Clicks" Mobility   Outcome Measure  Help needed turning from your back to your side while in a flat bed without using bedrails?: A Little Help needed moving from lying on your back to sitting on the side of a flat bed without using bedrails?: A Little Help needed moving to and from a bed to a chair (including a wheelchair)?: A Lot Help needed standing up from a chair using your arms (e.g., wheelchair or bedside chair)?: A Lot Help needed to walk in hospital room?: Total Help needed climbing 3-5 steps with a railing? : Total 6 Click Score: 12    End of Session Equipment Utilized During Treatment: Gait belt Activity Tolerance: Patient limited by pain Patient left: with call bell/phone within reach;with bed alarm set   PT Visit Diagnosis: Muscle weakness (generalized) (M62.81);Difficulty in walking, not elsewhere classified (R26.2);Pain Pain - Right/Left: Left Pain - part of body: Hip     Time: 5053-9767 PT Time Calculation (min) (ACUTE ONLY): 27 min  Charges:  $Therapeutic Exercise: 8-22 mins $Therapeutic Activity: 8-22 mins                     Kreg Shropshire, DPT 07/11/2020, 12:47 PM

## 2020-07-11 NOTE — Progress Notes (Signed)
Pulmonary Medicine          Date: 07/11/2020,   MRN# [id number] Kevin Baker [birth year]     AdmissionWeight: 68 kg                 CurrentWeight: 58.9 kg      CHIEF COMPLAINT:   Lung mass of LLL 4.3cm    HISTORY OF PRESENT ILLNESS   Kevin Baker is a 65 y.o. 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  Remote posttraumatic changes of the left pubic root, superior and inferior rami and left ilium are unchanged from prior CT.  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  Fluid-filled appearance of the colon also seen.  Patient was given 1 L normal saline and the hospitalist service was consulted to admit for further evaluation and management.  Patient is not using any oxygen at this time.  He stopped smoking last 2 weeks he stopped drinking alcohol 2 monts ago. Kevin Baker  He is not coughing.  Pulmonary consultation for tissue diagnosis of LLL mass.  Reviweed with patient he isa greeable to  Biopsy.    07/11/20-  Patient for bronchoscopy and biopsy in am.                      NPO POST MIDNIGHT TODAY PLEASE   PAST MEDICAL HISTORY   Past Medical History:  Diagnosis Date  . Alcohol use disorder, severe, dependence ([place])   . Mass of lower lobe of left lung      SURGICAL HISTORY   Past Surgical History:  Procedure Laterality Date  . BACK SURGERY    . LEG SURGERY Right    . PELVIC FRACTURE SURGERY       FAMILY HISTORY   Family History  Problem Relation Age of Onset  . Heart disease Father      SOCIAL HISTORY   Social History   Tobacco Use  . Smoking status: Current Every Day Smoker    Packs/day: 3.00    Types: Cigarettes  . Smokeless tobacco: Never Used  Substance Use Topics  . Alcohol use: Not Currently    Comment: for last 3 weeks, drinking daily   . Drug use: Not Currently     MEDICATIONS    Home Medication:    Current Medication:  Current Facility-Administered Medications:  .  acetaminophen (TYLENOL) tablet 1,000 mg, 1,000 mg, Oral, TID, Sreenath, Sudheer B, MD, 1,000 mg at 07/11/20 1006 .  baclofen (LIORESAL) tablet 10 mg, 10 mg, Oral, TID PRN, Zada Finders R, MD .  buPROPion [facility] SR) 12 hr tablet 150 mg, 150 mg, Oral, Daily, Zada Finders R, MD, 150 mg at 07/11/20 1007 .  dexamethasone (DECADRON) tablet 2 mg, 2 mg, Oral, Q12H, Borders, Vonna Kotyk R, NP, 2 mg at 07/11/20 1007 .  diclofenac Sodium (VOLTAREN) 1 % topical gel 2-4 g, 2-4 g, Topical, QID, Posey Pronto, Cleaster Corin, MD,  4 g at 07/11/20 1007 .  feeding supplement (ENSURE ENLIVE / ENSURE PLUS) liquid 237 mL, 237 mL, Oral, TID BM, Sreenath, Sudheer B, MD, 237 mL at 07/11/20 1007 .  gabapentin (NEURONTIN) capsule 400 mg, 400 mg, Oral, TID, Sreenath, Sudheer B, MD, 400 mg at 07/11/20 1006 .  hydrOXYzine (VISTARIL) capsule 25 mg, 25 mg, Oral, QID PRN, Zada Finders R, MD .  morphine 2 MG/ML injection 2 mg, 2 mg, Intravenous, Q3H PRN, Priscella Mann, Sudheer B, MD, 2 mg at 07/11/20 0544 .  multivitamin with minerals tablet 1 tablet, 1 tablet, Oral, Daily, Priscella Mann, Sudheer B, MD, 1 tablet at 07/11/20 1006 .  nicotine (NICODERM CQ - dosed in mg/24 hours) patch 14 mg, 14 mg, Transdermal, Daily, Zada Finders R, MD, 14 mg at 07/11/20 1006 .  nicotine polacrilex (NICORETTE) gum 2 mg, 2 mg, Oral, PRN, Priscella Mann, Sudheer B, MD .  ondansetron (ZOFRAN) tablet 4 mg, 4 mg, Oral, Q6H PRN, 4 mg at  07/10/20 2114 **OR** ondansetron (ZOFRAN) injection 4 mg, 4 mg, Intravenous, Q6H PRN, Zada Finders R, MD .  oxyCODONE (Oxy IR/ROXICODONE) immediate release tablet 5 mg, 5 mg, Oral, Q4H PRN, Priscella Mann, Sudheer B, MD, 5 mg at 07/10/20 2114 .  pantoprazole (PROTONIX) EC tablet 40 mg, 40 mg, Oral, Daily, Sharion Settler, NP, 40 mg at 07/11/20 1006 .  senna-docusate (Senokot-S) tablet 1 tablet, 1 tablet, Oral, QHS PRN, Lenore Cordia, MD    ALLERGIES   Codeine, Sulfamethoxazole-trimethoprim, and Lactose intolerance (gi)     REVIEW OF SYSTEMS    Review of Systems:  Gen:  Denies  fever, sweats, chills weigh loss  HEENT: Denies blurred vision, double vision, ear pain, eye pain, hearing loss, nose bleeds, sore throat Cardiac:  No dizziness, chest pain or heaviness, chest tightness,edema Resp:   Denies cough or sputum porduction, shortness of breath,wheezing, hemoptysis,  Gi: Denies swallowing difficulty, stomach pain, nausea or vomiting, diarrhea, constipation, bowel incontinence Gu:  Denies bladder incontinence, burning urine Ext:   Denies Joint pain, stiffness or swelling Skin: Denies  skin rash, easy bruising or bleeding or hives Endoc:  Denies polyuria, polydipsia , polyphagia or weight change Psych:   Denies depression, insomnia or hallucinations   Other:  All other systems negative   VS: BP 94/62 (BP Location: Left Arm)   Pulse 78   Temp 98.6 F (37 C)   Resp 16   Ht 6' (1.829 m)   Wt 58.9 kg   SpO2 99%   BMI 17.61 kg/m      PHYSICAL EXAM    GENERAL:NAD, no fevers, chills, no weakness no fatigue HEAD: Normocephalic, atraumatic.  EYES: Pupils equal, round, reactive to light. Extraocular muscles intact. No scleral icterus.  MOUTH: Moist mucosal membrane. Dentition intact. No abscess noted.  EAR, NOSE, THROAT: Clear without exudates. No external lesions.  NECK: Supple. No thyromegaly. No nodules. No JVD.  PULMONARY: decreased air entry at LLL, with rhonchi  bialterally  CARDIOVASCULAR: S1 and S2. Regular rate and rhythm. No murmurs, rubs, or gallops. No edema. Pedal pulses 2+ bilaterally.  GASTROINTESTINAL: Soft, nontender, nondistended. No masses. Positive bowel sounds. No hepatosplenomegaly.  MUSCULOSKELETAL: No swelling, clubbing, or edema. Range of motion full in all extremities.  NEUROLOGIC: Cranial nerves II through XII are intact. No gross focal neurological deficits. Sensation intact. Reflexes intact.  SKIN: No ulceration, lesions, rashes, or cyanosis. Skin warm and dry. Turgor intact.  PSYCHIATRIC: Mood, affect within normal limits. The patient is awake, alert and oriented x 3. Insight,  judgment intact.       IMAGING    MR Lumbar Spine W Wo Contrast  Result Date: 07/11/2020 CLINICAL DATA:  65 year old male with low back and left lower extremity pain progressed x3 months. Spiculated left lower lobe lung mass in December. EXAM: MRI LUMBAR SPINE WITHOUT AND WITH CONTRAST TECHNIQUE: Multiplanar and multiecho pulse sequences of the lumbar spine were obtained without and with intravenous contrast. CONTRAST:  62m GADAVIST GADOBUTROL 1 MMOL/ML IV SOLN COMPARISON:  CT Chest, Abdomen, and Pelvis 05/14/2020. FINDINGS: Segmentation:  Normal on the December CT. Alignment: Grade 2 spondylolisthesis of L4 on L5, and grade 1 anterolisthesis of L5 on S1 are stable since December. No associated pars fractures. Underlying mild dextroconvex lumbar scoliosis. Vertebrae: Abnormal decreased T1 signal, STIR hyperintensity and patchy enhancement in the left sacral ala partially visible on series 7, image 11. Elsewhere the visible sacrum and left SI joint appear intact. However, left SI joint ankylosis was demonstrated on the prior CT. Subtle semicircular area of increased STIR signal in the left posterolateral L2 vertebral body (series 7, image 9) demonstrates mild enhancement, and a subtle sclerotic lesion was present here in December. However, axial MRI appearance  today raises the possibility of degenerative Schmorl's node (series 8, image 15). The lesion is 16 mm diameter by 8 mm thick. Otherwise normal marrow signal in the visible lower thoracic and lumbar spine. Mild chronic T11 superior endplate compression is stable since December. Conus medullaris and cauda equina: Conus extends to the T12-L1 level. No lower spinal cord or conus signal abnormality. No abnormal intradural enhancement. No dural thickening. Cauda equina nerve roots appear normal. Paraspinal and other soft tissues: Partially visible distended urinary bladder. Trace free fluid in the pelvis. Otherwise negative visible abdominal viscera. Negative lumbar paraspinal soft tissues. Disc levels: Capacious lower thoracic and lumbar spinal canal above L4. L4-L5: Grade 2 spondylolisthesis with advanced disc and posterior element degeneration. Severe facet hypertrophy greater on the right with degenerative facet joint fluid. Moderate spinal stenosis and bilateral lateral recess stenosis. Moderate to severe bilateral L4 foraminal stenosis. L5-S1: Mild anterolisthesis with disc and posterior element degeneration but no spinal stenosis. There is moderate right L5 foraminal stenosis. IMPRESSION: 1. Partially visible abnormal signal in the left sacral ala with patchy enhancement. This does not seem mass-like, and more resembles a sacral insufficiency fracture than metastatic disease (but see also #2). 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 2. Small 5 x 16 mm enhancing lesion in the L2 posterosuperior vertebral body is indeterminate for small bone Metastasis versus Schmorl's node. Follow-up PET-CT might best characterize further. Failing MAC, short interval 2-3 month follow-up MRI would be most valuable. 3. No other metastatic disease identified in the visible lower thoracic or lumbar spine. 4. Degenerative grade 2 spondylolisthesis at L4-L5 with advanced disc and posterior element degeneration, moderate  spinal, lateral recess, and moderate to severe foraminal stenosis. Grade 1 spondylolisthesis at L5-S1 with moderate right foraminal stenosis. Electronically Signed   By: HGenevie AnnM.D.   On: 07/11/2020 08:10   CT Hip Left Wo Contrast  Result Date: 07/10/2020 CLINICAL DATA:  Left leg and arm weakness with pain since March 2021, negative radiographs EXAM: CT OF THE LEFT HIP WITHOUT CONTRAST TECHNIQUE: Multidetector CT imaging of the left hip was performed according to the standard protocol. Multiplanar CT image reconstructions were also generated. COMPARISON:  Radiograph 07/09/2020, CT 05/14/2020 FINDINGS: Bones/Joint/Cartilage The osseous structures appear diffusely demineralized which may limit detection of small or nondisplaced fractures. Ovoid  lucent lesion centered within the lesser trochanter measuring approximately 2.6 x 2.3 x 3.1 cm in size. No clear associated pathologic fracture is evident at this time. No other suspicious lytic or blastic lesions within the included margins of imaging. No other acute bony abnormality. Specifically, no fracture, subluxation, or dislocation of the left hip or included bony pelvis. More remote remote posttraumatic changes of the left pubic root, superior and inferior rami, unchanged from comparison CT. Ankylosis left SI joint and likely prior traumatic deformity of the left ilium. Degenerative change at the pubic symphysis and left hip is similar to comparison. No sizable hip effusion. Ligaments Suboptimally assessed by CT. Muscles and Tendons No acute musculotendinous abnormality is seen. Soft tissues Atherosclerotic calcifications in the iliac arteries and proximal common and superficial femoral arteries. Circumferential thickening of the urinary bladder though possibly related to underdistention. Fluid-filled appearance of the colon, correlate for rapid transit state. IMPRESSION: 1. Ovoid lucent lesion centered within the left lesser trochanter measuring approximately 2.6  x 2.3 x 3.1 cm in size. No clear associated pathologic fracture is evident at this time. Findings concerning for metastatic lesion given known left lung lesion. 2. Remote posttraumatic changes of the left pubic root, superior and inferior rami, and left ilium unchanged from comparison CT. 3. Ankylosis left SI joint. 4. Circumferential thickening of the urinary bladder though possibly related to underdistention. Correlate with urinalysis to exclude cystitis. 5. Fluid-filled appearance of the colon, could reflect diarrheal illness/rapid transit state. 6. Atherosclerosis These results were called by telephone at the time of interpretation on 07/10/2020 at 12:23 am to provider Dr. Tamala Julian, who verbally acknowledged these results. Electronically Signed   By: Lovena Le M.D.   On: 07/10/2020 00:23   DG Hip Unilat W or Wo Pelvis 2-3 Views Left  Result Date: 07/09/2020 CLINICAL DATA:  Acute on chronic hip pain EXAM: DG HIP (WITH OR WITHOUT PELVIS) 2-3V LEFT COMPARISON:  05/13/2020 FINDINGS: Chronic deformity of left iliac bone and pubic rami. No acute displaced fracture or malalignment is seen. There are mild degenerative changes of both hips. Vascular calcifications. IMPRESSION: No acute osseous abnormality. Chronic deformity of left iliac bone and pubic rami. Electronically Signed   By: Donavan Foil M.D.   On: 07/09/2020 21:25      ASSESSMENT/PLAN   4.6 x 3.8 cm spiculated mass is noted in the left lower lobe   - reviewed with patient he wishes to have biopsy and treatment of cancer   - we reviewed ENB/EBUS procedure   - patietn wishes to proceed with bronchosopic evaluation   - willhold lovenox and place on SCDs   -SUPER D CT CHEST - today 07/11/20   - NPO post midnight Thursday   - PROCEDURE 730 AM  for Friday 07/12/20 --Reviewed risks/complications and benefits with patient, risks include infection, pneumothorax/pneumomediastinum which may require chest tube placement as well as overnight/prolonged  hospitalization and possible mechanical ventilation. Other risks include bleeding and very rarely death.  Patient understands risks and wishes to proceed.  Additional questions were answered, and patient is aware that post procedure patient will be going home with family and may experience cough with possible clots on expectoration as well as phlegm which may last few days as well as hoarseness of voice post intubation and mechanical ventilation.    Thank you for allowing me to participate in the care of this patient.   Patient/Family are satisfied with care plan and all questions have been answered.  This document was prepared  using Systems analyst and may include unintentional dictation errors.     Ottie Glazier, M.D.  Division of [place]

## 2020-07-11 NOTE — Progress Notes (Signed)
Kevin Baker   DOB:Sep 14, 1955   YN#:[id number]    Subjective: Patient states his pain is not well controlled.  Denies any headaches but denies any nausea vomiting.  Positive for cough.  Objective:  Vitals:   07/11/20 0815 07/11/20 1151  BP: (!) 89/61 94/62  Pulse: 80 78  Resp: 16 16  Temp: 98.1 F (36.7 C) 98.6 F (37 C)  SpO2: 99% 99%     Intake/Output Summary (Last 24 hours) at 07/11/2020 1302 Last data filed at 07/11/2020 0433 Gross per 24 hour  Intake 1460.39 ml  Output 800 ml  Net 660.39 ml    Physical Exam HENT:     Head: Normocephalic and atraumatic.     Mouth/Throat:     Mouth: Oropharynx is clear and moist.     Pharynx: No oropharyngeal exudate.  Eyes:     Pupils: Pupils are equal, round, and reactive to light.  Cardiovascular:     Rate and Rhythm: Normal rate and regular rhythm.  Pulmonary:     Effort: No respiratory distress.     Breath sounds: No wheezing.     Comments: Decreased air entry bilaterally. Abdominal:     General: Bowel sounds are normal. There is no distension.     Palpations: Abdomen is soft. There is no mass.     Tenderness: There is no abdominal tenderness. There is no guarding or rebound.  Musculoskeletal:        General: No tenderness or edema. Normal range of motion.     Cervical back: Normal range of motion and neck supple.     Comments: Limited movement of the left hip.  Skin:    General: Skin is warm.  Neurological:     Mental Status: He is alert and oriented to person, place, and time.  Psychiatric:        Mood and Affect: Affect normal.      Labs:  Lab Results  Component Value Date   WBC 8.4 07/10/2020   HGB 10.4 (L) 07/10/2020   HCT 29.1 (L) 07/10/2020   MCV 93.9 07/10/2020   PLT 501 (H) 07/10/2020    Lab Results  Component Value Date   NA 133 (L) 07/10/2020   K 3.1 (L) 07/10/2020   CL 100 07/10/2020   CO2 24 07/10/2020    Studies:  MR Lumbar Spine W Wo Contrast  Result Date: 07/11/2020 CLINICAL DATA:   65 year old male with low back and left lower extremity pain progressed x3 months. Spiculated left lower lobe lung mass in December. EXAM: MRI LUMBAR SPINE WITHOUT AND WITH CONTRAST TECHNIQUE: Multiplanar and multiecho pulse sequences of the lumbar spine were obtained without and with intravenous contrast. CONTRAST:  21m GADAVIST GADOBUTROL 1 MMOL/ML IV SOLN COMPARISON:  CT Chest, Abdomen, and Pelvis 05/14/2020. FINDINGS: Segmentation:  Normal on the December CT. Alignment: Grade 2 spondylolisthesis of L4 on L5, and grade 1 anterolisthesis of L5 on S1 are stable since December. No associated pars fractures. Underlying mild dextroconvex lumbar scoliosis. Vertebrae: Abnormal decreased T1 signal, STIR hyperintensity and patchy enhancement in the left sacral ala partially visible on series 7, image 11. Elsewhere the visible sacrum and left SI joint appear intact. However, left SI joint ankylosis was demonstrated on the prior CT. Subtle semicircular area of increased STIR signal in the left posterolateral L2 vertebral body (series 7, image 9) demonstrates mild enhancement, and a subtle sclerotic lesion was present here in December. However, axial MRI appearance today raises the possibility of degenerative Schmorl's node (series  8, image 15). The lesion is 16 mm diameter by 8 mm thick. Otherwise normal marrow signal in the visible lower thoracic and lumbar spine. Mild chronic T11 superior endplate compression is stable since December. Conus medullaris and cauda equina: Conus extends to the T12-L1 level. No lower spinal cord or conus signal abnormality. No abnormal intradural enhancement. No dural thickening. Cauda equina nerve roots appear normal. Paraspinal and other soft tissues: Partially visible distended urinary bladder. Trace free fluid in the pelvis. Otherwise negative visible abdominal viscera. Negative lumbar paraspinal soft tissues. Disc levels: Capacious lower thoracic and lumbar spinal canal above L4. L4-L5:  Grade 2 spondylolisthesis with advanced disc and posterior element degeneration. Severe facet hypertrophy greater on the right with degenerative facet joint fluid. Moderate spinal stenosis and bilateral lateral recess stenosis. Moderate to severe bilateral L4 foraminal stenosis. L5-S1: Mild anterolisthesis with disc and posterior element degeneration but no spinal stenosis. There is moderate right L5 foraminal stenosis. IMPRESSION: 1. Partially visible abnormal signal in the left sacral ala with patchy enhancement. This does not seem mass-like, and more resembles a sacral insufficiency fracture than metastatic disease (but see also #2). 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 2. Small 5 x 16 mm enhancing lesion in the L2 posterosuperior vertebral body is indeterminate for small bone Metastasis versus Schmorl's node. Follow-up PET-CT might best characterize further. Failing MAC, short interval 2-3 month follow-up MRI would be most valuable. 3. No other metastatic disease identified in the visible lower thoracic or lumbar spine. 4. Degenerative grade 2 spondylolisthesis at L4-L5 with advanced disc and posterior element degeneration, moderate spinal, lateral recess, and moderate to severe foraminal stenosis. Grade 1 spondylolisthesis at L5-S1 with moderate right foraminal stenosis. Electronically Signed   By: Genevie Ann M.D.   On: 07/11/2020 08:10   CT Hip Left Wo Contrast  Result Date: 07/10/2020 CLINICAL DATA:  Left leg and arm weakness with pain since March 2021, negative radiographs EXAM: CT OF THE LEFT HIP WITHOUT CONTRAST TECHNIQUE: Multidetector CT imaging of the left hip was performed according to the standard protocol. Multiplanar CT image reconstructions were also generated. COMPARISON:  Radiograph 07/09/2020, CT 05/14/2020 FINDINGS: Bones/Joint/Cartilage The osseous structures appear diffusely demineralized which may limit detection of small or nondisplaced fractures. Ovoid lucent lesion  centered within the lesser trochanter measuring approximately 2.6 x 2.3 x 3.1 cm in size. No clear associated pathologic fracture is evident at this time. No other suspicious lytic or blastic lesions within the included margins of imaging. No other acute bony abnormality. Specifically, no fracture, subluxation, or dislocation of the left hip or included bony pelvis. More remote remote posttraumatic changes of the left pubic root, superior and inferior rami, unchanged from comparison CT. Ankylosis left SI joint and likely prior traumatic deformity of the left ilium. Degenerative change at the pubic symphysis and left hip is similar to comparison. No sizable hip effusion. Ligaments Suboptimally assessed by CT. Muscles and Tendons No acute musculotendinous abnormality is seen. Soft tissues Atherosclerotic calcifications in the iliac arteries and proximal common and superficial femoral arteries. Circumferential thickening of the urinary bladder though possibly related to underdistention. Fluid-filled appearance of the colon, correlate for rapid transit state. IMPRESSION: 1. Ovoid lucent lesion centered within the left lesser trochanter measuring approximately 2.6 x 2.3 x 3.1 cm in size. No clear associated pathologic fracture is evident at this time. Findings concerning for metastatic lesion given known left lung lesion. 2. Remote posttraumatic changes of the left pubic root, superior and inferior rami, and  left ilium unchanged from comparison CT. 3. Ankylosis left SI joint. 4. Circumferential thickening of the urinary bladder though possibly related to underdistention. Correlate with urinalysis to exclude cystitis. 5. Fluid-filled appearance of the colon, could reflect diarrheal illness/rapid transit state. 6. Atherosclerosis These results were called by telephone at the time of interpretation on 07/10/2020 at 12:23 am to provider Dr. Tamala Julian, who verbally acknowledged these results. Electronically Signed   By: Lovena Le M.D.   On: 07/10/2020 00:23   DG Hip Unilat W or Wo Pelvis 2-3 Views Left  Result Date: 07/09/2020 CLINICAL DATA:  Acute on chronic hip pain EXAM: DG HIP (WITH OR WITHOUT PELVIS) 2-3V LEFT COMPARISON:  05/13/2020 FINDINGS: Chronic deformity of left iliac bone and pubic rami. No acute displaced fracture or malalignment is seen. There are mild degenerative changes of both hips. Vascular calcifications. IMPRESSION: No acute osseous abnormality. Chronic deformity of left iliac bone and pubic rami. Electronically Signed   By: Donavan Foil M.D.   On: 07/09/2020 21:25    Mass of lower lobe of left lung #65 year old male patient history of smoking/alcohol is currently in the hospital for worsening left-sided hip pain; noted to have a large left lung mass.  # 4-5 cm left lung lower lobe mass-highly suspicious for malignancy-primary lung cancer; s/p evaluation with pulmonary.  Awaiting bronc/biopsy ON 2/25.   #Left hip pain-soft tissue lesions noted in the femoral area likely secondary to metastatic disease.  Appreciate orthopedic evaluation / recommendations.  Recommend radiation oncology evaluation for palliative radiation.  Appreciate palliative care recommendations.  #I spoke to patient's nephew Legrand Como updated of patient's status.  Discussed with Dr.Sreenath.     Cammie Sickle, MD 07/11/2020  1:02 PM

## 2020-07-11 NOTE — Anesthesia Preprocedure Evaluation (Addendum)
Anesthesia Evaluation  Patient identified by MRN, date of birth, ID band Patient awake    Reviewed: Allergy & Precautions, H&P , NPO status , Patient's Chart, lab work & pertinent test results, reviewed documented beta blocker date and time   Airway Mallampati: II  TM Distance: >3 FB Neck ROM: full    Dental  (+) Upper Dentures, Partial Lower   Pulmonary neg pulmonary ROS, Current Smoker,    Pulmonary exam normal        Cardiovascular Exercise Tolerance: Poor +CHF  Normal cardiovascular exam Rhythm:regular Rate:Normal     Neuro/Psych PSYCHIATRIC DISORDERS negative neurological ROS     GI/Hepatic negative GI ROS, Neg liver ROS,   Endo/Other  negative endocrine ROS  Renal/GU negative Renal ROS  negative genitourinary   Musculoskeletal   Abdominal   Peds  Hematology  (+) Blood dyscrasia, anemia ,   Anesthesia Other Findings Past Medical History: No date: Alcohol use disorder, severe, dependence (HCC) No date: Mass of lower lobe of left lung   Reproductive/Obstetrics negative OB ROS                           Anesthesia Physical Anesthesia Plan  ASA: III  Anesthesia Plan: General ETT   Post-op Pain Management:    Induction:   PONV Risk Score and Plan: 2  Airway Management Planned:   Additional Equipment:   Intra-op Plan:   Post-operative Plan:   Informed Consent: I have reviewed the patients History and Physical, chart, labs and discussed the procedure including the risks, benefits and alternatives for the proposed anesthesia with the patient or authorized representative who has indicated his/her understanding and acceptance.     Dental Advisory Given  Plan Discussed with: CRNA  Anesthesia Plan Comments:         Anesthesia Quick Evaluation

## 2020-07-11 NOTE — TOC Progression Note (Signed)
Transition of Care [facility]) - Progression Note    Patient Details  Name: Kevin Baker MRN: [id number] Date of Birth: Nov 19, 1955  Transition of Care [facility]) CM/SW [place], RN Phone Number: 07/11/2020, 2:38 PM  Clinical Narrative:   RNCM met with patient in room to present bed offers of [place] and [facility]. Offered patient Medicare.gov ratings however patient was already decided on [facility] as this is closer to his family.  RNCM accepted bed in hub and notified Tanya with [facility].  RNCM reached out to [employer] to determine if they manage patient but they do not. Facility will get insurance authorization.     Expected Discharge Plan: [place] Barriers to Discharge: Continued Medical Work up  Expected Discharge Plan and Services Expected Discharge Plan: [place] arrangements for the past 2 months: Single Family Home                                       Social Determinants of Health (SDOH) Interventions    Readmission Risk Interventions No flowsheet data found.

## 2020-07-11 NOTE — Consult Note (Signed)
NEW PATIENT EVALUATION  Name: Kevin Baker  MRN: [id number]  Date:   07/09/2020     DOB: 1955/09/09   This 65 y.o. male patient presents to the clinic for initial evaluation of palliative radiation therapy to his lesser trochanter of the left femur.  REFERRING PHYSICIAN: No ref. provider found  CHIEF COMPLAINT:  Chief Complaint  Patient presents with  . Weakness  . Leg Pain    DIAGNOSIS: The primary encounter diagnosis was Left hip pain. A diagnosis of Normocytic anemia was also pertinent to this visit.   PREVIOUS INVESTIGATIONS:  CT scan of chest abdomen pelvis and left hip reviewed Labs reviewed Clinical notes reviewed  HPI: Patient is a 65 year old male with significant history of smoking and EtOH use history admitted for increasing left-sided hip pain.  He was noted on admission to have a 4 to 5 cm left lower lobe mass highly suspicious for malignancy.  He is undergoing bronchoscopy tomorrow on the 25th.  He was noted on scans to have a lesion in the lesser trochanter of the left femoral region compatible with metastatic disease orthopedic consultation did not recommend pinning at this time since pathologic fracture was not eminent.  I was asked to see him in his hospital room for consideration of palliative radiation therapy to his left hip.  He specifically [place] cough hemoptysis or chest tightness at this time.  PLANNED TREATMENT REGIMEN: Palliative radiation therapy to left femur  PAST MEDICAL HISTORY:  has a past medical history of Alcohol use disorder, severe, dependence ([place]) and Mass of lower lobe of left lung.    PAST SURGICAL HISTORY:  Past Surgical History:  Procedure Laterality Date  . BACK SURGERY    . LEG SURGERY Right   . PELVIC FRACTURE SURGERY      FAMILY HISTORY: family history includes Heart disease in his father.  SOCIAL HISTORY:  reports that he has been smoking cigarettes. He has been smoking about 3.00 packs per day. He has never used smokeless  tobacco. He reports previous alcohol use. He reports previous drug use.  ALLERGIES: Codeine, Sulfamethoxazole-trimethoprim, and Lactose intolerance (gi)  MEDICATIONS:  Current Facility-Administered Medications  Medication Dose Route Frequency Provider Last Rate Last Admin  . acetaminophen (TYLENOL) tablet 1,000 mg  1,000 mg Oral TID Ralene Muskrat B, MD   1,000 mg at 07/11/20 1006  . baclofen (LIORESAL) tablet 10 mg  10 mg Oral TID PRN Lenore Cordia, MD      . buPROPion [facility] SR) 12 hr tablet 150 mg  150 mg Oral Daily Zada Finders R, MD   150 mg at 07/11/20 1007  . dexamethasone (DECADRON) tablet 2 mg  2 mg Oral Q12H Borders, Kirt Boys, NP   2 mg at 07/11/20 1007  . diclofenac Sodium (VOLTAREN) 1 % topical gel 2-4 g  2-4 g Topical QID Lenore Cordia, MD   4 g at 07/11/20 1007  . feeding supplement (ENSURE ENLIVE / ENSURE PLUS) liquid 237 mL  237 mL Oral TID BM Sreenath, Sudheer B, MD   237 mL at 07/11/20 1007  . gabapentin (NEURONTIN) capsule 400 mg  400 mg Oral TID Ralene Muskrat B, MD   400 mg at 07/11/20 1006  . hydrOXYzine (VISTARIL) capsule 25 mg  25 mg Oral QID PRN Zada Finders R, MD      . morphine 2 MG/ML injection 2 mg  2 mg Intravenous Q3H PRN Ralene Muskrat B, MD   2 mg at 07/11/20 0544  . multivitamin  with minerals tablet 1 tablet  1 tablet Oral Daily Ralene Muskrat B, MD   1 tablet at 07/11/20 1006  . nicotine (NICODERM CQ - dosed in mg/24 hours) patch 14 mg  14 mg Transdermal Daily Lenore Cordia, MD   14 mg at 07/11/20 1006  . nicotine polacrilex (NICORETTE) gum 2 mg  2 mg Oral PRN Ralene Muskrat B, MD      . ondansetron (ZOFRAN) tablet 4 mg  4 mg Oral Q6H PRN Lenore Cordia, MD   4 mg at 07/10/20 2114   Or  . ondansetron (ZOFRAN) injection 4 mg  4 mg Intravenous Q6H PRN Zada Finders R, MD      . oxyCODONE (Oxy IR/ROXICODONE) immediate release tablet 5 mg  5 mg Oral Q4H PRN Ralene Muskrat B, MD   5 mg at 07/10/20 2114  . pantoprazole (PROTONIX) EC  tablet 40 mg  40 mg Oral Daily Sharion Settler, NP   40 mg at 07/11/20 1006  . senna-docusate (Senokot-S) tablet 1 tablet  1 tablet Oral QHS PRN Lenore Cordia, MD        ECOG PERFORMANCE STATUS:  2 - Symptomatic, <50% confined to bed  REVIEW OF SYSTEMS: Patient denies any weight loss, fatigue, weakness, fever, chills or night sweats. Patient denies any loss of vision, blurred vision. Patient denies any ringing  of the ears or hearing loss. No irregular heartbeat. Patient denies heart murmur or history of fainting. Patient denies any chest pain or pain radiating to her upper extremities. Patient denies any shortness of breath, difficulty breathing at night, cough or hemoptysis. Patient denies any swelling in the lower legs. Patient denies any nausea vomiting, vomiting of blood, or coffee ground material in the vomitus. Patient denies any stomach pain. Patient states has had normal bowel movements no significant constipation or diarrhea. Patient denies any dysuria, hematuria or significant nocturia. Patient denies any problems walking, swelling in the joints or loss of balance. Patient denies any skin changes, loss of hair or loss of weight. Patient denies any excessive worrying or anxiety or significant depression. Patient denies any problems with insomnia. Patient denies excessive thirst, polyuria, polydipsia. Patient denies any swollen glands, patient denies easy bruising or easy bleeding. Patient denies any recent infections, allergies or URI. Patient "s visual fields have not changed significantly in recent time.   PHYSICAL EXAM: BP 94/62 (BP Location: Left Arm)   Pulse 78   Temp 98.6 F (37 C)   Resp 16   Ht 6' (1.829 m)   Wt 129 lb 13.6 oz (58.9 kg)   SpO2 99%   BMI 17.61 kg/m  There is exquisite pain on rotation of his left lower extremity.  Motor and sensory levels appear equal symmetric in the lower extremities.  Well-developed well-nourished patient in NAD. HEENT reveals PERLA, EOMI,  discs not visualized.  Oral cavity is clear. No oral mucosal lesions are identified. Neck is clear without evidence of cervical or supraclavicular adenopathy. Lungs are clear to A&P. Cardiac examination is essentially unremarkable with regular rate and rhythm without murmur rub or thrill. Abdomen is benign with no organomegaly or masses noted. Motor sensory and DTR levels are equal and symmetric in the upper and lower extremities. Cranial nerves II through XII are grossly intact. Proprioception is intact. No peripheral adenopathy or edema is identified. No motor or sensory levels are noted. Crude visual fields are within normal range.  LABORATORY DATA: Pathology reports of his bronchoscopy will reviewed when available.  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  We will plan on delivering 30 Gray in 10 fractions.  I personally 7 ordered CT simulation for first thing Monday.  We will start his treatments in the middle of next week.  Risks and benefits of treatment [place] skin reaction fatigue all were discussed in detail with the patient he comprehends her treatment plan well.  Appointment card was given the patient.  I would like to take this opportunity to thank you for allowing me to participate in the care of your patient.Noreene Filbert, MD

## 2020-07-11 NOTE — Progress Notes (Signed)
PROGRESS NOTE    Kevin Baker  RCB:[id number] DOB: [birth year] DOA: 07/09/2020 PCP: Patient, No Pcp Per   Brief Narrative:  Patient with history of alcohol use disorder, tobacco use, hepatitis C, chronic gastritis, chronic pain who presents for evaluation of worsening left hip pain and difficulty ambulating.  He was previously seen in the hospital in December 2021 for similar symptoms.  At that time the primary bronchogenic carcinoma was made aware to him and he was recommended to stay for further evaluation and management however during that admission he elected to leave [place].  He was seen by outpatient orthopedics and was given a prescription for Neurontin for left hip pain and a referral to neurology.  Patient is not followed up.  Presented to ED with progressive worsening hip pain.  I have communicated with oncology who agreed to consult on the patient.  Recommendations appreciated.  Per oncology requesting orthopedic evaluation for surgical fixation of a osseous lesion in the left femur.  Palliative care also involved.  Orthopedics consulted, not recommending surgical fixation at this time.  Communicated with medical oncology and pulmonology.  We will plan for bronchoscopy/EBUS for definitive diagnosis on 2/25.  Radiation oncology contacted for palliative radiation.   Assessment & Plan:   Principal Problem:   Acute on chronic pain of left hip Active Problems:   Mass of lower lobe of left lung   Hypokalemia   Hypomagnesemia   Normocytic anemia   Palliative care encounter   Protein-calorie malnutrition, severe   Cancer associated pain  Acute on chronic left hip pain with generalized weakness and frequent falls Degenerative joint disease of the left hip: CT imaging of the left hip concerning for metastatic lesion at the left lesser trochanter.  Also shows remote posttraumatic changes of the left pubic root, superior and inferior rami, left ilium and ankylosis at the left  SI joint. -Oncology consult requested -Orthopedic consult requested -Radiation oncology requested Plan: Multimodal pain control.  Therapy evaluations as able.  Plan for palliative radiation next week.  Radiation oncology involved.    Left lower lobe lung mass: 4.6 x 3.8 spiculated mass seen at the left lower lobe on CT imaging 05/14/2020 concerning for primary bronchogenic carcinoma.  Has not had outpatient follow-up for this yet.  Discussed findings with patient. -Pulmonary planning transbronchial biopsy on 2/25.  Communicated with Dr. Lanney Gins  Alcohol use disorder: Reports he quit drinking about 2 months ago.  Not currently exhibiting any signs of withdrawal.  Continue CIWA checks without Ativan for now.  Normocytic anemia: Without obvious bleeding.   No indication for transfusion Iron indices indicate anemia of chronic disease  Hypokalemia/hypomagnesemia: Monitor replace as necessary  Tobacco use: Reports cutting back to 1.5 PPD per week.  Patient strongly advised to quit smoking completely given likely bronchogenic cancer.  Nicotine patch and lozenges ordered.   DVT prophylaxis: SQ Lovenox Code Status: Full Family Communication: None today Disposition Plan: Status is: Inpatient  Remains inpatient appropriate because:Inpatient level of care appropriate due to severity of illness   Dispo: The patient is from: Home              Anticipated d/c is to: Home              Patient currently is not medically stable to d/c.   Difficult to place patient No   Transbronchial biopsy tomorrow.  Disposition plan pending improvement in pain control.          Level of care: Med-Surg  Consultants:   Oncology  Orthopedics  Palliative care  Pulmonology  Procedures:   None  Antimicrobials:   None   Subjective: Patient seen and examined.  Pain control appears to be improving.  No other complaints  Objective: Vitals:   07/10/20 2359 07/11/20 0335 07/11/20  0815 07/11/20 1151  BP: (!) 101/58 110/62 (!) 89/61 94/62  Pulse: 78 74 80 78  Resp: _0 Temp: 97.8 F (36.6 C) (!) 97.5 F (36.4 C) 98.1 F (36.7 C) 98.6 F (37 C)  TempSrc: Oral Oral    SpO2: 100% 100% 99% 99%  Weight:      Height:        Intake/Output Summary (Last 24 hours) at 07/11/2020 1512 Last data filed at 07/11/2020 1358 Gross per 24 hour  Intake 1460.39 ml  Output 800 ml  Net 660.39 ml   Filed Weights   07/09/20 1433 07/10/20 1535  Weight: 68 kg 58.9 kg    Examination:  General exam: Appears calm and comfortable  Respiratory system: Decreased air entry.  Scattered coarse crackles bilaterally.  Normal work of breathing.  Room air Cardiovascular system: S1 & S2 heard, RRR. No JVD, murmurs, rubs, gallops or clicks. No pedal edema. Gastrointestinal system: Abdomen is nondistended, soft and nontender. No organomegaly or masses felt. Normal bowel sounds heard. Central nervous system: Alert and oriented. No focal neurological deficits. Extremities: Markedly decreased power left lower extremity Skin: No rashes, lesions or ulcers Psychiatry: Judgement and insight appear normal. Mood & affect appropriate.     Data Reviewed: I have personally reviewed following labs and imaging studies  CBC: Recent Labs  Lab 07/09/20 1513 07/10/20 0608  WBC 8.6 8.4  HGB 9.9* 10.4*  HCT 28.7* 29.1*  MCV 97.0 93.9  PLT 509* 725*   Basic Metabolic Panel: Recent Labs  Lab 07/09/20 1513 07/09/20 2313 07/10/20 0608  NA 133*  --  133*  K 3.0*  --  3.1*  CL 98  --  100  CO2 25  --  24  GLUCOSE 103*  --  90  BUN 26*  --  19  CREATININE 1.03  --  0.88  CALCIUM 9.4  --  9.1  MG  --  1.1* 1.8  PHOS  --   --  2.0*   GFR: Estimated Creatinine Clearance: 69.7 mL/min (by C-G formula based on SCr of 0.88 mg/dL). Liver Function Tests: Recent Labs  Lab 07/09/20 2120  AST 19  ALT 16  ALKPHOS 66  BILITOT 0.5  PROT 7.2  ALBUMIN 3.4*   Recent Labs  Lab  07/09/20 2120  LIPASE 39   No results for input(s): AMMONIA in the last 168 hours. Coagulation Profile: No results for input(s): INR, PROTIME in the last 168 hours. Cardiac Enzymes: No results for input(s): CKTOTAL, CKMB, CKMBINDEX, TROPONINI in the last 168 hours. BNP (last 3 results) No results for input(s): PROBNP in the last 8760 hours. HbA1C: No results for input(s): HGBA1C in the last 72 hours. CBG: No results for input(s): GLUCAP in the last 168 hours. Lipid Profile: No results for input(s): CHOL, HDL, LDLCALC, TRIG, CHOLHDL, LDLDIRECT in the last 72 hours. Thyroid Function Tests: No results for input(s): TSH, T4TOTAL, FREET4, T3FREE, THYROIDAB in the last 72 hours. Anemia Panel: Recent Labs    07/10/20 0608  VITAMINB12 327  FOLATE 18.4  FERRITIN 384*  TIBC 216*  IRON 42*   Sepsis Labs: No results for input(s): PROCALCITON, LATICACIDVEN in the last 168 hours.  Recent Results (from the past 240 hour(s))  Resp Panel by RT-PCR (Flu A&B, Covid) Nasopharyngeal Swab     Status: None   Collection Time: 07/09/20 11:13 PM   Specimen: Nasopharyngeal Swab; Nasopharyngeal(NP) swabs in vial transport medium  Result Value Ref Range Status   SARS Coronavirus 2 by RT PCR NEGATIVE NEGATIVE Final    Comment: (NOTE) SARS-CoV-2 target nucleic acids are NOT DETECTED.  The SARS-CoV-2 RNA is generally detectable in upper respiratory specimens during the acute phase of infection. The lowest concentration of SARS-CoV-2 viral copies this assay can detect is 138 copies/mL. A negative result does not preclude SARS-Cov-2 infection and should not be used as the sole basis for treatment or other patient management decisions. A negative result may occur with  improper specimen collection/handling, submission of specimen other than nasopharyngeal swab, presence of viral mutation(s) within the areas targeted by this assay, and inadequate number of viral copies(<138 copies/mL). A negative  result must be combined with clinical observations, patient history, and epidemiological information. The expected result is Negative.  Fact Sheet for Patients:  EntrepreneurPulse.com.au  Fact Sheet for Healthcare Providers:  IncredibleEmployment.be  This test is no t yet approved or cleared by the [place] FDA and  has been authorized for detection and/or diagnosis of SARS-CoV-2 by FDA under an Emergency Use Authorization (EUA). This EUA will remain  in effect (meaning this test can be used) for the duration of the COVID-19 declaration under Section 564(b)(1) of the Act, 21 U.S.C.section 360bbb-3(b)(1), unless the authorization is terminated  or revoked sooner.       Influenza A by PCR NEGATIVE NEGATIVE Final   Influenza B by PCR NEGATIVE NEGATIVE Final    Comment: (NOTE) The Xpert Xpress SARS-CoV-2/FLU/RSV plus assay is intended as an aid in the diagnosis of influenza from Nasopharyngeal swab specimens and should not be used as a sole basis for treatment. Nasal washings and aspirates are unacceptable for Xpert Xpress SARS-CoV-2/FLU/RSV testing.  Fact Sheet for Patients: EntrepreneurPulse.com.au  Fact Sheet for Healthcare Providers: IncredibleEmployment.be  This test is not yet approved or cleared by the [place] FDA and has been authorized for detection and/or diagnosis of SARS-CoV-2 by FDA under an Emergency Use Authorization (EUA). This EUA will remain in effect (meaning this test can be used) for the duration of the COVID-19 declaration under Section 564(b)(1) of the Act, 21 U.S.C. section 360bbb-3(b)(1), unless the authorization is terminated or revoked.  Performed at [facility], [address], [place], [place] [address]          Radiology Studies: MR Lumbar Spine W Wo Contrast  Result Date: 07/11/2020 CLINICAL DATA:  65 year old male with low back and left lower  extremity pain progressed x3 months. Spiculated left lower lobe lung mass in December. EXAM: MRI LUMBAR SPINE WITHOUT AND WITH CONTRAST TECHNIQUE: Multiplanar and multiecho pulse sequences of the lumbar spine were obtained without and with intravenous contrast. CONTRAST:  63m GADAVIST GADOBUTROL 1 MMOL/ML IV SOLN COMPARISON:  CT Chest, Abdomen, and Pelvis 05/14/2020. FINDINGS: Segmentation:  Normal on the December CT. Alignment: Grade 2 spondylolisthesis of L4 on L5, and grade 1 anterolisthesis of L5 on S1 are stable since December. No associated pars fractures. Underlying mild dextroconvex lumbar scoliosis. Vertebrae: Abnormal decreased T1 signal, STIR hyperintensity and patchy enhancement in the left sacral ala partially visible on series 7, image 11. Elsewhere the visible sacrum and left SI joint appear intact. However, left SI joint ankylosis was demonstrated on the prior CT. Subtle semicircular  area of increased STIR signal in the left posterolateral L2 vertebral body (series 7, image 9) demonstrates mild enhancement, and a subtle sclerotic lesion was present here in December. However, axial MRI appearance today raises the possibility of degenerative Schmorl's node (series 8, image 15). The lesion is 16 mm diameter by 8 mm thick. Otherwise normal marrow signal in the visible lower thoracic and lumbar spine. Mild chronic T11 superior endplate compression is stable since December. Conus medullaris and cauda equina: Conus extends to the T12-L1 level. No lower spinal cord or conus signal abnormality. No abnormal intradural enhancement. No dural thickening. Cauda equina nerve roots appear normal. Paraspinal and other soft tissues: Partially visible distended urinary bladder. Trace free fluid in the pelvis. Otherwise negative visible abdominal viscera. Negative lumbar paraspinal soft tissues. Disc levels: Capacious lower thoracic and lumbar spinal canal above L4. L4-L5: Grade 2 spondylolisthesis with advanced disc  and posterior element degeneration. Severe facet hypertrophy greater on the right with degenerative facet joint fluid. Moderate spinal stenosis and bilateral lateral recess stenosis. Moderate to severe bilateral L4 foraminal stenosis. L5-S1: Mild anterolisthesis with disc and posterior element degeneration but no spinal stenosis. There is moderate right L5 foraminal stenosis. IMPRESSION: 1. Partially visible abnormal signal in the left sacral ala with patchy enhancement. This does not seem mass-like, and more resembles a sacral insufficiency fracture than metastatic disease (but see also #2). 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 2. Small 5 x 16 mm enhancing lesion in the L2 posterosuperior vertebral body is indeterminate for small bone Metastasis versus Schmorl's node. Follow-up PET-CT might best characterize further. Failing MAC, short interval 2-3 month follow-up MRI would be most valuable. 3. No other metastatic disease identified in the visible lower thoracic or lumbar spine. 4. Degenerative grade 2 spondylolisthesis at L4-L5 with advanced disc and posterior element degeneration, moderate spinal, lateral recess, and moderate to severe foraminal stenosis. Grade 1 spondylolisthesis at L5-S1 with moderate right foraminal stenosis. Electronically Signed   By: Genevie Ann M.D.   On: 07/11/2020 08:10   CT Hip Left Wo Contrast  Result Date: 07/10/2020 CLINICAL DATA:  Left leg and arm weakness with pain since March 2021, negative radiographs EXAM: CT OF THE LEFT HIP WITHOUT CONTRAST TECHNIQUE: Multidetector CT imaging of the left hip was performed according to the standard protocol. Multiplanar CT image reconstructions were also generated. COMPARISON:  Radiograph 07/09/2020, CT 05/14/2020 FINDINGS: Bones/Joint/Cartilage The osseous structures appear diffusely demineralized which may limit detection of small or nondisplaced fractures. Ovoid lucent lesion centered within the lesser trochanter measuring  approximately 2.6 x 2.3 x 3.1 cm in size. No clear associated pathologic fracture is evident at this time. No other suspicious lytic or blastic lesions within the included margins of imaging. No other acute bony abnormality. Specifically, no fracture, subluxation, or dislocation of the left hip or included bony pelvis. More remote remote posttraumatic changes of the left pubic root, superior and inferior rami, unchanged from comparison CT. Ankylosis left SI joint and likely prior traumatic deformity of the left ilium. Degenerative change at the pubic symphysis and left hip is similar to comparison. No sizable hip effusion. Ligaments Suboptimally assessed by CT. Muscles and Tendons No acute musculotendinous abnormality is seen. Soft tissues Atherosclerotic calcifications in the iliac arteries and proximal common and superficial femoral arteries. Circumferential thickening of the urinary bladder though possibly related to underdistention. Fluid-filled appearance of the colon, correlate for rapid transit state. IMPRESSION: 1. Ovoid lucent lesion centered within the left lesser trochanter measuring approximately  2.6 x 2.3 x 3.1 cm in size. No clear associated pathologic fracture is evident at this time. Findings concerning for metastatic lesion given known left lung lesion. 2. Remote posttraumatic changes of the left pubic root, superior and inferior rami, and left ilium unchanged from comparison CT. 3. Ankylosis left SI joint. 4. Circumferential thickening of the urinary bladder though possibly related to underdistention. Correlate with urinalysis to exclude cystitis. 5. Fluid-filled appearance of the colon, could reflect diarrheal illness/rapid transit state. 6. Atherosclerosis These results were called by telephone at the time of interpretation on 07/10/2020 at 12:23 am to provider Dr. Tamala Julian, who verbally acknowledged these results. Electronically Signed   By: Lovena Le M.D.   On: 07/10/2020 00:23   DG Hip Unilat  W or Wo Pelvis 2-3 Views Left  Result Date: 07/09/2020 CLINICAL DATA:  Acute on chronic hip pain EXAM: DG HIP (WITH OR WITHOUT PELVIS) 2-3V LEFT COMPARISON:  05/13/2020 FINDINGS: Chronic deformity of left iliac bone and pubic rami. No acute displaced fracture or malalignment is seen. There are mild degenerative changes of both hips. Vascular calcifications. IMPRESSION: No acute osseous abnormality. Chronic deformity of left iliac bone and pubic rami. Electronically Signed   By: Donavan Foil M.D.   On: 07/09/2020 21:25   CT Super D Chest W Contrast  Result Date: 07/11/2020 CLINICAL DATA:  Left lower lobe mass, EBUS planning EXAM: CT CHEST WITH CONTRAST TECHNIQUE: Multidetector CT imaging of the chest was performed using thin slice collimation for electromagnetic bronchoscopy planning purposes, with intravenous contrast. CONTRAST:  74m OMNIPAQUE IOHEXOL 300 MG/ML  SOLN COMPARISON:  CT chest, 05/14/2020 FINDINGS: Cardiovascular: Aortic atherosclerosis. Normal heart size. Three-vessel coronary artery calcifications and/or stents. No pericardial effusion. Mediastinum/Nodes: No enlarged mediastinal, hilar, or axillary lymph nodes. Thyroid gland, trachea, and esophagus demonstrate no significant findings. Lungs/Pleura: Slight interval enlargement of a large mass of the superior segment left lower lobe, measuring 4.9 x 4.4 cm, previously 4.6 x 3.8 cm when measured similarly (series 3, image 40). There is postobstructive consolidation and nodularity of the dependent left lower lobe (series 3, image 51). Significant interval enlargement of a spiculated appearing nodule of the anterior left upper lobe measuring 1.4 x 1.2 cm, previously 1.1 x 1.0 cm when measured similarly (series 3, image 37). Unchanged small nodule of the superior segment right lower lobe measuring 4 mm (series 3, image 27). Slight interval enlargement of a nodule of the peripheral right upper lobe measuring 3 mm, previously no greater than 1-2 mm  (series 3, image 35). Minimal centrilobular emphysema. No pleural effusion or pneumothorax. Upper Abdomen: No acute abnormality. Musculoskeletal: No chest wall mass or suspicious bone lesions identified. IMPRESSION: 1. Slight interval enlargement of a large mass of the superior segment left lower lobe, measuring 4.9 x 4.4 cm, previously 4.6 x 3.8 cm when measured similarly. There is postobstructive consolidation and nodularity of the dependent left lower lobe. Findings are consistent with primary lung malignancy. 2. Significant interval enlargement of a spiculated appearing nodule of the anterior left upper lobe measuring 1.4 x 1.2 cm, previously 1.1 x 1.0 cm when measured similarly. This is concerning for a synchronous primary versus metastasis. 3. Slight interval enlargement of a nodule of the peripheral right upper lobe measuring 3 mm, previously no greater than 1-2 mm. This is again concerning for synchronous primary or metastasis. 4. Unchanged small nodule of the superior segment right lower lobe measuring 4 mm, nonspecific. 5. No mediastinal lymphadenopathy. 6. Minimal centrilobular emphysema. 7. Coronary artery disease.  Aortic Atherosclerosis (ICD10-I70.0) and Emphysema (ICD10-J43.9). Electronically Signed   By: Eddie Candle M.D.   On: 07/11/2020 14:10        Scheduled Meds: . acetaminophen  1,000 mg Oral TID  . buPROPion  150 mg Oral Daily  . dexamethasone  2 mg Oral Q12H  . diclofenac Sodium  2-4 g Topical QID  . feeding supplement  237 mL Oral TID BM  . gabapentin  400 mg Oral TID  . multivitamin with minerals  1 tablet Oral Daily  . nicotine  14 mg Transdermal Daily  . pantoprazole  40 mg Oral Daily   Continuous Infusions:    LOS: 0 days    Time spent: 25 minutes    Sidney Ace, MD [facility] Pager [phone]  If 7PM-7AM, please contact night-coverage 07/11/2020, 3:12 PM

## 2020-07-12 ENCOUNTER — Inpatient Hospital Stay: Payer: Medicare HMO | Admitting: Anesthesiology

## 2020-07-12 ENCOUNTER — Inpatient Hospital Stay: Payer: Medicare HMO

## 2020-07-12 ENCOUNTER — Encounter
Admission: EM | Disposition: A | Discharge status: Home Health | Payer: Self-pay | Source: Home / Self Care | Attending: Internal Medicine

## 2020-07-12 ENCOUNTER — Encounter: Payer: Self-pay | Admitting: Pulmonary Disease

## 2020-07-12 HISTORY — PX: VIDEO BRONCHOSCOPY WITH ENDOBRONCHIAL NAVIGATION: SHX6175

## 2020-07-12 LAB — BASIC METABOLIC PANEL
Anion gap: 9 (ref 5–15)
BUN: 13 mg/dL (ref 8–23)
CO2: 25 mmol/L (ref 22–32)
Calcium: 8.8 mg/dL — ABNORMAL LOW (ref 8.9–10.3)
Chloride: 98 mmol/L (ref 98–111)
Creatinine, Ser: 0.65 mg/dL (ref 0.61–1.24)
GFR, Estimated: >60 mL/min (ref >60)
Glucose, Bld: 118 mg/dL — ABNORMAL HIGH (ref 70–99)
Potassium: 3.3 mmol/L — ABNORMAL LOW (ref 3.5–5.1)
Sodium: 132 mmol/L — ABNORMAL LOW (ref 135–145)

## 2020-07-12 LAB — CBC WITH DIFFERENTIAL/PLATELET
Abs Immature Granulocytes: 0.04 10*3/uL (ref 0.00–0.07)
Basophils Absolute: 0 10*3/uL (ref 0.0–0.1)
Basophils Relative: 0 %
Eosinophils Absolute: 0 10*3/uL (ref 0.0–0.5)
Eosinophils Relative: 0 %
HCT: 29.2 % — ABNORMAL LOW (ref 39.0–52.0)
Hemoglobin: 10.3 g/dL — ABNORMAL LOW (ref 13.0–17.0)
Immature Granulocytes: 0 %
Lymphocytes Relative: 13 %
Lymphs Abs: 1.3 10*3/uL (ref 0.7–4.0)
MCH: 34 pg (ref 26.0–34.0)
MCHC: 35.3 g/dL (ref 30.0–36.0)
MCV: 96.4 fL (ref 80.0–100.0)
Monocytes Absolute: 0.6 10*3/uL (ref 0.1–1.0)
Monocytes Relative: 6 %
Neutro Abs: 8.3 10*3/uL — ABNORMAL HIGH (ref 1.7–7.7)
Neutrophils Relative %: 81 %
Platelets: 509 10*3/uL — ABNORMAL HIGH (ref 150–400)
RBC: 3.03 MIL/uL — ABNORMAL LOW (ref 4.22–5.81)
RDW: 13.2 % (ref 11.5–15.5)
WBC: 10.2 10*3/uL (ref 4.0–10.5)
nRBC: 0 % (ref 0.0–0.2)

## 2020-07-12 SURGERY — VIDEO BRONCHOSCOPY WITH ENDOBRONCHIAL NAVIGATION
Anesthesia: General | Laterality: Left

## 2020-07-12 MED ORDER — PHENYLEPHRINE HCL (PRESSORS) 10 MG/ML IV SOLN
INTRAVENOUS | Status: AC
  Filled 2020-07-12: qty 1

## 2020-07-12 MED ORDER — SUGAMMADEX SODIUM 200 MG/2ML IV SOLN
INTRAVENOUS | Status: DC | PRN
  Administered 2020-07-12: 200 mg via INTRAVENOUS

## 2020-07-12 MED ORDER — ROCURONIUM BROMIDE 10 MG/ML (PF) SYRINGE
PREFILLED_SYRINGE | INTRAVENOUS | Status: AC
  Filled 2020-07-12: qty 10

## 2020-07-12 MED ORDER — GLYCOPYRROLATE 0.2 MG/ML IJ SOLN
INTRAMUSCULAR | Status: AC
  Filled 2020-07-12: qty 1

## 2020-07-12 MED ORDER — ONDANSETRON HCL 4 MG/2ML IJ SOLN
INTRAMUSCULAR | Status: AC
  Filled 2020-07-12: qty 2

## 2020-07-12 MED ORDER — LIDOCAINE HCL URETHRAL/MUCOSAL 2 % EX GEL
1.0000 "application " | Freq: Once | CUTANEOUS | Status: DC
  Filled 2020-07-12: qty 5

## 2020-07-12 MED ORDER — PROPOFOL 10 MG/ML IV BOLUS
INTRAVENOUS | Status: AC
  Filled 2020-07-12: qty 20

## 2020-07-12 MED ORDER — DEXAMETHASONE SODIUM PHOSPHATE 10 MG/ML IJ SOLN
INTRAMUSCULAR | Status: AC
  Filled 2020-07-12: qty 1

## 2020-07-12 MED ORDER — ALUM & MAG HYDROXIDE-SIMETH 200-200-20 MG/5ML PO SUSP
15.0000 mL | ORAL | Status: DC | PRN
  Administered 2020-07-12: 15 mL via ORAL
  Filled 2020-07-12: qty 30

## 2020-07-12 MED ORDER — ENOXAPARIN SODIUM 40 MG/0.4ML ~~LOC~~ SOLN
40.0000 mg | SUBCUTANEOUS | Status: DC
  Filled 2020-07-12 (×6): qty 0.4

## 2020-07-12 MED ORDER — EPHEDRINE SULFATE 50 MG/ML IJ SOLN
INTRAMUSCULAR | Status: DC | PRN
  Administered 2020-07-12 (×2): 5 mg via INTRAVENOUS
  Administered 2020-07-12: 10 mg via INTRAVENOUS

## 2020-07-12 MED ORDER — MIDAZOLAM HCL 2 MG/2ML IJ SOLN
INTRAMUSCULAR | Status: AC
  Filled 2020-07-12: qty 2

## 2020-07-12 MED ORDER — LIDOCAINE HCL (PF) 1 % IJ SOLN
30.0000 mL | Freq: Once | INTRAMUSCULAR | Status: DC
  Filled 2020-07-12: qty 30

## 2020-07-12 MED ORDER — ROCURONIUM BROMIDE 100 MG/10ML IV SOLN
INTRAVENOUS | Status: DC | PRN
  Administered 2020-07-12: 50 mg via INTRAVENOUS

## 2020-07-12 MED ORDER — SEVOFLURANE IN SOLN
RESPIRATORY_TRACT | Status: AC
  Filled 2020-07-12: qty 250

## 2020-07-12 MED ORDER — ONDANSETRON HCL 4 MG/2ML IJ SOLN: 4.0000 mg | Freq: Once | INTRAMUSCULAR | Status: DC | PRN

## 2020-07-12 MED ORDER — FENTANYL CITRATE (PF) 100 MCG/2ML IJ SOLN: 25.0000 ug | INTRAMUSCULAR | Status: DC | PRN

## 2020-07-12 MED ORDER — DEXAMETHASONE SODIUM PHOSPHATE 10 MG/ML IJ SOLN
INTRAMUSCULAR | Status: DC | PRN
  Administered 2020-07-12: 6 mg via INTRAVENOUS

## 2020-07-12 MED ORDER — ONDANSETRON HCL 4 MG/2ML IJ SOLN
INTRAMUSCULAR | Status: DC | PRN
  Administered 2020-07-12: 4 mg via INTRAVENOUS

## 2020-07-12 MED ORDER — LIDOCAINE HCL (CARDIAC) PF 100 MG/5ML IV SOSY
PREFILLED_SYRINGE | INTRAVENOUS | Status: DC | PRN
  Administered 2020-07-12: 20 mg via INTRAVENOUS
  Administered 2020-07-12: 80 mg via INTRAVENOUS

## 2020-07-12 MED ORDER — KETAMINE HCL 50 MG/5ML IJ SOSY
PREFILLED_SYRINGE | INTRAMUSCULAR | Status: AC
  Filled 2020-07-12: qty 5

## 2020-07-12 MED ORDER — FOLIC ACID 1 MG PO TABS
1.0000 mg | ORAL_TABLET | Freq: Every day | ORAL | Status: DC
  Administered 2020-07-12 – 2020-07-19 (×8): 1 mg via ORAL
  Filled 2020-07-12 (×8): qty 1

## 2020-07-12 MED ORDER — LIDOCAINE HCL (PF) 2 % IJ SOLN
INTRAMUSCULAR | Status: AC
  Filled 2020-07-12: qty 5

## 2020-07-12 MED ORDER — MIDAZOLAM HCL 2 MG/2ML IJ SOLN
INTRAMUSCULAR | Status: DC | PRN
  Administered 2020-07-12: 1 mg via INTRAVENOUS

## 2020-07-12 MED ORDER — BUTAMBEN-TETRACAINE-BENZOCAINE 2-2-14 % EX AERO
1.0000 | INHALATION_SPRAY | Freq: Once | CUTANEOUS | Status: DC
  Filled 2020-07-12: qty 20

## 2020-07-12 MED ORDER — MORPHINE SULFATE (PF) 2 MG/ML IV SOLN: 2.0000 mg | INTRAVENOUS | Status: DC | PRN

## 2020-07-12 MED ORDER — FENTANYL CITRATE (PF) 100 MCG/2ML IJ SOLN
INTRAMUSCULAR | Status: DC | PRN
  Administered 2020-07-12 (×4): 25 ug via INTRAVENOUS

## 2020-07-12 MED ORDER — FENTANYL CITRATE (PF) 100 MCG/2ML IJ SOLN
INTRAMUSCULAR | Status: AC
  Filled 2020-07-12: qty 2

## 2020-07-12 MED ORDER — LACTATED RINGERS IV SOLN: INTRAVENOUS | Status: DC | PRN

## 2020-07-12 MED ORDER — GLYCOPYRROLATE 0.2 MG/ML IJ SOLN
INTRAMUSCULAR | Status: DC | PRN
  Administered 2020-07-12: .1 mg via INTRAVENOUS

## 2020-07-12 MED ORDER — PROPOFOL 10 MG/ML IV BOLUS
INTRAVENOUS | Status: DC | PRN
  Administered 2020-07-12: 130 mg via INTRAVENOUS

## 2020-07-12 MED ORDER — PANTOPRAZOLE SODIUM 40 MG PO TBEC
40.0000 mg | DELAYED_RELEASE_TABLET | Freq: Every day | ORAL | Status: DC
  Administered 2020-07-13 – 2020-07-19 (×7): 40 mg via ORAL
  Filled 2020-07-12 (×7): qty 1

## 2020-07-12 MED ORDER — TRAZODONE HCL 50 MG PO TABS
50.0000 mg | ORAL_TABLET | Freq: Every day | ORAL | Status: DC
  Administered 2020-07-12 – 2020-07-18 (×7): 50 mg via ORAL
  Filled 2020-07-12 (×7): qty 1

## 2020-07-12 MED ORDER — KETAMINE HCL 10 MG/ML IJ SOLN
INTRAMUSCULAR | Status: DC | PRN
  Administered 2020-07-12: 20 mg via INTRAVENOUS

## 2020-07-12 MED ORDER — PHENYLEPHRINE HCL (PRESSORS) 10 MG/ML IV SOLN
INTRAVENOUS | Status: DC | PRN
  Administered 2020-07-12: 100 ug via INTRAVENOUS
  Administered 2020-07-12: 200 ug via INTRAVENOUS
  Administered 2020-07-12: 100 ug via INTRAVENOUS
  Administered 2020-07-12: 150 ug via INTRAVENOUS
  Administered 2020-07-12: 250 ug via INTRAVENOUS

## 2020-07-12 MED ORDER — PHENYLEPHRINE HCL 0.25 % NA SOLN
1.0000 | Freq: Four times a day (QID) | NASAL | Status: DC | PRN
  Filled 2020-07-12: qty 15

## 2020-07-12 NOTE — Procedures (Signed)
ELECTROMAGNETIC NAVIGATIONAL BRONCHOSCOPY PROCEDURE NOTE  FIBEROPTIC BRONCHOSCOPY WITH BRONCHOALVEOLAR LAVAGE PROCEDURE NOTE     Flexible bronchoscopy was performed  by : Lanney Gins MD  assistance by : 1)Repiratory therapist  and 2)[facility] staff and 3) Anesthesia team and 4) Flouroscopy team   Indication for the procedure was :  Pre-procedural H&P. The following assessment was performed on the day of the procedure prior to initiating sedation History:  Chest pain n Dyspnea y Hemoptysis n Cough y Fever n Other pertinent items n  Examination Vital signs -reviewed as per nursing documentation today Cardiac    Murmurs: n  Rubs : n  Gallop: n Lungs Wheezing: n Rales : n Rhonchi :y  Other pertinent findings: SOB/hypoxemia due to chronic lung disease   Pre-procedural assessment for Procedural Sedation included: Depth of sedation: As per anesthesia team  ASA Classification:  2 Mallampati airway assessment: 3    Medication list reviewed: y  The patient's interval history was taken and revealed: no new complaints The pre- procedure physical examination revealed: No new findings Refer to prior clinic note for details.  Informed Consent: Informed consent was obtained from:  patient after explanation of procedure and risks, benefits, as well as alternative procedures available.  Explanation of level of sedation and possible transfusion was also provided.    Procedural Preparation: Time out was performed and patient was identified by name and birthdate and procedure to be performed and side for sampling, if any, was specified. Pt was intubated by anesthesia.  The patient was appropriately draped.   Fiberoptic bronchoscopy with airway inspection and BAL Procedure findings:  Bronchoscope was inserted via ETT  without difficulty.  Posterior oropharynx, epiglottis, arytenoids, false cords and vocal cords were not visualized as these were bypassed by endotracheal tube.  The distal trachea was normal in circumference and appearance without mucosal, cartilaginous or branching abnormalities.  The main carina was mildly splayed . All right and left lobar airways were visualized to the Subsegmental level.  Sub- sub segmental carinae were identified in all the distal airways.   Secretions were visible in the following airways and appeared to be clear.  The mucosa was : friable at LLL    Airways were notable for:        exophytic lesions n       extrinsic compression in the following distributions: n.       Friable mucosa: y       Neurosurgeon /pigmentation: n     Post procedure Diagnosis:   BAL was performed at [employer] B7/8 segment with serosang and celluar return X3      Electromagnetic Navigational Bronchoscopy Procedure Findings:  After appropriate CT-guided planning ENB scope was advanced via endotracheal tube and LG was advanced for registration.  Post appropriate planning and registration peripheral navigation was used to visualize target lesion.     Target lesion of LLL - Cytobrush x 2 - lesional cells +     And   Surgical forcep biopsy x 8 -+ LESIONAL CELLS CHARACTERISTIC OF NSCLC    Post procedure diagnosis:   LUNG CANCER     HILAR AND MEDIASTINAL LYMPH NODE STATIONS WERE NORMAL EBUS WAS NOT PERFORMED   Specimens obtained included:           Cytology brushes : LLL  Broncho-alveolar lavage site:LLL   sent for CYTOLOGY  55m volume infused 672mvolume returned with SEMoore Havenappearance   Fluoroscopy Used: YES ;        Pictorial documentation attached: NO     Immediate sampling complications included:NONE  Epinephrine ZERO ml was used topically  The bronchoscopy was terminated due to completion of the planned procedure and the bronchoscope was removed.   Total dosage of Lidocaine was ZERO mg Total fluoroscopy time was 0.03  minutes   Estimated Blood loss: 5 cc.  Complications  included:  NONE     Disposition: PATIENT IS STILL HOSPITALIZED  Follow up with Dr. AlLanney Ginsn 5 days for result discussion.     FuOttie GlazierD  KC[facility]

## 2020-07-12 NOTE — Consult Note (Signed)
Referring Physician:  No referring provider defined for this encounter.  Primary Physician:  Patient, No Pcp Per  Chief Complaint:  L leg weakness, anterolisthesis  History of Present Illness: 07/12/2020 Kevin Baker is a 65 y.o. male who presents with the chief complaint of left hip pain and worsening ambulation.  He has a left lung lesion concerning for lung CA.  He was seen by Dr. Posey Pronto in orthopedics, who asked me to see patient after lumbar MRI showed anterolisthesis of L4 on L5.    Patient reports 45 lb weight loss in the past 6 months.  He has severe pain into his L anterior thigh.  He has weakness of knee extension.  He has had some trouble walking.  He denies bowel difficulty.  He has had significant diarrhea.    He has worse symptoms.    Review of Systems:  A 10 point review of systems is negative, except for the pertinent positives and negatives detailed in the HPI.  Past Medical History: Past Medical History:  Diagnosis Date  . Alcohol use disorder, severe, dependence ([place])   . Mass of lower lobe of left lung     Past Surgical History: Past Surgical History:  Procedure Laterality Date  . BACK SURGERY    . LEG SURGERY Right   . PELVIC FRACTURE SURGERY      Allergies: Allergies as of 07/09/2020 - Review Complete 07/09/2020  Allergen Reaction Noted  . Codeine Nausea And Vomiting 04/01/2019  . Sulfamethoxazole-trimethoprim Nausea And Vomiting 04/01/2019    Medications:  Current Facility-Administered Medications:  .  acetaminophen (TYLENOL) tablet 1,000 mg, 1,000 mg, Oral, TID, Sreenath, Sudheer B, MD, 1,000 mg at 07/12/20 1045 .  baclofen (LIORESAL) tablet 10 mg, 10 mg, Oral, TID PRN, Zada Finders R, MD .  buPROPion [facility] SR) 12 hr tablet 150 mg, 150 mg, Oral, Daily, Zada Finders R, MD, 150 mg at 07/12/20 1045 .  dexamethasone (DECADRON) tablet 2 mg, 2 mg, Oral, Q12H, Borders, Vonna Kotyk R, NP, 2 mg at 07/12/20 1045 .  diclofenac Sodium (VOLTAREN) 1  % topical gel 2-4 g, 2-4 g, Topical, QID, Zada Finders R, MD, 4 g at 07/12/20 1050 .  feeding supplement (ENSURE ENLIVE / ENSURE PLUS) liquid 237 mL, 237 mL, Oral, TID BM, Sreenath, Sudheer B, MD, 237 mL at 07/12/20 1050 .  gabapentin (NEURONTIN) capsule 400 mg, 400 mg, Oral, TID, Sreenath, Sudheer B, MD, 400 mg at 07/12/20 1045 .  hydrOXYzine (VISTARIL) capsule 25 mg, 25 mg, Oral, QID PRN, Zada Finders R, MD .  morphine 2 MG/ML injection 2 mg, 2 mg, Intravenous, Q3H PRN, Priscella Mann, Sudheer B, MD, 2 mg at 07/11/20 0544 .  multivitamin with minerals tablet 1 tablet, 1 tablet, Oral, Daily, Sreenath, Sudheer B, MD, 1 tablet at 07/12/20 1045 .  nicotine (NICODERM CQ - dosed in mg/24 hours) patch 14 mg, 14 mg, Transdermal, Daily, Zada Finders R, MD, 14 mg at 07/10/20 0335 .  nicotine polacrilex (NICORETTE) gum 2 mg, 2 mg, Oral, PRN, Priscella Mann, Sudheer B, MD, 2 mg at 07/12/20 1046 .  ondansetron (ZOFRAN) tablet 4 mg, 4 mg, Oral, Q6H PRN, 4 mg at 07/10/20 2114 **OR** ondansetron (ZOFRAN) injection 4 mg, 4 mg, Intravenous, Q6H PRN, Posey Pronto, Vishal R, MD .  oxyCODONE (Oxy IR/ROXICODONE) immediate release tablet 5 mg, 5 mg, Oral, Q4H PRN, Priscella Mann, Sudheer B, MD, 5 mg at 07/12/20 1045 .  pantoprazole (PROTONIX) EC tablet 40 mg, 40 mg, Oral, Daily, Sharion Settler, NP, 40 mg at  07/12/20 1045 .  senna-docusate (Senokot-S) tablet 1 tablet, 1 tablet, Oral, QHS PRN, Lenore Cordia, MD   Social History: Social History   Tobacco Use  . Smoking status: Current Every Day Smoker    Packs/day: 3.00    Types: Cigarettes  . Smokeless tobacco: Never Used  Substance Use Topics  . Alcohol use: Not Currently    Comment: for last 3 weeks, drinking daily   . Drug use: Not Currently    Family Medical History: Family History  Problem Relation Age of Onset  . Heart disease Father     Physical Examination: Vitals:   07/12/20 0928 07/12/20 1143  BP: 103/70 97/61  Pulse: 81 79  Resp: 16 16  Temp: 98.6 F (37  C) 97.8 F (36.6 C)  SpO2: 99% 100%     General: Patient is cachectic.    Psychiatric: Patient is non-anxious.  Head:  Pupils equal, round, and reactive to light.  ENT:  Oral mucosa appears well hydrated but has no teeth.  Neck:   Supple.  Full range of motion.  Respiratory: Patient is breathing without any difficulty.  Extremities: No edema.  Vascular: Palpable pulses in dorsal pedal vessels.  Skin:   On exposed skin, there are no abnormal skin lesions.  NEUROLOGICAL:  General: In no acute distress.   Awake, alert, oriented to person, place, and time.  Pupils equal round and reactive to light.  Facial tone is symmetric.  Tongue protrusion is midline.  There is no pronator drift.   Strength: Side Biceps Triceps Deltoid Interossei Grip Wrist Ext. Wrist Flex.  R _0 L _1 Side Iliopsoas Quads Hamstring PF DF EHL  R _2 L _3 Reflexes are 1+ and symmetric at the biceps, triceps, brachioradialis, patella and achilles.   Bilateral upper and lower extremity sensation is intact to light touch except L anterior thigh which is diminished.  Gait is untested.  Hoffman's is absent.  Imaging: MRI L spine 07/11/20 IMPRESSION: 1. Partially visible abnormal signal in the left sacral ala with patchy enhancement. This does not seem mass-like, and more resembles a sacral insufficiency fracture than metastatic disease (but see also #2). 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  2. Small 5 x 16 mm enhancing lesion in the L2 posterosuperior vertebral body is indeterminate for small bone Metastasis versus Schmorl's node. Follow-up PET-CT might best characterize further. Failing MAC, short interval 2-3 month follow-up MRI would be most valuable.  3. No other metastatic disease identified in the visible lower thoracic or lumbar spine.  4. Degenerative grade 2 spondylolisthesis at L4-L5 with advanced disc and posterior element  degeneration, moderate spinal, lateral recess, and moderate to severe foraminal stenosis. Grade 1 spondylolisthesis at L5-S1 with moderate right foraminal stenosis.   Electronically Signed   By: Genevie Ann M.D.   On: 07/11/2020 08:10  I have personally reviewed the images and agree with the above interpretation.  Labs: CBC Latest Ref Rng & Units 07/12/2020 07/10/2020 07/09/2020  WBC 4.0 - 10.5 K/uL 10.2 8.4 8.6  Hemoglobin 13.0 - 17.0 g/dL 10.3(L) 10.4(L) 9.9(L)  Hematocrit 39.0 - 52.0 % 29.2(L) 29.1(L) 28.7(L)  Platelets 150 - 400 K/uL 509(H) 501(H) 509(H)     Assessment and Plan: Mr. Puthoff is a pleasant 65 y.o. male with substantial left leg weakness due to pain and severe  anterolisthesis and nerve root compression.  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  His leg weakness has been ongoing for some time.  If he does well with treatment and his health improves, I would be happy to see him to consider intervention.   Wilver Tignor K. Izora Ribas MD, [facility]

## 2020-07-12 NOTE — Progress Notes (Signed)
Pulmonary Medicine          Date: 07/12/2020,   MRN# [id number] Mak Bonny April 06, 1956     AdmissionWeight: 68 kg                 CurrentWeight: 58.9 kg      CHIEF COMPLAINT:   Lung mass of LLL 4.3cm    HISTORY OF PRESENT ILLNESS   Daxten Kovalenko is a 65 y.o. 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  Remote posttraumatic changes of the left pubic root, superior and inferior rami and left ilium are unchanged from prior CT.  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  Fluid-filled appearance of the colon also seen.  Patient was given 1 L normal saline and the hospitalist service was consulted to admit for further evaluation and management.  Patient is not using any oxygen at this time.  He stopped smoking last 2 weeks he stopped drinking alcohol 2 monts ago. [name]  He is not coughing.  Pulmonary consultation for tissue diagnosis of LLL mass.  Reviweed with patient he isa greeable to  Biopsy.    07/11/20-  Patient for bronchoscopy and biopsy in am.  07/12/20- patient is evaluated this am prior to procedure. Remains on room air no events overnight.                   PAST MEDICAL HISTORY   Past Medical History:  Diagnosis Date  . Alcohol use disorder, severe, dependence ([place])   . Mass of lower lobe of left lung      SURGICAL HISTORY   Past Surgical History:   Procedure Laterality Date  . BACK SURGERY    . LEG SURGERY Right   . PELVIC FRACTURE SURGERY       FAMILY HISTORY   Family History  Problem Relation Age of Onset  . Heart disease Father      SOCIAL HISTORY   Social History   Tobacco Use  . Smoking status: Current Every Day Smoker    Packs/day: 3.00    Types: Cigarettes  . Smokeless tobacco: Never Used  Substance Use Topics  . Alcohol use: Not Currently    Comment: for last 3 weeks, drinking daily   . Drug use: Not Currently     MEDICATIONS    Home Medication:    Current Medication:  Current Facility-Administered Medications:  .  [MAR Hold] acetaminophen (TYLENOL) tablet 1,000 mg, 1,000 mg, Oral, TID, Priscella Mann, Sudheer B, MD, 1,000 mg at 07/11/20 2021 .  [MAR Hold] baclofen (LIORESAL) tablet 10 mg, 10 mg, Oral, TID PRN, Lenore Cordia, MD .  Doug Sou Hold] buPROPion [facility] SR) 12 hr tablet 150 mg, 150 mg, Oral, Daily, Zada Finders R, MD, 150 mg at 07/11/20 1007 .  butamben-tetracaine-benzocaine (CETACAINE) spray 1 spray, 1 spray, Topical, Once, Ottie Glazier, MD .  Doug Sou Hold] dexamethasone (DECADRON) tablet 2 mg, 2 mg, Oral,  Q12H, Borders, Kirt Boys, NP, 2 mg at 07/11/20 2021 .  [MAR Hold] diclofenac Sodium (VOLTAREN) 1 % topical gel 2-4 g, 2-4 g, Topical, QID, Zada Finders R, MD, 4 g at 07/11/20 2022 .  [MAR Hold] feeding supplement (ENSURE ENLIVE / ENSURE PLUS) liquid 237 mL, 237 mL, Oral, TID BM, Sreenath, Sudheer B, MD, 237 mL at 07/11/20 2020 .  [MAR Hold] gabapentin (NEURONTIN) capsule 400 mg, 400 mg, Oral, TID, Priscella Mann, Sudheer B, MD, 400 mg at 07/11/20 2021 .  [MAR Hold] hydrOXYzine (VISTARIL) capsule 25 mg, 25 mg, Oral, QID PRN, Patel, Vishal R, MD .  lidocaine (PF) (XYLOCAINE) 1 % injection 30 mL, 30 mL, Infiltration, Once, Ariela Mochizuki, MD .  lidocaine (XYLOCAINE) 2 % jelly 1 application, 1 application, Topical, Once, Ottie Glazier, MD .  Doug Sou Hold] morphine 2 MG/ML injection 2 mg, 2 mg,  Intravenous, Q3H PRN, Ralene Muskrat B, MD, 2 mg at 07/11/20 0544 .  [MAR Hold] multivitamin with minerals tablet 1 tablet, 1 tablet, Oral, Daily, Sreenath, Sudheer B, MD, 1 tablet at 07/11/20 1006 .  [MAR Hold] nicotine (NICODERM CQ - dosed in mg/24 hours) patch 14 mg, 14 mg, Transdermal, Daily, Zada Finders R, MD, 14 mg at 07/10/20 0335 .  [MAR Hold] nicotine polacrilex (NICORETTE) gum 2 mg, 2 mg, Oral, PRN, Priscella Mann, Sudheer B, MD, 2 mg at 07/11/20 1537 .  [MAR Hold] ondansetron (ZOFRAN) tablet 4 mg, 4 mg, Oral, Q6H PRN, 4 mg at 07/10/20 2114 **OR** [MAR Hold] ondansetron (ZOFRAN) injection 4 mg, 4 mg, Intravenous, Q6H PRN, Lenore Cordia, MD .  Doug Sou Hold] oxyCODONE (Oxy IR/ROXICODONE) immediate release tablet 5 mg, 5 mg, Oral, Q4H PRN, Priscella Mann, Sudheer B, MD, 5 mg at 07/11/20 2021 .  [MAR Hold] pantoprazole (PROTONIX) EC tablet 40 mg, 40 mg, Oral, Daily, Sharion Settler, NP, 40 mg at 07/11/20 1006 .  phenylephrine (NEO-SYNEPHRINE) 0.25 % nasal spray 1 spray, 1 spray, Each Nare, Q6H PRN, Ottie Glazier, MD .  Doug Sou Hold] senna-docusate (Senokot-S) tablet 1 tablet, 1 tablet, Oral, QHS PRN, Lenore Cordia, MD  Facility-Administered Medications Ordered in Other Encounters:  .  dexamethasone (DECADRON) injection, , Intravenous, Anesthesia Intra-op, Lia Foyer, CRNA, 6 mg at 07/12/20 0748 .  ePHEDrine injection, , Intravenous, Anesthesia Intra-op, Lia Foyer, CRNA, 5 mg at 07/12/20 0810 .  fentaNYL (SUBLIMAZE) injection, , Intravenous, Anesthesia Intra-op, Lia Foyer, CRNA, 25 mcg at 07/12/20 9924 .  glycopyrrolate (ROBINUL) injection, , Intravenous, Anesthesia Intra-op, Lia Foyer, CRNA, 0.1 mg at 07/12/20 0759 .  ketamine (KETALAR) injection, , Intravenous, Anesthesia Intra-op, Lia Foyer, CRNA, 20 mg at 07/12/20 0748 .  lactated ringers infusion, , Intravenous, Continuous PRN, Lia Foyer, CRNA, New Bag at 07/12/20 [phone] .  lidocaine (cardiac) 100  mg/75m (XYLOCAINE) injection 2%, , Intravenous, Anesthesia Intra-op, CLia Foyer CRNA, 20 mg at 07/12/20 04196.  midazolam (VERSED) injection, , Intravenous, Anesthesia Intra-op, CLia Foyer CRNA, 1 mg at 07/12/20 02229.  ondansetron (ZOFRAN) injection, , Intravenous, Anesthesia Intra-op, CLia Foyer CRNA, 4 mg at 07/12/20 0748 .  phenylephrine (NEO-SYNEPHRINE) injection, , Intravenous, Anesthesia Intra-op, CLia Foyer CRNA, 100 mcg at 07/12/20 0810 .  propofol (DIPRIVAN) 10 mg/mL bolus/IV push, , Intravenous, Anesthesia Intra-op, CLia Foyer CRNA, 130 mg at 07/12/20 07989.  rocuronium (ZEMURON) injection, , Intravenous, Anesthesia Intra-op, CLia Foyer CRNA, 50 mg at 07/12/20 0739 .  sugammadex sodium (BRIDION) injection, , Intravenous, Anesthesia Intra-op, CFloyce Stakes  W, CRNA, 200 mg at 07/12/20 0240    ALLERGIES   Codeine, Sulfamethoxazole-trimethoprim, and Lactose intolerance (gi)     REVIEW OF SYSTEMS    Review of Systems:  Gen:  Denies  fever, sweats, chills weigh loss  HEENT: Denies blurred vision, double vision, ear pain, eye pain, hearing loss, nose bleeds, sore throat Cardiac:  No dizziness, chest pain or heaviness, chest tightness,edema Resp:   Denies cough or sputum porduction, shortness of breath,wheezing, hemoptysis,  Gi: Denies swallowing difficulty, stomach pain, nausea or vomiting, diarrhea, constipation, bowel incontinence Gu:  Denies bladder incontinence, burning urine Ext:   Denies Joint pain, stiffness or swelling Skin: Denies  skin rash, easy bruising or bleeding or hives Endoc:  Denies polyuria, polydipsia , polyphagia or weight change Psych:   Denies depression, insomnia or hallucinations   Other:  All other systems negative   VS: BP 103/72   Pulse 75   Temp 97.8 F (36.6 C) (Oral)   Resp 14   Ht 6' (1.829 m)   Wt 58.9 kg   SpO2 100%   BMI 17.61 kg/m      PHYSICAL EXAM    GENERAL:NAD, no fevers,  chills, no weakness no fatigue HEAD: Normocephalic, atraumatic.  EYES: Pupils equal, round, reactive to light. Extraocular muscles intact. No scleral icterus.  MOUTH: Moist mucosal membrane. Dentition intact. No abscess noted.  EAR, NOSE, THROAT: Clear without exudates. No external lesions.  NECK: Supple. No thyromegaly. No nodules. No JVD.  PULMONARY: decreased air entry at LLL, with rhonchi bialterally  CARDIOVASCULAR: S1 and S2. Regular rate and rhythm. No murmurs, rubs, or gallops. No edema. Pedal pulses 2+ bilaterally.  GASTROINTESTINAL: Soft, nontender, nondistended. No masses. Positive bowel sounds. No hepatosplenomegaly.  MUSCULOSKELETAL: No swelling, clubbing, or edema. Range of motion full in all extremities.  NEUROLOGIC: Cranial nerves II through XII are intact. No gross focal neurological deficits. Sensation intact. Reflexes intact.  SKIN: No ulceration, lesions, rashes, or cyanosis. Skin warm and dry. Turgor intact.  PSYCHIATRIC: Mood, affect within normal limits. The patient is awake, alert and oriented x 3. Insight, judgment intact.       IMAGING    MR Lumbar Spine W Wo Contrast  Result Date: 07/11/2020 CLINICAL DATA:  65 year old male with low back and left lower extremity pain progressed x3 months. Spiculated left lower lobe lung mass in December. EXAM: MRI LUMBAR SPINE WITHOUT AND WITH CONTRAST TECHNIQUE: Multiplanar and multiecho pulse sequences of the lumbar spine were obtained without and with intravenous contrast. CONTRAST:  64m GADAVIST GADOBUTROL 1 MMOL/ML IV SOLN COMPARISON:  CT Chest, Abdomen, and Pelvis 05/14/2020. FINDINGS: Segmentation:  Normal on the December CT. Alignment: Grade 2 spondylolisthesis of L4 on L5, and grade 1 anterolisthesis of L5 on S1 are stable since December. No associated pars fractures. Underlying mild dextroconvex lumbar scoliosis. Vertebrae: Abnormal decreased T1 signal, STIR hyperintensity and patchy enhancement in the left sacral ala  partially visible on series 7, image 11. Elsewhere the visible sacrum and left SI joint appear intact. However, left SI joint ankylosis was demonstrated on the prior CT. Subtle semicircular area of increased STIR signal in the left posterolateral L2 vertebral body (series 7, image 9) demonstrates mild enhancement, and a subtle sclerotic lesion was present here in December. However, axial MRI appearance today raises the possibility of degenerative Schmorl's node (series 8, image 15). The lesion is 16 mm diameter by 8 mm thick. Otherwise normal marrow signal in the visible lower thoracic and lumbar spine. Mild chronic T11  superior endplate compression is stable since December. Conus medullaris and cauda equina: Conus extends to the T12-L1 level. No lower spinal cord or conus signal abnormality. No abnormal intradural enhancement. No dural thickening. Cauda equina nerve roots appear normal. Paraspinal and other soft tissues: Partially visible distended urinary bladder. Trace free fluid in the pelvis. Otherwise negative visible abdominal viscera. Negative lumbar paraspinal soft tissues. Disc levels: Capacious lower thoracic and lumbar spinal canal above L4. L4-L5: Grade 2 spondylolisthesis with advanced disc and posterior element degeneration. Severe facet hypertrophy greater on the right with degenerative facet joint fluid. Moderate spinal stenosis and bilateral lateral recess stenosis. Moderate to severe bilateral L4 foraminal stenosis. L5-S1: Mild anterolisthesis with disc and posterior element degeneration but no spinal stenosis. There is moderate right L5 foraminal stenosis. IMPRESSION: 1. Partially visible abnormal signal in the left sacral ala with patchy enhancement. This does not seem mass-like, and more resembles a sacral insufficiency fracture than metastatic disease (but see also #2). 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 2. Small 5 x 16 mm enhancing lesion in the L2 posterosuperior vertebral  body is indeterminate for small bone Metastasis versus Schmorl's node. Follow-up PET-CT might best characterize further. Failing MAC, short interval 2-3 month follow-up MRI would be most valuable. 3. No other metastatic disease identified in the visible lower thoracic or lumbar spine. 4. Degenerative grade 2 spondylolisthesis at L4-L5 with advanced disc and posterior element degeneration, moderate spinal, lateral recess, and moderate to severe foraminal stenosis. Grade 1 spondylolisthesis at L5-S1 with moderate right foraminal stenosis. Electronically Signed   By: Genevie Ann M.D.   On: 07/11/2020 08:10   CT Hip Left Wo Contrast  Result Date: 07/10/2020 CLINICAL DATA:  Left leg and arm weakness with pain since March 2021, negative radiographs EXAM: CT OF THE LEFT HIP WITHOUT CONTRAST TECHNIQUE: Multidetector CT imaging of the left hip was performed according to the standard protocol. Multiplanar CT image reconstructions were also generated. COMPARISON:  Radiograph 07/09/2020, CT 05/14/2020 FINDINGS: Bones/Joint/Cartilage The osseous structures appear diffusely demineralized which may limit detection of small or nondisplaced fractures. Ovoid lucent lesion centered within the lesser trochanter measuring approximately 2.6 x 2.3 x 3.1 cm in size. No clear associated pathologic fracture is evident at this time. No other suspicious lytic or blastic lesions within the included margins of imaging. No other acute bony abnormality. Specifically, no fracture, subluxation, or dislocation of the left hip or included bony pelvis. More remote remote posttraumatic changes of the left pubic root, superior and inferior rami, unchanged from comparison CT. Ankylosis left SI joint and likely prior traumatic deformity of the left ilium. Degenerative change at the pubic symphysis and left hip is similar to comparison. No sizable hip effusion. Ligaments Suboptimally assessed by CT. Muscles and Tendons No acute musculotendinous abnormality  is seen. Soft tissues Atherosclerotic calcifications in the iliac arteries and proximal common and superficial femoral arteries. Circumferential thickening of the urinary bladder though possibly related to underdistention. Fluid-filled appearance of the colon, correlate for rapid transit state. IMPRESSION: 1. Ovoid lucent lesion centered within the left lesser trochanter measuring approximately 2.6 x 2.3 x 3.1 cm in size. No clear associated pathologic fracture is evident at this time. Findings concerning for metastatic lesion given known left lung lesion. 2. Remote posttraumatic changes of the left pubic root, superior and inferior rami, and left ilium unchanged from comparison CT. 3. Ankylosis left SI joint. 4. Circumferential thickening of the urinary bladder though possibly related to underdistention. Correlate with urinalysis to exclude  cystitis. 5. Fluid-filled appearance of the colon, could reflect diarrheal illness/rapid transit state. 6. Atherosclerosis These results were called by telephone at the time of interpretation on 07/10/2020 at 12:23 am to provider Dr. Tamala Julian, who verbally acknowledged these results. Electronically Signed   By: Lovena Le M.D.   On: 07/10/2020 00:23   DG C-Arm 1-60 Min-No Report  Result Date: 07/12/2020 Fluoroscopy was utilized by the requesting physician.  No radiographic interpretation.   DG Hip Unilat W or Wo Pelvis 2-3 Views Left  Result Date: 07/09/2020 CLINICAL DATA:  Acute on chronic hip pain EXAM: DG HIP (WITH OR WITHOUT PELVIS) 2-3V LEFT COMPARISON:  05/13/2020 FINDINGS: Chronic deformity of left iliac bone and pubic rami. No acute displaced fracture or malalignment is seen. There are mild degenerative changes of both hips. Vascular calcifications. IMPRESSION: No acute osseous abnormality. Chronic deformity of left iliac bone and pubic rami. Electronically Signed   By: Donavan Foil M.D.   On: 07/09/2020 21:25   CT Super D Chest W Contrast  Result Date:  07/11/2020 CLINICAL DATA:  Left lower lobe mass, EBUS planning EXAM: CT CHEST WITH CONTRAST TECHNIQUE: Multidetector CT imaging of the chest was performed using thin slice collimation for electromagnetic bronchoscopy planning purposes, with intravenous contrast. CONTRAST:  77m OMNIPAQUE IOHEXOL 300 MG/ML  SOLN COMPARISON:  CT chest, 05/14/2020 FINDINGS: Cardiovascular: Aortic atherosclerosis. Normal heart size. Three-vessel coronary artery calcifications and/or stents. No pericardial effusion. Mediastinum/Nodes: No enlarged mediastinal, hilar, or axillary lymph nodes. Thyroid gland, trachea, and esophagus demonstrate no significant findings. Lungs/Pleura: Slight interval enlargement of a large mass of the superior segment left lower lobe, measuring 4.9 x 4.4 cm, previously 4.6 x 3.8 cm when measured similarly (series 3, image 40). There is postobstructive consolidation and nodularity of the dependent left lower lobe (series 3, image 51). Significant interval enlargement of a spiculated appearing nodule of the anterior left upper lobe measuring 1.4 x 1.2 cm, previously 1.1 x 1.0 cm when measured similarly (series 3, image 37). Unchanged small nodule of the superior segment right lower lobe measuring 4 mm (series 3, image 27). Slight interval enlargement of a nodule of the peripheral right upper lobe measuring 3 mm, previously no greater than 1-2 mm (series 3, image 35). Minimal centrilobular emphysema. No pleural effusion or pneumothorax. Upper Abdomen: No acute abnormality. Musculoskeletal: No chest wall mass or suspicious bone lesions identified. IMPRESSION: 1. Slight interval enlargement of a large mass of the superior segment left lower lobe, measuring 4.9 x 4.4 cm, previously 4.6 x 3.8 cm when measured similarly. There is postobstructive consolidation and nodularity of the dependent left lower lobe. Findings are consistent with primary lung malignancy. 2. Significant interval enlargement of a spiculated  appearing nodule of the anterior left upper lobe measuring 1.4 x 1.2 cm, previously 1.1 x 1.0 cm when measured similarly. This is concerning for a synchronous primary versus metastasis. 3. Slight interval enlargement of a nodule of the peripheral right upper lobe measuring 3 mm, previously no greater than 1-2 mm. This is again concerning for synchronous primary or metastasis. 4. Unchanged small nodule of the superior segment right lower lobe measuring 4 mm, nonspecific. 5. No mediastinal lymphadenopathy. 6. Minimal centrilobular emphysema. 7. Coronary artery disease. Aortic Atherosclerosis (ICD10-I70.0) and Emphysema (ICD10-J43.9). Electronically Signed   By: AEddie CandleM.D.   On: 07/11/2020 14:10      ASSESSMENT/PLAN   4.6 x 3.8 cm spiculated mass is noted in the left lower lobe   - reviewed with patient he  wishes to have biopsy and treatment of cancer   - we reviewed ENB/EBUS procedure   - patietn wishes to proceed with bronchosopic evaluation   - willhold lovenox and place on SCDs   -SUPER D CT CHEST - today 07/11/20   - NPO post midnight Thursday   - PROCEDURE 730 AM  for Friday 07/12/20 --Reviewed risks/complications and benefits with patient, risks include infection, pneumothorax/pneumomediastinum which may require chest tube placement as well as overnight/prolonged hospitalization and possible mechanical ventilation. Other risks include bleeding and very rarely death.  Patient understands risks and wishes to proceed.  Additional questions were answered, and patient is aware that post procedure patient will be going home with family and may experience cough with possible clots on expectoration as well as phlegm which may last few days as well as hoarseness of voice post intubation and mechanical ventilation.    Thank you for allowing me to participate in the care of this patient.   Patient/Family are satisfied with care plan and all questions have been answered.  This document was  prepared using Dragon voice recognition software and may include unintentional dictation errors.     Ottie Glazier, M.D.  Division of [place]

## 2020-07-12 NOTE — Anesthesia Procedure Notes (Addendum)
Procedure Name: Intubation Date/Time: 07/12/2020 7:42 AM Performed by: Lia Foyer, CRNA Pre-anesthesia Checklist: Patient identified, Emergency Drugs available, Suction available and Patient being monitored Patient Re-evaluated:Patient Re-evaluated prior to induction Oxygen Delivery Method: Circle system utilized Preoxygenation: Pre-oxygenation with 100% oxygen Induction Type: IV induction Ventilation: Mask ventilation without difficulty Laryngoscope Size: McGraph and 4 Grade View: Grade I Tube type: Oral Tube size: 8.5 mm Number of attempts: 1 Airway Equipment and Method: Stylet and Video-laryngoscopy Placement Confirmation: ETT inserted through vocal cords under direct vision,  positive ETCO2 and breath sounds checked- equal and bilateral Secured at: 21 cm Tube secured with: Tape Dental Injury: Teeth and Oropharynx as per pre-operative assessment

## 2020-07-12 NOTE — TOC Progression Note (Signed)
Transition of Care [facility]) - Progression Note    Patient Details  Name: Kevin Baker MRN: [id number] Date of Birth: Sep 21, 1955  Transition of [facility]) CM/SW Ar[place], LCSW Phone Number: 07/12/2020, 12:49 PM  Clinical Narrative:  Per MD, plan to start daily palliative chemo/radiation on Monday. A SNF will not be able to transport patient every day for treatment. Updated [place] admissions coordinator.   Expected Discharge Plan: [place] Barriers to Discharge: Continued Medical Work up  Expected Discharge Plan and Services Expected Discharge Plan: [place] arrangements for the past 2 months: Single Family Home Expected Discharge Date: 07/10/20                                     Social Determinants of Health (SDOH) Interventions    Readmission Risk Interventions No flowsheet data found.

## 2020-07-12 NOTE — Progress Notes (Signed)
PROGRESS NOTE    Kevin Baker  NGE:[id number] DOB: Jan 20, 1956 DOA: 07/09/2020 PCP: Patient, No Pcp Per   Brief Narrative:  Patient with history of alcohol use disorder, tobacco use, hepatitis C, chronic gastritis, chronic pain who presents for evaluation of worsening left hip pain and difficulty ambulating.  He was previously seen in the hospital in December 2021 for similar symptoms.  At that time the primary bronchogenic carcinoma was made aware to him and he was recommended to stay for further evaluation and management however during that admission he elected to leave [place].  He was seen by outpatient orthopedics and was given a prescription for Neurontin for left hip pain and a referral to neurology.  Patient is not followed up.  Presented to ED with progressive worsening hip pain.  I have communicated with oncology who agreed to consult on the patient.  Recommendations appreciated.  Per oncology requesting orthopedic evaluation for surgical fixation of a osseous lesion in the left femur.  Palliative care also involved.  Orthopedics consulted, not recommending surgical fixation at this time.  Communicated with medical oncology and pulmonology.  We will plan for bronchoscopy/EBUS for definitive diagnosis on 2/25.  Radiation oncology contacted for palliative radiation.  Patient underwent successful bronchoscopy/transbronchial biopsy on 2/25.  Results will be available in approximately 5 days.  Patient was also seen by neurosurgery at the request orthopedist.  No neurosurgical intervention warranted at this time.  Patient is scheduled for CT mapping and initiation of palliative radiation early next week.  This may present issues with disposition and skilled nursing facility placement.   Assessment & Plan:   Principal Problem:   Acute on chronic pain of left hip Active Problems:   Mass of lower lobe of left lung   Hypokalemia   Hypomagnesemia   Normocytic anemia   Palliative  care encounter   Protein-calorie malnutrition, severe   Cancer associated pain  Acute on chronic left hip pain with generalized weakness and frequent falls Degenerative joint disease of the left hip: CT imaging of the left hip concerning for metastatic lesion at the left lesser trochanter.  Also shows remote posttraumatic changes of the left pubic root, superior and inferior rami, left ilium and ankylosis at the left SI joint. -Oncology consult requested -Orthopedic consult requested -Radiation oncology requested Plan: Continue pain control.  Therapy evaluations as able.  Plan for palliative radiation next week.  We will have to determine best course of action skilled nursing facility likely will not be able to transport for daily radiation  Left lower lobe lung mass: 4.6 x 3.8 spiculated mass seen at the left lower lobe on CT imaging 05/14/2020 concerning for primary bronchogenic carcinoma.  Has not had outpatient follow-up for this yet.  Discussed findings with patient. -Status post transbronchial biopsy on 2/25.  Appreciate pulmonology involvement.  Results should be available in 5 days.  Alcohol use disorder: Reports he quit drinking about 2 months ago.  Not currently exhibiting any signs of withdrawal.  Continue CIWA checks without Ativan for now.  Normocytic anemia: Without obvious bleeding.   No indication for transfusion Iron indices indicate anemia of chronic disease  Hypokalemia/hypomagnesemia: Monitor replace as necessary  Tobacco use: Reports cutting back to 1.5 PPD per week.  Patient strongly advised to quit smoking completely given likely bronchogenic cancer.  Nicotine patch and lozenges ordered.   DVT prophylaxis: SQ Lovenox Code Status: Full Family Communication: None today Disposition Plan: Status is: Inpatient  Remains inpatient appropriate because:Inpatient level of  care appropriate due to severity of illness   Dispo: The patient is from: Home               Anticipated d/c is to: SNF              Patient currently is not medically stable to d/c.   Difficult to place patient Yes   Patient will represent a disposition issue as he is scheduled for palliative radiation next week.  Current disposition plan for skilled nursing facility may be difficult as the facility will not transport to daily radiation.                Level of care: Med-Surg  Consultants:   Oncology  Orthopedics  Palliative care  Pulmonology  Procedures:   None  Antimicrobials:   None   Subjective: Patient seen and examined.  Status post bronchoscopy.  Resting comfortably in bed.  Objective: Vitals:   07/12/20 0845 07/12/20 0900 07/12/20 0928 07/12/20 1143  BP: 93/68 101/70 103/70 97/61  Pulse: 83 83 81 79  Resp: _0 Temp:  (!) 97.4 F (36.3 C) 98.6 F (37 C) 97.8 F (36.6 C)  TempSrc:    Oral  SpO2: 98% 97% 99% 100%  Weight:      Height:        Intake/Output Summary (Last 24 hours) at 07/12/2020 1358 Last data filed at 07/12/2020 0904 Gross per 24 hour  Intake 790 ml  Output 400 ml  Net 390 ml   Filed Weights   07/09/20 1433 07/10/20 1535  Weight: 68 kg 58.9 kg    Examination:  General exam: Appears calm and comfortable  Respiratory system: Decreased air entry.  Scattered coarse crackles bilaterally.  Normal work of breathing.  Room air Cardiovascular system: S1 & S2 heard, RRR. No JVD, murmurs, rubs, gallops or clicks. No pedal edema. Gastrointestinal system: Abdomen is nondistended, soft and nontender. No organomegaly or masses felt. Normal bowel sounds heard. Central nervous system: Alert and oriented. No focal neurological deficits. Extremities: Markedly decreased power left lower extremity Skin: No rashes, lesions or ulcers Psychiatry: Judgement and insight appear normal. Mood & affect appropriate.     Data Reviewed: I have personally reviewed following labs and imaging studies  CBC: Recent Labs  Lab  07/09/20 1513 07/10/20 0608 07/12/20 0923  WBC 8.6 8.4 10.2  NEUTROABS  --   --  8.3*  HGB 9.9* 10.4* 10.3*  HCT 28.7* 29.1* 29.2*  MCV 97.0 93.9 96.4  PLT 509* 501* 932*   Basic Metabolic Panel: Recent Labs  Lab 07/09/20 1513 07/09/20 2313 07/10/20 0608 07/12/20 0923  NA 133*  --  133* 132*  K 3.0*  --  3.1* 3.3*  CL 98  --  100 98  CO2 25  --  24 25  GLUCOSE 103*  --  90 118*  BUN 26*  --  19 13  CREATININE 1.03  --  0.88 0.65  CALCIUM 9.4  --  9.1 8.8*  MG  --  1.1* 1.8  --   PHOS  --   --  2.0*  --    GFR: Estimated Creatinine Clearance: 76.7 mL/min (by C-G formula based on SCr of 0.65 mg/dL). Liver Function Tests: Recent Labs  Lab 07/09/20 2120  AST 19  ALT 16  ALKPHOS 66  BILITOT 0.5  PROT 7.2  ALBUMIN 3.4*   Recent Labs  Lab 07/09/20 2120  LIPASE 39   No results for input(s): AMMONIA  in the last 168 hours. Coagulation Profile: No results for input(s): INR, PROTIME in the last 168 hours. Cardiac Enzymes: No results for input(s): CKTOTAL, CKMB, CKMBINDEX, TROPONINI in the last 168 hours. BNP (last 3 results) No results for input(s): PROBNP in the last 8760 hours. HbA1C: No results for input(s): HGBA1C in the last 72 hours. CBG: No results for input(s): GLUCAP in the last 168 hours. Lipid Profile: No results for input(s): CHOL, HDL, LDLCALC, TRIG, CHOLHDL, LDLDIRECT in the last 72 hours. Thyroid Function Tests: No results for input(s): TSH, T4TOTAL, FREET4, T3FREE, THYROIDAB in the last 72 hours. Anemia Panel: Recent Labs    07/10/20 0608  VITAMINB12 327  FOLATE 18.4  FERRITIN 384*  TIBC 216*  IRON 42*   Sepsis Labs: No results for input(s): PROCALCITON, LATICACIDVEN in the last 168 hours.  Recent Results (from the past 240 hour(s))  Resp Panel by RT-PCR (Flu A&B, Covid) Nasopharyngeal Swab     Status: None   Collection Time: 07/09/20 11:13 PM   Specimen: Nasopharyngeal Swab; Nasopharyngeal(NP) swabs in vial transport medium  Result  Value Ref Range Status   SARS Coronavirus 2 by RT PCR NEGATIVE NEGATIVE Final    Comment: (NOTE) SARS-CoV-2 target nucleic acids are NOT DETECTED.  The SARS-CoV-2 RNA is generally detectable in upper respiratory specimens during the acute phase of infection. The lowest concentration of SARS-CoV-2 viral copies this assay can detect is 138 copies/mL. A negative result does not preclude SARS-Cov-2 infection and should not be used as the sole basis for treatment or other patient management decisions. A negative result may occur with  improper specimen collection/handling, submission of specimen other than nasopharyngeal swab, presence of viral mutation(s) within the areas targeted by this assay, and inadequate number of viral copies(<138 copies/mL). A negative result must be combined with clinical observations, patient history, and epidemiological information. The expected result is Negative.  Fact Sheet for Patients:  EntrepreneurPulse.com.au  Fact Sheet for Healthcare Providers:  IncredibleEmployment.be  This test is no t yet approved or cleared by the [place] FDA and  has been authorized for detection and/or diagnosis of SARS-CoV-2 by FDA under an Emergency Use Authorization (EUA). This EUA will remain  in effect (meaning this test can be used) for the duration of the COVID-19 declaration under Section 564(b)(1) of the Act, 21 U.S.C.section 360bbb-3(b)(1), unless the authorization is terminated  or revoked sooner.       Influenza A by PCR NEGATIVE NEGATIVE Final   Influenza B by PCR NEGATIVE NEGATIVE Final    Comment: (NOTE) The Xpert Xpress SARS-CoV-2/FLU/RSV plus assay is intended as an aid in the diagnosis of influenza from Nasopharyngeal swab specimens and should not be used as a sole basis for treatment. Nasal washings and aspirates are unacceptable for Xpert Xpress SARS-CoV-2/FLU/RSV testing.  Fact Sheet for  Patients: EntrepreneurPulse.com.au  Fact Sheet for Healthcare Providers: IncredibleEmployment.be  This test is not yet approved or cleared by the [place] FDA and has been authorized for detection and/or diagnosis of SARS-CoV-2 by FDA under an Emergency Use Authorization (EUA). This EUA will remain in effect (meaning this test can be used) for the duration of the COVID-19 declaration under Section 564(b)(1) of the Act, 21 U.S.C. section 360bbb-3(b)(1), unless the authorization is terminated or revoked.  Performed at [facility], [address] [place], [place] [address]          Radiology Studies: MR Lumbar Spine W Wo Contrast  Result Date: 07/11/2020 CLINICAL DATA:  65 year old male  with low back and left lower extremity pain progressed x3 months. Spiculated left lower lobe lung mass in December. EXAM: MRI LUMBAR SPINE WITHOUT AND WITH CONTRAST TECHNIQUE: Multiplanar and multiecho pulse sequences of the lumbar spine were obtained without and with intravenous contrast. CONTRAST:  36m GADAVIST GADOBUTROL 1 MMOL/ML IV SOLN COMPARISON:  CT Chest, Abdomen, and Pelvis 05/14/2020. FINDINGS: Segmentation:  Normal on the December CT. Alignment: Grade 2 spondylolisthesis of L4 on L5, and grade 1 anterolisthesis of L5 on S1 are stable since December. No associated pars fractures. Underlying mild dextroconvex lumbar scoliosis. Vertebrae: Abnormal decreased T1 signal, STIR hyperintensity and patchy enhancement in the left sacral ala partially visible on series 7, image 11. Elsewhere the visible sacrum and left SI joint appear intact. However, left SI joint ankylosis was demonstrated on the prior CT. Subtle semicircular area of increased STIR signal in the left posterolateral L2 vertebral body (series 7, image 9) demonstrates mild enhancement, and a subtle sclerotic lesion was present here in December. However, axial MRI appearance today raises the  possibility of degenerative Schmorl's node (series 8, image 15). The lesion is 16 mm diameter by 8 mm thick. Otherwise normal marrow signal in the visible lower thoracic and lumbar spine. Mild chronic T11 superior endplate compression is stable since December. Conus medullaris and cauda equina: Conus extends to the T12-L1 level. No lower spinal cord or conus signal abnormality. No abnormal intradural enhancement. No dural thickening. Cauda equina nerve roots appear normal. Paraspinal and other soft tissues: Partially visible distended urinary bladder. Trace free fluid in the pelvis. Otherwise negative visible abdominal viscera. Negative lumbar paraspinal soft tissues. Disc levels: Capacious lower thoracic and lumbar spinal canal above L4. L4-L5: Grade 2 spondylolisthesis with advanced disc and posterior element degeneration. Severe facet hypertrophy greater on the right with degenerative facet joint fluid. Moderate spinal stenosis and bilateral lateral recess stenosis. Moderate to severe bilateral L4 foraminal stenosis. L5-S1: Mild anterolisthesis with disc and posterior element degeneration but no spinal stenosis. There is moderate right L5 foraminal stenosis. IMPRESSION: 1. Partially visible abnormal signal in the left sacral ala with patchy enhancement. This does not seem mass-like, and more resembles a sacral insufficiency fracture than metastatic disease (but see also #2). 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 2. Small 5 x 16 mm enhancing lesion in the L2 posterosuperior vertebral body is indeterminate for small bone Metastasis versus Schmorl's node. Follow-up PET-CT might best characterize further. Failing MAC, short interval 2-3 month follow-up MRI would be most valuable. 3. No other metastatic disease identified in the visible lower thoracic or lumbar spine. 4. Degenerative grade 2 spondylolisthesis at L4-L5 with advanced disc and posterior element degeneration, moderate spinal, lateral  recess, and moderate to severe foraminal stenosis. Grade 1 spondylolisthesis at L5-S1 with moderate right foraminal stenosis. Electronically Signed   By: HGenevie AnnM.D.   On: 07/11/2020 08:10   DG C-Arm 1-60 Min-No Report  Result Date: 07/12/2020 Fluoroscopy was utilized by the requesting physician.  No radiographic interpretation.   CT Super D Chest W Contrast  Result Date: 07/11/2020 CLINICAL DATA:  Left lower lobe mass, EBUS planning EXAM: CT CHEST WITH CONTRAST TECHNIQUE: Multidetector CT imaging of the chest was performed using thin slice collimation for electromagnetic bronchoscopy planning purposes, with intravenous contrast. CONTRAST:  747mOMNIPAQUE IOHEXOL 300 MG/ML  SOLN COMPARISON:  CT chest, 05/14/2020 FINDINGS: Cardiovascular: Aortic atherosclerosis. Normal heart size. Three-vessel coronary artery calcifications and/or stents. No pericardial effusion. Mediastinum/Nodes: No enlarged mediastinal, hilar, or axillary lymph  nodes. Thyroid gland, trachea, and esophagus demonstrate no significant findings. Lungs/Pleura: Slight interval enlargement of a large mass of the superior segment left lower lobe, measuring 4.9 x 4.4 cm, previously 4.6 x 3.8 cm when measured similarly (series 3, image 40). There is postobstructive consolidation and nodularity of the dependent left lower lobe (series 3, image 51). Significant interval enlargement of a spiculated appearing nodule of the anterior left upper lobe measuring 1.4 x 1.2 cm, previously 1.1 x 1.0 cm when measured similarly (series 3, image 37). Unchanged small nodule of the superior segment right lower lobe measuring 4 mm (series 3, image 27). Slight interval enlargement of a nodule of the peripheral right upper lobe measuring 3 mm, previously no greater than 1-2 mm (series 3, image 35). Minimal centrilobular emphysema. No pleural effusion or pneumothorax. Upper Abdomen: No acute abnormality. Musculoskeletal: No chest wall mass or suspicious bone lesions  identified. IMPRESSION: 1. Slight interval enlargement of a large mass of the superior segment left lower lobe, measuring 4.9 x 4.4 cm, previously 4.6 x 3.8 cm when measured similarly. There is postobstructive consolidation and nodularity of the dependent left lower lobe. Findings are consistent with primary lung malignancy. 2. Significant interval enlargement of a spiculated appearing nodule of the anterior left upper lobe measuring 1.4 x 1.2 cm, previously 1.1 x 1.0 cm when measured similarly. This is concerning for a synchronous primary versus metastasis. 3. Slight interval enlargement of a nodule of the peripheral right upper lobe measuring 3 mm, previously no greater than 1-2 mm. This is again concerning for synchronous primary or metastasis. 4. Unchanged small nodule of the superior segment right lower lobe measuring 4 mm, nonspecific. 5. No mediastinal lymphadenopathy. 6. Minimal centrilobular emphysema. 7. Coronary artery disease. Aortic Atherosclerosis (ICD10-I70.0) and Emphysema (ICD10-J43.9). Electronically Signed   By: Eddie Candle M.D.   On: 07/11/2020 14:10        Scheduled Meds: . acetaminophen  1,000 mg Oral TID  . buPROPion  150 mg Oral Daily  . dexamethasone  2 mg Oral Q12H  . diclofenac Sodium  2-4 g Topical QID  . enoxaparin (LOVENOX) injection  40 mg Subcutaneous Q24H  . feeding supplement  237 mL Oral TID BM  . gabapentin  400 mg Oral TID  . multivitamin with minerals  1 tablet Oral Daily  . nicotine  14 mg Transdermal Daily  . pantoprazole  40 mg Oral Daily   Continuous Infusions:    LOS: 1 day    Time spent: 15 minutes    Sidney Ace, MD [facility] Pager [phone]  If 7PM-7AM, please contact night-coverage 07/12/2020, 1:58 PM

## 2020-07-12 NOTE — Transfer of Care (Signed)
Immediate Anesthesia Transfer of Care Note  Patient: Kevin Baker  Procedure(s) Performed: VIDEO BRONCHOSCOPY WITH ENDOBRONCHIAL NAVIGATION (Left )  Patient Location: PACU  Anesthesia Type:General  Level of Consciousness: awake, oriented and patient cooperative  Airway & Oxygen Therapy: Patient Spontanous Breathing  Post-op Assessment: Report given to RN and Post -op Vital signs reviewed and stable  Post vital signs: Reviewed and stable  Last Vitals:  Vitals Value Taken Time  BP 108/68 07/12/20 0836  Temp    Pulse 82 07/12/20 0838  Resp 21 07/12/20 0840  SpO2 93 % 07/12/20 0838  Vitals shown include unvalidated device data.  Last Pain:  Vitals:   07/12/20 0612  TempSrc: Oral  PainSc: 4       Patients Stated Pain Goal: 1 (37/36/68 1594)  Complications: No complications documented.

## 2020-07-13 DIAGNOSIS — M25552 Pain in left hip: Secondary | ICD-10-CM | POA: Diagnosis not present

## 2020-07-13 MED ORDER — MORPHINE SULFATE (PF) 2 MG/ML IV SOLN: 2.0000 mg | Freq: Four times a day (QID) | INTRAVENOUS | Status: DC | PRN

## 2020-07-13 MED ORDER — OXYCODONE HCL 5 MG PO TABS
5.0000 mg | ORAL_TABLET | ORAL | Status: DC | PRN
  Administered 2020-07-13 – 2020-07-14 (×4): 10 mg via ORAL
  Administered 2020-07-14: 5 mg via ORAL
  Administered 2020-07-14 – 2020-07-15 (×2): 10 mg via ORAL
  Administered 2020-07-15 (×2): 5 mg via ORAL
  Administered 2020-07-16 (×2): 10 mg via ORAL
  Administered 2020-07-17: 5 mg via ORAL
  Administered 2020-07-18 – 2020-07-19 (×3): 10 mg via ORAL
  Filled 2020-07-13 (×3): qty 2
  Filled 2020-07-13 (×2): qty 1
  Filled 2020-07-13: qty 2
  Filled 2020-07-13: qty 1
  Filled 2020-07-13 (×3): qty 2
  Filled 2020-07-13: qty 1
  Filled 2020-07-13 (×4): qty 2

## 2020-07-13 NOTE — Progress Notes (Signed)
Kevin Baker   DOB:January 23, 1956   YB#:[id number]    Subjective: States his pain is poorly controlled.  Patient had bronchoscopy yesterday.  Denies any hemoptysis.  Objective:  Vitals:   07/13/20 1735 07/13/20 2007  BP: 104/71 (!) 86/68  Pulse: 76 76  Resp: 18 18  Temp: 98.4 F (36.9 C) 97.9 F (36.6 C)  SpO2: 100% 100%     Intake/Output Summary (Last 24 hours) at 07/13/2020 2301 Last data filed at 07/13/2020 1735 Gross per 24 hour  Intake 360 ml  Output 950 ml  Net -590 ml    Physical Exam Constitutional:      Comments: Cachectic appearing Caucasian male patient.  HENT:     Head: Normocephalic and atraumatic.     Mouth/Throat:     Mouth: Oropharynx is clear and moist.     Pharynx: No oropharyngeal exudate.  Eyes:     Pupils: Pupils are equal, round, and reactive to light.  Cardiovascular:     Rate and Rhythm: Normal rate and regular rhythm.  Pulmonary:     Effort: No respiratory distress.     Breath sounds: No wheezing.     Comments: Decreased air entry bilaterally.  Abdominal:     General: Bowel sounds are normal. There is no distension.     Palpations: Abdomen is soft. There is no mass.     Tenderness: There is no abdominal tenderness. There is no guarding or rebound.  Musculoskeletal:        General: No tenderness or edema. Normal range of motion.     Cervical back: Normal range of motion and neck supple.  Skin:    General: Skin is warm.  Neurological:     Mental Status: He is alert and oriented to person, place, and time.  Psychiatric:        Mood and Affect: Affect normal.      Labs:  Lab Results  Component Value Date   WBC 10.2 07/12/2020   HGB 10.3 (L) 07/12/2020   HCT 29.2 (L) 07/12/2020   MCV 96.4 07/12/2020   PLT 509 (H) 07/12/2020   NEUTROABS 8.3 (H) 07/12/2020    Lab Results  Component Value Date   NA 132 (L) 07/12/2020   K 3.3 (L) 07/12/2020   CL 98 07/12/2020   CO2 25 07/12/2020    Studies:  DG C-Arm 1-60 Min-No Report  Result Date:  07/12/2020 Fluoroscopy was utilized by the requesting physician.  No radiographic interpretation.    Mass of lower lobe of left lung 65 year old male patient history of smoking/alcohol is currently in the hospital for worsening left-sided hip pain; noted to have a large left lung mass.  # 4-5 cm left lung lower lobe mass-highly suspicious for malignancy-primary lung cancer; s/p evaluation with pulmonary.  S/p bronchoscopy on 2/25.   #Left hip pain-soft tissue lesions noted in the femoral area likely secondary to metastatic disease status post evaluation with radiation oncology.  Await palliative radiation next week.  Continue narcotic pain medication/steroids-as per primary team/palliative care.    Cammie Sickle, MD 07/13/2020  11:01 PM

## 2020-07-13 NOTE — Progress Notes (Signed)
Pulmonary Medicine          Date: 07/13/2020,   MRN# [id number] Kevin Baker Feb 20, 1956     AdmissionWeight: 68 kg                 CurrentWeight: 58.9 kg      CHIEF COMPLAINT:   Lung mass of LLL 4.3cm    HISTORY OF PRESENT ILLNESS   Kevin Baker is a 65 y.o. 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  Remote posttraumatic changes of the left pubic root, superior and inferior rami and left ilium are unchanged from prior CT.  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  Fluid-filled appearance of the colon also seen.  Patient was given 1 L normal saline and the hospitalist service was consulted to admit for further evaluation and management.  Patient is not using any oxygen at this time.  He stopped smoking last 2 weeks he stopped drinking alcohol 2 monts ago. [name]  He is not coughing.  Pulmonary consultation for tissue diagnosis of LLL mass.  Reviweed with patient he isa greeable to  Biopsy.    07/11/20-  Patient for bronchoscopy and biopsy in am.  07/12/20- patient is evaluated this am prior to procedure. Remains on room air no events overnight. 07/13/20- patient remains on room air resting comfortably in bed. He is s/p procedure with biopsy of Left lower lobe lesion with preliminary findings of carcinoma and final diagnosis in process. Discussed case with neurosurgery  today.                    PAST MEDICAL HISTORY   Past Medical History:  Diagnosis Date  . Alcohol use disorder, severe, dependence ([place])   . Mass of lower lobe of left lung      SURGICAL HISTORY   Past Surgical History:  Procedure Laterality Date  . BACK SURGERY    . LEG SURGERY Right   . PELVIC FRACTURE SURGERY    . VIDEO BRONCHOSCOPY WITH ENDOBRONCHIAL NAVIGATION Left 07/12/2020   Procedure: VIDEO BRONCHOSCOPY WITH ENDOBRONCHIAL NAVIGATION;  Surgeon: Ottie Glazier, MD;  Location: [facility] ORS;  Service: Thoracic;  Laterality: Left;     FAMILY HISTORY   Family History  Problem Relation Age of Onset  . Heart disease Father      SOCIAL HISTORY   Social History   Tobacco Use  . Smoking status: Current Every Day Smoker    Packs/day: 3.00    Types: Cigarettes  . Smokeless tobacco: Never Used  Substance Use Topics  . Alcohol use: Not Currently    Comment: for last 3 weeks, drinking daily   . Drug use: Not Currently     MEDICATIONS    Home Medication:    Current Medication:  Current Facility-Administered Medications:  .  acetaminophen (TYLENOL) tablet 1,000 mg, 1,000 mg, Oral, TID, Sreenath, Sudheer B, MD, 1,000 mg at 07/13/20 1036 .  alum & mag hydroxide-simeth (MAALOX/MYLANTA) 200-200-20 MG/5ML suspension 15 mL, 15 mL, Oral, Q4H PRN, Priscella Mann, Sudheer B, MD, 15 mL at 07/12/20 1845 .  baclofen (LIORESAL) tablet 10 mg, 10 mg, Oral, TID PRN, Zada Finders R, MD .  buPROPion [facility] SR) 12 hr tablet 150 mg, 150 mg, Oral, Daily, Zada Finders R, MD, 150 mg at 07/13/20 1035 .  dexamethasone (DECADRON) tablet 2 mg, 2 mg, Oral, Q12H, Borders, Vonna Kotyk R, NP, 2 mg at 07/13/20 1036 .  diclofenac Sodium (VOLTAREN) 1 % topical gel 2-4 g, 2-4 g, Topical, QID, Zada Finders R, MD, 4 g at 07/13/20 1039 .  enoxaparin (LOVENOX) injection 40 mg, 40 mg, Subcutaneous, Q24H, Sreenath, Sudheer B, MD .  feeding supplement (ENSURE ENLIVE / ENSURE PLUS) liquid 237 mL, 237 mL, Oral,  TID BM, Sreenath, Sudheer B, MD, 237 mL at 07/13/20 1039 .  folic acid (FOLVITE) tablet 1 mg, 1 mg, Oral, Daily, Sreenath, Sudheer B, MD, 1 mg at 07/13/20 1037 .  gabapentin (NEURONTIN) capsule 400 mg, 400 mg, Oral, TID, Sreenath, Sudheer B, MD, 400 mg at 07/13/20 1036 .  hydrOXYzine (VISTARIL) capsule 25 mg, 25 mg, Oral, QID PRN, Posey Pronto, Vishal R, MD .  morphine 2 MG/ML injection 2 mg, 2 mg, Intravenous, Q6H PRN, Sreenath, Sudheer B, MD .  multivitamin with minerals tablet 1 tablet, 1 tablet, Oral, Daily, Sreenath, Sudheer B, MD, 1 tablet at 07/13/20 1036 .  nicotine (NICODERM CQ - dosed in mg/24 hours) patch 14 mg, 14 mg, Transdermal, Daily, Zada Finders R, MD, 14 mg at 07/10/20 0335 .  nicotine polacrilex (NICORETTE) gum 2 mg, 2 mg, Oral, PRN, Priscella Mann, Sudheer B, MD, 2 mg at 07/12/20 1046 .  ondansetron (ZOFRAN) tablet 4 mg, 4 mg, Oral, Q6H PRN, 4 mg at 07/10/20 2114 **OR** ondansetron (ZOFRAN) injection 4 mg, 4 mg, Intravenous, Q6H PRN, Posey Pronto, Vishal R, MD .  oxyCODONE (Oxy IR/ROXICODONE) immediate release tablet 5-10 mg, 5-10 mg, Oral, Q4H PRN, Sreenath, Sudheer B, MD .  pantoprazole (PROTONIX) EC tablet 40 mg, 40 mg, Oral, Daily, Sreenath, Sudheer B, MD, 40 mg at 07/13/20 1036 .  senna-docusate (Senokot-S) tablet 1 tablet, 1 tablet, Oral, QHS PRN, Lenore Cordia, MD .  traZODone (DESYREL) tablet 50 mg, 50 mg, Oral, QHS, Sreenath, Sudheer B, MD, 50 mg at 07/12/20 2131    ALLERGIES   Codeine, Sulfamethoxazole-trimethoprim, and Lactose intolerance (gi)     REVIEW OF SYSTEMS    Review of Systems:  Gen:  Denies  fever, sweats, chills weigh loss  HEENT: Denies blurred vision, double vision, ear pain, eye pain, hearing loss, nose bleeds, sore throat Cardiac:  No dizziness, chest pain or heaviness, chest tightness,edema Resp:   Denies cough or sputum porduction, shortness of breath,wheezing, hemoptysis,  Gi: Denies swallowing difficulty, stomach pain, nausea or vomiting, diarrhea,  constipation, bowel incontinence Gu:  Denies bladder incontinence, burning urine Ext:   Denies Joint pain, stiffness or swelling Skin: Denies  skin rash, easy bruising or bleeding or hives Endoc:  Denies polyuria, polydipsia , polyphagia or weight change Psych:   Denies depression, insomnia or hallucinations   Other:  All other systems negative   VS: BP 110/65 (BP Location: Right Arm)   Pulse 82   Temp 98 F (36.7 C)   Resp 20   Ht 6' (1.829 m)   Wt 58.9 kg   SpO2 100%   BMI 17.61 kg/m      PHYSICAL EXAM    GENERAL:NAD, no fevers, chills, no  weakness no fatigue HEAD: Normocephalic, atraumatic.  EYES: Pupils equal, round, reactive to light. Extraocular muscles intact. No scleral icterus.  MOUTH: Moist mucosal membrane. Dentition intact. No abscess noted.  EAR, NOSE, THROAT: Clear without exudates. No external lesions.  NECK: Supple. No thyromegaly. No nodules. No JVD.  PULMONARY: decreased air entry at LLL, with rhonchi bialterally  CARDIOVASCULAR: S1 and S2. Regular rate and rhythm. No murmurs, rubs, or gallops. No edema. Pedal pulses 2+ bilaterally.  GASTROINTESTINAL: Soft, nontender, nondistended. No masses. Positive bowel sounds. No hepatosplenomegaly.  MUSCULOSKELETAL: No swelling, clubbing, or edema. Range of motion full in all extremities.  NEUROLOGIC: Cranial nerves II through XII are intact. No gross focal neurological deficits. Sensation intact. Reflexes intact.  SKIN: No ulceration, lesions, rashes, or cyanosis. Skin warm and dry. Turgor intact.  PSYCHIATRIC: Mood, affect within normal limits. The patient is awake, alert and oriented x 3. Insight, judgment intact.       IMAGING    MR Lumbar Spine W Wo Contrast  Result Date: 07/11/2020 CLINICAL DATA:  65 year old male with low back and left lower extremity pain progressed x3 months. Spiculated left lower lobe lung mass in December. EXAM: MRI LUMBAR SPINE WITHOUT AND WITH CONTRAST TECHNIQUE: Multiplanar and  multiecho pulse sequences of the lumbar spine were obtained without and with intravenous contrast. CONTRAST:  33m GADAVIST GADOBUTROL 1 MMOL/ML IV SOLN COMPARISON:  CT Chest, Abdomen, and Pelvis 05/14/2020. FINDINGS: Segmentation:  Normal on the December CT. Alignment: Grade 2 spondylolisthesis of L4 on L5, and grade 1 anterolisthesis of L5 on S1 are stable since December. No associated pars fractures. Underlying mild dextroconvex lumbar scoliosis. Vertebrae: Abnormal decreased T1 signal, STIR hyperintensity and patchy enhancement in the left sacral ala partially visible on series 7, image 11. Elsewhere the visible sacrum and left SI joint appear intact. However, left SI joint ankylosis was demonstrated on the prior CT. Subtle semicircular area of increased STIR signal in the left posterolateral L2 vertebral body (series 7, image 9) demonstrates mild enhancement, and a subtle sclerotic lesion was present here in December. However, axial MRI appearance today raises the possibility of degenerative Schmorl's node (series 8, image 15). The lesion is 16 mm diameter by 8 mm thick. Otherwise normal marrow signal in the visible lower thoracic and lumbar spine. Mild chronic T11 superior endplate compression is stable since December. Conus medullaris and cauda equina: Conus extends to the T12-L1 level. No lower spinal cord or conus signal abnormality. No abnormal intradural enhancement. No dural thickening. Cauda equina nerve roots appear normal. Paraspinal and other soft tissues: Partially visible distended urinary bladder. Trace free fluid in the pelvis. Otherwise negative visible abdominal viscera. Negative lumbar paraspinal soft tissues. Disc levels: Capacious lower thoracic and lumbar spinal canal above L4. L4-L5: Grade 2 spondylolisthesis with advanced disc and posterior element degeneration. Severe facet hypertrophy greater on the right with degenerative facet joint fluid. Moderate spinal stenosis and bilateral lateral  recess stenosis. Moderate to severe bilateral L4 foraminal stenosis. L5-S1: Mild anterolisthesis with disc and posterior element degeneration but no spinal stenosis. There is moderate right L5 foraminal stenosis. IMPRESSION: 1. Partially visible abnormal signal in the left sacral ala with patchy enhancement. This does not seem mass-like, and more resembles a sacral insufficiency fracture than metastatic disease (but see also #2). 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 2. Small 5 x 16 mm enhancing lesion in the L2 posterosuperior vertebral body is indeterminate for small bone Metastasis versus Schmorl's node. Follow-up PET-CT might best characterize further.  Failing MAC, short interval 2-3 month follow-up MRI would be most valuable. 3. No other metastatic disease identified in the visible lower thoracic or lumbar spine. 4. Degenerative grade 2 spondylolisthesis at L4-L5 with advanced disc and posterior element degeneration, moderate spinal, lateral recess, and moderate to severe foraminal stenosis. Grade 1 spondylolisthesis at L5-S1 with moderate right foraminal stenosis. Electronically Signed   By: Genevie Ann M.D.   On: 07/11/2020 08:10   CT Hip Left Wo Contrast  Result Date: 07/10/2020 CLINICAL DATA:  Left leg and arm weakness with pain since March 2021, negative radiographs EXAM: CT OF THE LEFT HIP WITHOUT CONTRAST TECHNIQUE: Multidetector CT imaging of the left hip was performed according to the standard protocol. Multiplanar CT image reconstructions were also generated. COMPARISON:  Radiograph 07/09/2020, CT 05/14/2020 FINDINGS: Bones/Joint/Cartilage The osseous structures appear diffusely demineralized which may limit detection of small or nondisplaced fractures. Ovoid lucent lesion centered within the lesser trochanter measuring approximately 2.6 x 2.3 x 3.1 cm in size. No clear associated pathologic fracture is evident at this time. No other suspicious lytic or blastic lesions within the  included margins of imaging. No other acute bony abnormality. Specifically, no fracture, subluxation, or dislocation of the left hip or included bony pelvis. More remote remote posttraumatic changes of the left pubic root, superior and inferior rami, unchanged from comparison CT. Ankylosis left SI joint and likely prior traumatic deformity of the left ilium. Degenerative change at the pubic symphysis and left hip is similar to comparison. No sizable hip effusion. Ligaments Suboptimally assessed by CT. Muscles and Tendons No acute musculotendinous abnormality is seen. Soft tissues Atherosclerotic calcifications in the iliac arteries and proximal common and superficial femoral arteries. Circumferential thickening of the urinary bladder though possibly related to underdistention. Fluid-filled appearance of the colon, correlate for rapid transit state. IMPRESSION: 1. Ovoid lucent lesion centered within the left lesser trochanter measuring approximately 2.6 x 2.3 x 3.1 cm in size. No clear associated pathologic fracture is evident at this time. Findings concerning for metastatic lesion given known left lung lesion. 2. Remote posttraumatic changes of the left pubic root, superior and inferior rami, and left ilium unchanged from comparison CT. 3. Ankylosis left SI joint. 4. Circumferential thickening of the urinary bladder though possibly related to underdistention. Correlate with urinalysis to exclude cystitis. 5. Fluid-filled appearance of the colon, could reflect diarrheal illness/rapid transit state. 6. Atherosclerosis These results were called by telephone at the time of interpretation on 07/10/2020 at 12:23 am to provider Dr. Tamala Julian, who verbally acknowledged these results. Electronically Signed   By: Lovena Le M.D.   On: 07/10/2020 00:23   DG C-Arm 1-60 Min-No Report  Result Date: 07/12/2020 Fluoroscopy was utilized by the requesting physician.  No radiographic interpretation.   DG Hip Unilat W or Wo Pelvis  2-3 Views Left  Result Date: 07/09/2020 CLINICAL DATA:  Acute on chronic hip pain EXAM: DG HIP (WITH OR WITHOUT PELVIS) 2-3V LEFT COMPARISON:  05/13/2020 FINDINGS: Chronic deformity of left iliac bone and pubic rami. No acute displaced fracture or malalignment is seen. There are mild degenerative changes of both hips. Vascular calcifications. IMPRESSION: No acute osseous abnormality. Chronic deformity of left iliac bone and pubic rami. Electronically Signed   By: Donavan Foil M.D.   On: 07/09/2020 21:25   CT Super D Chest W Contrast  Result Date: 07/11/2020 CLINICAL DATA:  Left lower lobe mass, EBUS planning EXAM: CT CHEST WITH CONTRAST TECHNIQUE: Multidetector CT imaging of the chest was performed using  thin slice collimation for electromagnetic bronchoscopy planning purposes, with intravenous contrast. CONTRAST:  91m OMNIPAQUE IOHEXOL 300 MG/ML  SOLN COMPARISON:  CT chest, 05/14/2020 FINDINGS: Cardiovascular: Aortic atherosclerosis. Normal heart size. Three-vessel coronary artery calcifications and/or stents. No pericardial effusion. Mediastinum/Nodes: No enlarged mediastinal, hilar, or axillary lymph nodes. Thyroid gland, trachea, and esophagus demonstrate no significant findings. Lungs/Pleura: Slight interval enlargement of a large mass of the superior segment left lower lobe, measuring 4.9 x 4.4 cm, previously 4.6 x 3.8 cm when measured similarly (series 3, image 40). There is postobstructive consolidation and nodularity of the dependent left lower lobe (series 3, image 51). Significant interval enlargement of a spiculated appearing nodule of the anterior left upper lobe measuring 1.4 x 1.2 cm, previously 1.1 x 1.0 cm when measured similarly (series 3, image 37). Unchanged small nodule of the superior segment right lower lobe measuring 4 mm (series 3, image 27). Slight interval enlargement of a nodule of the peripheral right upper lobe measuring 3 mm, previously no greater than 1-2 mm (series 3, image  35). Minimal centrilobular emphysema. No pleural effusion or pneumothorax. Upper Abdomen: No acute abnormality. Musculoskeletal: No chest wall mass or suspicious bone lesions identified. IMPRESSION: 1. Slight interval enlargement of a large mass of the superior segment left lower lobe, measuring 4.9 x 4.4 cm, previously 4.6 x 3.8 cm when measured similarly. There is postobstructive consolidation and nodularity of the dependent left lower lobe. Findings are consistent with primary lung malignancy. 2. Significant interval enlargement of a spiculated appearing nodule of the anterior left upper lobe measuring 1.4 x 1.2 cm, previously 1.1 x 1.0 cm when measured similarly. This is concerning for a synchronous primary versus metastasis. 3. Slight interval enlargement of a nodule of the peripheral right upper lobe measuring 3 mm, previously no greater than 1-2 mm. This is again concerning for synchronous primary or metastasis. 4. Unchanged small nodule of the superior segment right lower lobe measuring 4 mm, nonspecific. 5. No mediastinal lymphadenopathy. 6. Minimal centrilobular emphysema. 7. Coronary artery disease. Aortic Atherosclerosis (ICD10-I70.0) and Emphysema (ICD10-J43.9). Electronically Signed   By: AEddie CandleM.D.   On: 07/11/2020 14:10      ASSESSMENT/PLAN   4.6 x 3.8 cm spiculated mass is noted in the left lower lobe   - preliminary findings of carcinoma   - final pathology in process   - scheduled for palliative radiation    - oncology and rad/onc on case - appreciate input     Thank you for allowing me to participate in the care of this patient.   Patient/Family are satisfied with care plan and all questions have been answered.  This document was prepared using Dragon voice recognition software and may include unintentional dictation errors.     FOttie Glazier M.D.  Division of [facility]

## 2020-07-13 NOTE — Progress Notes (Signed)
PROGRESS NOTE    Kevin Baker  BTD:[id number] DOB: 1956/05/14 DOA: 07/09/2020 PCP: Patient, No Pcp Per   Brief Narrative:  Patient with history of alcohol use disorder, tobacco use, hepatitis C, chronic gastritis, chronic pain who presents for evaluation of worsening left hip pain and difficulty ambulating.  He was previously seen in the hospital in December 2021 for similar symptoms.  At that time the primary bronchogenic carcinoma was made aware to him and he was recommended to stay for further evaluation and management however during that admission he elected to leave [place].  He was seen by outpatient orthopedics and was given a prescription for Neurontin for left hip pain and a referral to neurology.  Patient is not followed up.  Presented to ED with progressive worsening hip pain.  I have communicated with oncology who agreed to consult on the patient.  Recommendations appreciated.  Per oncology requesting orthopedic evaluation for surgical fixation of a osseous lesion in the left femur.  Palliative care also involved.  Orthopedics consulted, not recommending surgical fixation at this time.  Communicated with medical oncology and pulmonology.  We will plan for bronchoscopy/EBUS for definitive diagnosis on 2/25.  Radiation oncology contacted for palliative radiation.  Patient underwent successful bronchoscopy/transbronchial biopsy on 2/25.  Results will be available in approximately 5 days.  Patient was also seen by neurosurgery at the request orthopedist.  No neurosurgical intervention warranted at this time.  Patient is scheduled for CT mapping and initiation of palliative radiation early next week.  This may present issues with disposition and skilled nursing facility placement.  Pain control appears to be improving.   Assessment & Plan:   Principal Problem:   Acute on chronic pain of left hip Active Problems:   Mass of lower lobe of left lung   Hypokalemia    Hypomagnesemia   Normocytic anemia   Palliative care encounter   Protein-calorie malnutrition, severe   Cancer associated pain  Acute on chronic left hip pain with generalized weakness and frequent falls Degenerative joint disease of the left hip: CT imaging of the left hip concerning for metastatic lesion at the left lesser trochanter.  Also shows remote posttraumatic changes of the left pubic root, superior and inferior rami, left ilium and ankylosis at the left SI joint. -Oncology consult requested -Orthopedic consult requested -Radiation oncology requested Plan: Continue pain control.  Continue therapy evaluations.  Ideally would get patient placed in skilled nursing facility however this may present issues with transport to radiation therapy.  Radiation will likely be more effective for pain control then continuation of narcotics.  For today we will attempt to minimize/discontinue use of IV narcotics.  Left lower lobe lung mass: 4.6 x 3.8 spiculated mass seen at the left lower lobe on CT imaging 05/14/2020 concerning for primary bronchogenic carcinoma.  Has not had outpatient follow-up for this yet.  Discussed findings with patient. -Status post transbronchial biopsy on 2/25.  Appreciate pulmonology involvement.  Results should be available in 5 days.  Alcohol use disorder: Reports he quit drinking about 2 months ago.  Not currently exhibiting any signs of withdrawal.  Continue CIWA checks without Ativan for now.  Normocytic anemia: Without obvious bleeding.   No indication for transfusion Iron indices indicate anemia of chronic disease  Hypokalemia/hypomagnesemia: Monitor replace as necessary  Tobacco use: Reports cutting back to 1.5 PPD per week.  Patient strongly advised to quit smoking completely given likely bronchogenic cancer.  Nicotine patch and lozenges ordered.   DVT  prophylaxis: SQ Lovenox Code Status: Full Family Communication: None today Disposition Plan:  Status is: Inpatient  Remains inpatient appropriate because:Inpatient level of care appropriate due to severity of illness   Dispo: The patient is from: Home              Anticipated d/c is to: SNF              Patient currently is not medically stable to d/c.   Difficult to place patient Yes   Optimizing pain control.  Disposition issue anticipated as patient will likely initiate radiation therapy early next week.  Will need to discuss with oncology, rad, TOC to determine appropriate course of action.    Level of care: Med-Surg  Consultants:   Oncology  Orthopedics  Palliative care  Pulmonology  Procedures:   None  Antimicrobials:   None   Subjective: Patient seen and examined.  Pain control improving.  Tolerating p.o.  Objective: Vitals:   07/12/20 2003 07/12/20 2342 07/13/20 0435 07/13/20 1034  BP: 104/71 (!) 98/56 101/70 114/78  Pulse: 79 74 80 82  Resp: 18 16 16 18   Temp: 98 F (36.7 C) 98.2 F (36.8 C) 97.6 F (36.4 C) 98.2 F (36.8 C)  TempSrc: Oral Oral Oral   SpO2: 99% 97% 100% 100%  Weight:      Height:        Intake/Output Summary (Last 24 hours) at 07/13/2020 1050 Last data filed at 07/13/2020 0900 Gross per 24 hour  Intake 1080 ml  Output 500 ml  Net 580 ml   Filed Weights   07/09/20 1433 07/10/20 1535  Weight: 68 kg 58.9 kg    Examination:  General exam: Appears calm and comfortable  Respiratory system: Decreased air entry.  Scattered coarse crackles bilaterally.  Normal work of breathing.  Room air Cardiovascular system: S1 & S2 heard, RRR. No JVD, murmurs, rubs, gallops or clicks. No pedal edema. Gastrointestinal system: Abdomen is nondistended, soft and nontender. No organomegaly or masses felt. Normal bowel sounds heard. Central nervous system: Alert and oriented. No focal neurological deficits. Extremities: Markedly decreased power left lower extremity Skin: No rashes, lesions or ulcers Psychiatry: Judgement and insight  appear normal. Mood & affect appropriate.     Data Reviewed: I have personally reviewed following labs and imaging studies  CBC: Recent Labs  Lab 07/09/20 1513 07/10/20 0608 07/12/20 0923  WBC 8.6 8.4 10.2  NEUTROABS  --   --  8.3*  HGB 9.9* 10.4* 10.3*  HCT 28.7* 29.1* 29.2*  MCV 97.0 93.9 96.4  PLT 509* 501* 466*   Basic Metabolic Panel: Recent Labs  Lab 07/09/20 1513 07/09/20 2313 07/10/20 0608 07/12/20 0923  NA 133*  --  133* 132*  K 3.0*  --  3.1* 3.3*  CL 98  --  100 98  CO2 25  --  24 25  GLUCOSE 103*  --  90 118*  BUN 26*  --  19 13  CREATININE 1.03  --  0.88 0.65  CALCIUM 9.4  --  9.1 8.8*  MG  --  1.1* 1.8  --   PHOS  --   --  2.0*  --    GFR: Estimated Creatinine Clearance: 76.7 mL/min (by C-G formula based on SCr of 0.65 mg/dL). Liver Function Tests: Recent Labs  Lab 07/09/20 2120  AST 19  ALT 16  ALKPHOS 66  BILITOT 0.5  PROT 7.2  ALBUMIN 3.4*   Recent Labs  Lab 07/09/20 2120  LIPASE  39   No results for input(s): AMMONIA in the last 168 hours. Coagulation Profile: No results for input(s): INR, PROTIME in the last 168 hours. Cardiac Enzymes: No results for input(s): CKTOTAL, CKMB, CKMBINDEX, TROPONINI in the last 168 hours. BNP (last 3 results) No results for input(s): PROBNP in the last 8760 hours. HbA1C: No results for input(s): HGBA1C in the last 72 hours. CBG: No results for input(s): GLUCAP in the last 168 hours. Lipid Profile: No results for input(s): CHOL, HDL, LDLCALC, TRIG, CHOLHDL, LDLDIRECT in the last 72 hours. Thyroid Function Tests: No results for input(s): TSH, T4TOTAL, FREET4, T3FREE, THYROIDAB in the last 72 hours. Anemia Panel: No results for input(s): VITAMINB12, FOLATE, FERRITIN, TIBC, IRON, RETICCTPCT in the last 72 hours. Sepsis Labs: No results for input(s): PROCALCITON, LATICACIDVEN in the last 168 hours.  Recent Results (from the past 240 hour(s))  Resp Panel by RT-PCR (Flu A&B, Covid) Nasopharyngeal Swab      Status: None   Collection Time: 07/09/20 11:13 PM   Specimen: Nasopharyngeal Swab; Nasopharyngeal(NP) swabs in vial transport medium  Result Value Ref Range Status   SARS Coronavirus 2 by RT PCR NEGATIVE NEGATIVE Final    Comment: (NOTE) SARS-CoV-2 target nucleic acids are NOT DETECTED.  The SARS-CoV-2 RNA is generally detectable in upper respiratory specimens during the acute phase of infection. The lowest concentration of SARS-CoV-2 viral copies this assay can detect is 138 copies/mL. A negative result does not preclude SARS-Cov-2 infection and should not be used as the sole basis for treatment or other patient management decisions. A negative result may occur with  improper specimen collection/handling, submission of specimen other than nasopharyngeal swab, presence of viral mutation(s) within the areas targeted by this assay, and inadequate number of viral copies(<138 copies/mL). A negative result must be combined with clinical observations, patient history, and epidemiological information. The expected result is Negative.  Fact Sheet for Patients:  EntrepreneurPulse.com.au  Fact Sheet for Healthcare Providers:  IncredibleEmployment.be  This test is no t yet approved or cleared by the [place] FDA and  has been authorized for detection and/or diagnosis of SARS-CoV-2 by FDA under an Emergency Use Authorization (EUA). This EUA will remain  in effect (meaning this test can be used) for the duration of the COVID-19 declaration under Section 564(b)(1) of the Act, 21 U.S.C.section 360bbb-3(b)(1), unless the authorization is terminated  or revoked sooner.       Influenza A by PCR NEGATIVE NEGATIVE Final   Influenza B by PCR NEGATIVE NEGATIVE Final    Comment: (NOTE) The Xpert Xpress SARS-CoV-2/FLU/RSV plus assay is intended as an aid in the diagnosis of influenza from Nasopharyngeal swab specimens and should not be used as a sole  basis for treatment. Nasal washings and aspirates are unacceptable for Xpert Xpress SARS-CoV-2/FLU/RSV testing.  Fact Sheet for Patients: EntrepreneurPulse.com.au  Fact Sheet for Healthcare Providers: IncredibleEmployment.be  This test is not yet approved or cleared by the [place] FDA and has been authorized for detection and/or diagnosis of SARS-CoV-2 by FDA under an Emergency Use Authorization (EUA). This EUA will remain in effect (meaning this test can be used) for the duration of the COVID-19 declaration under Section 564(b)(1) of the Act, 21 U.S.C. section 360bbb-3(b)(1), unless the authorization is terminated or revoked.  Performed at [facility], [address] [place], [place] [address]          Radiology Studies: DG C-Arm 1-60 Min-No Report  Result Date: 07/12/2020 Fluoroscopy was utilized by the requesting physician.  No radiographic interpretation.   CT Super D Chest W Contrast  Result Date: 07/11/2020 CLINICAL DATA:  Left lower lobe mass, EBUS planning EXAM: CT CHEST WITH CONTRAST TECHNIQUE: Multidetector CT imaging of the chest was performed using thin slice collimation for electromagnetic bronchoscopy planning purposes, with intravenous contrast. CONTRAST:  73mL OMNIPAQUE IOHEXOL 300 MG/ML  SOLN COMPARISON:  CT chest, 05/14/2020 FINDINGS: Cardiovascular: Aortic atherosclerosis. Normal heart size. Three-vessel coronary artery calcifications and/or stents. No pericardial effusion. Mediastinum/Nodes: No enlarged mediastinal, hilar, or axillary lymph nodes. Thyroid gland, trachea, and esophagus demonstrate no significant findings. Lungs/Pleura: Slight interval enlargement of a large mass of the superior segment left lower lobe, measuring 4.9 x 4.4 cm, previously 4.6 x 3.8 cm when measured similarly (series 3, image 40). There is postobstructive consolidation and nodularity of the dependent left lower lobe (series 3,  image 51). Significant interval enlargement of a spiculated appearing nodule of the anterior left upper lobe measuring 1.4 x 1.2 cm, previously 1.1 x 1.0 cm when measured similarly (series 3, image 37). Unchanged small nodule of the superior segment right lower lobe measuring 4 mm (series 3, image 27). Slight interval enlargement of a nodule of the peripheral right upper lobe measuring 3 mm, previously no greater than 1-2 mm (series 3, image 35). Minimal centrilobular emphysema. No pleural effusion or pneumothorax. Upper Abdomen: No acute abnormality. Musculoskeletal: No chest wall mass or suspicious bone lesions identified. IMPRESSION: 1. Slight interval enlargement of a large mass of the superior segment left lower lobe, measuring 4.9 x 4.4 cm, previously 4.6 x 3.8 cm when measured similarly. There is postobstructive consolidation and nodularity of the dependent left lower lobe. Findings are consistent with primary lung malignancy. 2. Significant interval enlargement of a spiculated appearing nodule of the anterior left upper lobe measuring 1.4 x 1.2 cm, previously 1.1 x 1.0 cm when measured similarly. This is concerning for a synchronous primary versus metastasis. 3. Slight interval enlargement of a nodule of the peripheral right upper lobe measuring 3 mm, previously no greater than 1-2 mm. This is again concerning for synchronous primary or metastasis. 4. Unchanged small nodule of the superior segment right lower lobe measuring 4 mm, nonspecific. 5. No mediastinal lymphadenopathy. 6. Minimal centrilobular emphysema. 7. Coronary artery disease. Aortic Atherosclerosis (ICD10-I70.0) and Emphysema (ICD10-J43.9). Electronically Signed   By: Eddie Candle M.D.   On: 07/11/2020 14:10        Scheduled Meds: . acetaminophen  1,000 mg Oral TID  . buPROPion  150 mg Oral Daily  . dexamethasone  2 mg Oral Q12H  . diclofenac Sodium  2-4 g Topical QID  . enoxaparin (LOVENOX) injection  40 mg Subcutaneous Q24H  .  feeding supplement  237 mL Oral TID BM  . folic acid  1 mg Oral Daily  . gabapentin  400 mg Oral TID  . multivitamin with minerals  1 tablet Oral Daily  . nicotine  14 mg Transdermal Daily  . pantoprazole  40 mg Oral Daily  . traZODone  50 mg Oral QHS   Continuous Infusions:    LOS: 2 days    Time spent: 15 minutes    Sidney Ace, MD [facility] Pager [phone]  If 7PM-7AM, please contact night-coverage 07/13/2020, 10:50 AM

## 2020-07-13 NOTE — Plan of Care (Signed)
  Problem: Activity: Goal: Risk for activity intolerance will decrease Outcome: Progressing   Problem: Nutrition: Goal: Adequate nutrition will be maintained Outcome: Progressing   Problem: Elimination: Goal: Will not experience complications related to bowel motility Outcome: Progressing Goal: Will not experience complications related to urinary retention Outcome: Progressing   Problem: Coping: Goal: Level of anxiety will decrease Outcome: Progressing

## 2020-07-14 LAB — CBC WITH DIFFERENTIAL/PLATELET
Abs Immature Granulocytes: 0.1 10*3/uL — ABNORMAL HIGH (ref 0.00–0.07)
Basophils Absolute: 0 10*3/uL (ref 0.0–0.1)
Basophils Relative: 0 %
Eosinophils Absolute: 0 10*3/uL (ref 0.0–0.5)
Eosinophils Relative: 0 %
HCT: 30.6 % — ABNORMAL LOW (ref 39.0–52.0)
Hemoglobin: 10.7 g/dL — ABNORMAL LOW (ref 13.0–17.0)
Immature Granulocytes: 1 %
Lymphocytes Relative: 19 %
Lymphs Abs: 1.7 10*3/uL (ref 0.7–4.0)
MCH: 34.2 pg — ABNORMAL HIGH (ref 26.0–34.0)
MCHC: 35 g/dL (ref 30.0–36.0)
MCV: 97.8 fL (ref 80.0–100.0)
Monocytes Absolute: 0.7 10*3/uL (ref 0.1–1.0)
Monocytes Relative: 8 %
Neutro Abs: 6.5 10*3/uL (ref 1.7–7.7)
Neutrophils Relative %: 72 %
Platelets: 506 10*3/uL — ABNORMAL HIGH (ref 150–400)
RBC: 3.13 MIL/uL — ABNORMAL LOW (ref 4.22–5.81)
RDW: 13.1 % (ref 11.5–15.5)
WBC: 9 10*3/uL (ref 4.0–10.5)
nRBC: 0 % (ref 0.0–0.2)

## 2020-07-14 LAB — BASIC METABOLIC PANEL
Anion gap: 8 (ref 5–15)
BUN: 12 mg/dL (ref 8–23)
CO2: 31 mmol/L (ref 22–32)
Calcium: 8.8 mg/dL — ABNORMAL LOW (ref 8.9–10.3)
Chloride: 94 mmol/L — ABNORMAL LOW (ref 98–111)
Creatinine, Ser: 0.53 mg/dL — ABNORMAL LOW (ref 0.61–1.24)
GFR, Estimated: >60 mL/min (ref >60)
Glucose, Bld: 105 mg/dL — ABNORMAL HIGH (ref 70–99)
Potassium: 3.9 mmol/L (ref 3.5–5.1)
Sodium: 133 mmol/L — ABNORMAL LOW (ref 135–145)

## 2020-07-14 MED ORDER — MORPHINE SULFATE (PF) 2 MG/ML IV SOLN: 2.0000 mg | Freq: Three times a day (TID) | INTRAVENOUS | Status: DC | PRN

## 2020-07-14 NOTE — Progress Notes (Signed)
Pulmonary Medicine          Date: 07/14/2020,   MRN# [id number] Kevin Baker 19-Nov-1955     AdmissionWeight: 68 kg                 CurrentWeight: 58.9 kg      CHIEF COMPLAINT:   Lung mass of LLL 4.3cm    HISTORY OF PRESENT ILLNESS   Kevin Baker is a 65 y.o. 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  Remote posttraumatic changes of the left pubic root, superior and inferior rami and left ilium are unchanged from prior CT.  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  Fluid-filled appearance of the colon also seen.  Patient was given 1 L normal saline and the hospitalist service was consulted to admit for further evaluation and management.  Patient is not using any oxygen at this time.  He stopped smoking last 2 weeks he stopped drinking alcohol 2 monts ago. [name]  He is not coughing.  Pulmonary consultation for tissue diagnosis of LLL mass.  Reviweed with patient he isa greeable to  Biopsy.    07/11/20-  Patient for bronchoscopy and biopsy in am.  07/12/20- patient is evaluated this am prior to procedure. Remains on room air no events overnight. 07/13/20- patient remains on room air resting comfortably in bed. He is s/p procedure with biopsy of Left lower lobe lesion with preliminary findings of carcinoma and final diagnosis in process. Discussed case with neurosurgery  today.  07/14/20- pt stable no overnight events, will follow peripherally and see in clinic on outpatient.  Reviewed patient with attending physician Dr Priscella Mann.                    PAST MEDICAL HISTORY   Past Medical History:  Diagnosis Date  . Alcohol use disorder, severe, dependence ([place])   . Mass of lower lobe of left lung      SURGICAL HISTORY   Past Surgical History:  Procedure Laterality Date  . BACK SURGERY    . LEG SURGERY Right   . PELVIC FRACTURE SURGERY    . VIDEO BRONCHOSCOPY WITH ENDOBRONCHIAL NAVIGATION Left 07/12/2020   Procedure: VIDEO BRONCHOSCOPY WITH ENDOBRONCHIAL NAVIGATION;  Surgeon: Ottie Glazier, MD;  Location: [facility] ORS;  Service: Thoracic;  Laterality: Left;     FAMILY HISTORY   Family History  Problem Relation Age of Onset  . Heart disease Father      SOCIAL HISTORY   Social History   Tobacco Use  . Smoking status: Current Every Day Smoker    Packs/day: 3.00    Types: Cigarettes  . Smokeless tobacco: Never Used  Substance Use Topics  . Alcohol use: Not Currently    Comment: for last 3 weeks, drinking daily   . Drug use: Not Currently     MEDICATIONS    Home Medication:    Current Medication:  Current Facility-Administered  Medications:  .  acetaminophen (TYLENOL) tablet 1,000 mg, 1,000 mg, Oral, TID, Priscella Mann, Sudheer B, MD, 1,000 mg at 07/14/20 0910 .  alum & mag hydroxide-simeth (MAALOX/MYLANTA) 200-200-20 MG/5ML suspension 15 mL, 15 mL, Oral, Q4H PRN, Priscella Mann, Sudheer B, MD, 15 mL at 07/12/20 1845 .  baclofen (LIORESAL) tablet 10 mg, 10 mg, Oral, TID PRN, Zada Finders R, MD .  buPROPion [facility] SR) 12 hr tablet 150 mg, 150 mg, Oral, Daily, Zada Finders R, MD, 150 mg at 07/14/20 0910 .  dexamethasone (DECADRON) tablet 2 mg, 2 mg, Oral, Q12H, Borders, Kirt Boys, NP, 2 mg at 07/14/20 0910 .  diclofenac Sodium (VOLTAREN) 1 % topical gel 2-4 g, 2-4 g, Topical, QID, Zada Finders R, MD, 4 g at 07/14/20 0913 .  enoxaparin  (LOVENOX) injection 40 mg, 40 mg, Subcutaneous, Q24H, Sreenath, Sudheer B, MD .  feeding supplement (ENSURE ENLIVE / ENSURE PLUS) liquid 237 mL, 237 mL, Oral, TID BM, Sreenath, Sudheer B, MD, 237 mL at 07/14/20 0914 .  folic acid (FOLVITE) tablet 1 mg, 1 mg, Oral, Daily, Sreenath, Sudheer B, MD, 1 mg at 07/14/20 0910 .  gabapentin (NEURONTIN) capsule 400 mg, 400 mg, Oral, TID, Sreenath, Sudheer B, MD, 400 mg at 07/14/20 0910 .  hydrOXYzine (VISTARIL) capsule 25 mg, 25 mg, Oral, QID PRN, Zada Finders R, MD .  morphine 2 MG/ML injection 2 mg, 2 mg, Intravenous, Q8H PRN, Sreenath, Sudheer B, MD .  multivitamin with minerals tablet 1 tablet, 1 tablet, Oral, Daily, Sreenath, Sudheer B, MD, 1 tablet at 07/14/20 0914 .  nicotine (NICODERM CQ - dosed in mg/24 hours) patch 14 mg, 14 mg, Transdermal, Daily, Zada Finders R, MD, 14 mg at 07/10/20 0335 .  nicotine polacrilex (NICORETTE) gum 2 mg, 2 mg, Oral, PRN, Priscella Mann, Sudheer B, MD, 2 mg at 07/12/20 1046 .  ondansetron (ZOFRAN) tablet 4 mg, 4 mg, Oral, Q6H PRN, 4 mg at 07/10/20 2114 **OR** ondansetron (ZOFRAN) injection 4 mg, 4 mg, Intravenous, Q6H PRN, Posey Pronto, Vishal R, MD .  oxyCODONE (Oxy IR/ROXICODONE) immediate release tablet 5-10 mg, 5-10 mg, Oral, Q4H PRN, Priscella Mann, Sudheer B, MD, 10 mg at 07/14/20 1000 .  pantoprazole (PROTONIX) EC tablet 40 mg, 40 mg, Oral, Daily, Sreenath, Sudheer B, MD, 40 mg at 07/14/20 0910 .  senna-docusate (Senokot-S) tablet 1 tablet, 1 tablet, Oral, QHS PRN, Lenore Cordia, MD .  traZODone (DESYREL) tablet 50 mg, 50 mg, Oral, QHS, Sreenath, Sudheer B, MD, 50 mg at 07/13/20 2137    ALLERGIES   Codeine, Sulfamethoxazole-trimethoprim, and Lactose intolerance (gi)     REVIEW OF SYSTEMS    Review of Systems:  Gen:  Denies  fever, sweats, chills weigh loss  HEENT: Denies blurred vision, double vision, ear pain, eye pain, hearing loss, nose bleeds, sore throat Cardiac:  No dizziness, chest pain or heaviness, chest  tightness,edema Resp:   Denies cough or sputum porduction, shortness of breath,wheezing, hemoptysis,  Gi: Denies swallowing difficulty, stomach pain, nausea or vomiting, diarrhea, constipation, bowel incontinence Gu:  Denies bladder incontinence, burning urine Ext:   Denies Joint pain, stiffness or swelling Skin: Denies  skin rash, easy bruising or bleeding or hives Endoc:  Denies polyuria, polydipsia , polyphagia or weight change Psych:   Denies depression, insomnia or hallucinations   Other:  All other systems negative   VS: BP 99/62 (BP Location: Right Arm)   Pulse 81   Temp 98 F (36.7 C) (Oral)   Resp 18   Ht 6' (1.829  m)   Wt 58.9 kg   SpO2 100%   BMI 17.61 kg/m      PHYSICAL EXAM    GENERAL:NAD, no fevers, chills, no weakness no fatigue HEAD: Normocephalic, atraumatic.  EYES: Pupils equal, round, reactive to light. Extraocular muscles intact. No scleral icterus.  MOUTH: Moist mucosal membrane. Dentition intact. No abscess noted.  EAR, NOSE, THROAT: Clear without exudates. No external lesions.  NECK: Supple. No thyromegaly. No nodules. No JVD.  PULMONARY: decreased air entry at LLL, with rhonchi bialterally  CARDIOVASCULAR: S1 and S2. Regular rate and rhythm. No murmurs, rubs, or gallops. No edema. Pedal pulses 2+ bilaterally.  GASTROINTESTINAL: Soft, nontender, nondistended. No masses. Positive bowel sounds. No hepatosplenomegaly.  MUSCULOSKELETAL: No swelling, clubbing, or edema. Range of motion full in all extremities.  NEUROLOGIC: Cranial nerves II through XII are intact. No gross focal neurological deficits. Sensation intact. Reflexes intact.  SKIN: No ulceration, lesions, rashes, or cyanosis. Skin warm and dry. Turgor intact.  PSYCHIATRIC: Mood, affect within normal limits. The patient is awake, alert and oriented x 3. Insight, judgment intact.       IMAGING    MR Lumbar Spine W Wo Contrast  Result Date: 07/11/2020 CLINICAL DATA:  65 year old male with  low back and left lower extremity pain progressed x3 months. Spiculated left lower lobe lung mass in December. EXAM: MRI LUMBAR SPINE WITHOUT AND WITH CONTRAST TECHNIQUE: Multiplanar and multiecho pulse sequences of the lumbar spine were obtained without and with intravenous contrast. CONTRAST:  43m GADAVIST GADOBUTROL 1 MMOL/ML IV SOLN COMPARISON:  CT Chest, Abdomen, and Pelvis 05/14/2020. FINDINGS: Segmentation:  Normal on the December CT. Alignment: Grade 2 spondylolisthesis of L4 on L5, and grade 1 anterolisthesis of L5 on S1 are stable since December. No associated pars fractures. Underlying mild dextroconvex lumbar scoliosis. Vertebrae: Abnormal decreased T1 signal, STIR hyperintensity and patchy enhancement in the left sacral ala partially visible on series 7, image 11. Elsewhere the visible sacrum and left SI joint appear intact. However, left SI joint ankylosis was demonstrated on the prior CT. Subtle semicircular area of increased STIR signal in the left posterolateral L2 vertebral body (series 7, image 9) demonstrates mild enhancement, and a subtle sclerotic lesion was present here in December. However, axial MRI appearance today raises the possibility of degenerative Schmorl's node (series 8, image 15). The lesion is 16 mm diameter by 8 mm thick. Otherwise normal marrow signal in the visible lower thoracic and lumbar spine. Mild chronic T11 superior endplate compression is stable since December. Conus medullaris and cauda equina: Conus extends to the T12-L1 level. No lower spinal cord or conus signal abnormality. No abnormal intradural enhancement. No dural thickening. Cauda equina nerve roots appear normal. Paraspinal and other soft tissues: Partially visible distended urinary bladder. Trace free fluid in the pelvis. Otherwise negative visible abdominal viscera. Negative lumbar paraspinal soft tissues. Disc levels: Capacious lower thoracic and lumbar spinal canal above L4. L4-L5: Grade 2  spondylolisthesis with advanced disc and posterior element degeneration. Severe facet hypertrophy greater on the right with degenerative facet joint fluid. Moderate spinal stenosis and bilateral lateral recess stenosis. Moderate to severe bilateral L4 foraminal stenosis. L5-S1: Mild anterolisthesis with disc and posterior element degeneration but no spinal stenosis. There is moderate right L5 foraminal stenosis. IMPRESSION: 1. Partially visible abnormal signal in the left sacral ala with patchy enhancement. This does not seem mass-like, and more resembles a sacral insufficiency fracture than metastatic disease (but see also #2). There is underlying chronic left SI joint  ankylosis. 2. Small 5 x 16 mm enhancing lesion in the L2 posterosuperior vertebral body is indeterminate for small bone Metastasis versus Schmorl's node. Follow-up PET-CT might best characterize further. Failing MAC, short interval 2-3 month follow-up MRI would be most valuable. 3. No other metastatic disease identified in the visible lower thoracic or lumbar spine. 4. Degenerative grade 2 spondylolisthesis at L4-L5 with advanced disc and posterior element degeneration, moderate spinal, lateral recess, and moderate to severe foraminal stenosis. Grade 1 spondylolisthesis at L5-S1 with moderate right foraminal stenosis. Electronically Signed   By: Genevie Ann M.D.   On: 07/11/2020 08:10   CT Hip Left Wo Contrast  Result Date: 07/10/2020 CLINICAL DATA:  Left leg and arm weakness with pain since March 2021, negative radiographs EXAM: CT OF THE LEFT HIP WITHOUT CONTRAST TECHNIQUE: Multidetector CT imaging of the left hip was performed according to the standard protocol. Multiplanar CT image reconstructions were also generated. COMPARISON:  Radiograph 07/09/2020, CT 05/14/2020 FINDINGS: Bones/Joint/Cartilage The osseous structures appear diffusely demineralized which may limit detection of small or nondisplaced fractures. Ovoid lucent lesion centered  within the lesser trochanter measuring approximately 2.6 x 2.3 x 3.1 cm in size. No clear associated pathologic fracture is evident at this time. No other suspicious lytic or blastic lesions within the included margins of imaging. No other acute bony abnormality. Specifically, no fracture, subluxation, or dislocation of the left hip or included bony pelvis. More remote remote posttraumatic changes of the left pubic root, superior and inferior rami, unchanged from comparison CT. Ankylosis left SI joint and likely prior traumatic deformity of the left ilium. Degenerative change at the pubic symphysis and left hip is similar to comparison. No sizable hip effusion. Ligaments Suboptimally assessed by CT. Muscles and Tendons No acute musculotendinous abnormality is seen. Soft tissues Atherosclerotic calcifications in the iliac arteries and proximal common and superficial femoral arteries. Circumferential thickening of the urinary bladder though possibly related to underdistention. Fluid-filled appearance of the colon, correlate for rapid transit state. IMPRESSION: 1. Ovoid lucent lesion centered within the left lesser trochanter measuring approximately 2.6 x 2.3 x 3.1 cm in size. No clear associated pathologic fracture is evident at this time. Findings concerning for metastatic lesion given known left lung lesion. 2. Remote posttraumatic changes of the left pubic root, superior and inferior rami, and left ilium unchanged from comparison CT. 3. Ankylosis left SI joint. 4. Circumferential thickening of the urinary bladder though possibly related to underdistention. Correlate with urinalysis to exclude cystitis. 5. Fluid-filled appearance of the colon, could reflect diarrheal illness/rapid transit state. 6. Atherosclerosis These results were called by telephone at the time of interpretation on 07/10/2020 at 12:23 am to provider Dr. Tamala Julian, who verbally acknowledged these results. Electronically Signed   By: Lovena Le M.D.    On: 07/10/2020 00:23   DG C-Arm 1-60 Min-No Report  Result Date: 07/12/2020 Fluoroscopy was utilized by the requesting physician.  No radiographic interpretation.   DG Hip Unilat W or Wo Pelvis 2-3 Views Left  Result Date: 07/09/2020 CLINICAL DATA:  Acute on chronic hip pain EXAM: DG HIP (WITH OR WITHOUT PELVIS) 2-3V LEFT COMPARISON:  05/13/2020 FINDINGS: Chronic deformity of left iliac bone and pubic rami. No acute displaced fracture or malalignment is seen. There are mild degenerative changes of both hips. Vascular calcifications. IMPRESSION: No acute osseous abnormality. Chronic deformity of left iliac bone and pubic rami. Electronically Signed   By: Donavan Foil M.D.   On: 07/09/2020 21:25   CT Super D Chest  W Contrast  Result Date: 07/11/2020 CLINICAL DATA:  Left lower lobe mass, EBUS planning EXAM: CT CHEST WITH CONTRAST TECHNIQUE: Multidetector CT imaging of the chest was performed using thin slice collimation for electromagnetic bronchoscopy planning purposes, with intravenous contrast. CONTRAST:  79m OMNIPAQUE IOHEXOL 300 MG/ML  SOLN COMPARISON:  CT chest, 05/14/2020 FINDINGS: Cardiovascular: Aortic atherosclerosis. Normal heart size. Three-vessel coronary artery calcifications and/or stents. No pericardial effusion. Mediastinum/Nodes: No enlarged mediastinal, hilar, or axillary lymph nodes. Thyroid gland, trachea, and esophagus demonstrate no significant findings. Lungs/Pleura: Slight interval enlargement of a large mass of the superior segment left lower lobe, measuring 4.9 x 4.4 cm, previously 4.6 x 3.8 cm when measured similarly (series 3, image 40). There is postobstructive consolidation and nodularity of the dependent left lower lobe (series 3, image 51). Significant interval enlargement of a spiculated appearing nodule of the anterior left upper lobe measuring 1.4 x 1.2 cm, previously 1.1 x 1.0 cm when measured similarly (series 3, image 37). Unchanged small nodule of the superior  segment right lower lobe measuring 4 mm (series 3, image 27). Slight interval enlargement of a nodule of the peripheral right upper lobe measuring 3 mm, previously no greater than 1-2 mm (series 3, image 35). Minimal centrilobular emphysema. No pleural effusion or pneumothorax. Upper Abdomen: No acute abnormality. Musculoskeletal: No chest wall mass or suspicious bone lesions identified. IMPRESSION: 1. Slight interval enlargement of a large mass of the superior segment left lower lobe, measuring 4.9 x 4.4 cm, previously 4.6 x 3.8 cm when measured similarly. There is postobstructive consolidation and nodularity of the dependent left lower lobe. Findings are consistent with primary lung malignancy. 2. Significant interval enlargement of a spiculated appearing nodule of the anterior left upper lobe measuring 1.4 x 1.2 cm, previously 1.1 x 1.0 cm when measured similarly. This is concerning for a synchronous primary versus metastasis. 3. Slight interval enlargement of a nodule of the peripheral right upper lobe measuring 3 mm, previously no greater than 1-2 mm. This is again concerning for synchronous primary or metastasis. 4. Unchanged small nodule of the superior segment right lower lobe measuring 4 mm, nonspecific. 5. No mediastinal lymphadenopathy. 6. Minimal centrilobular emphysema. 7. Coronary artery disease. Aortic Atherosclerosis (ICD10-I70.0) and Emphysema (ICD10-J43.9). Electronically Signed   By: AEddie CandleM.D.   On: 07/11/2020 14:10      ASSESSMENT/PLAN   4.6 x 3.8 cm spiculated mass is noted in the left lower lobe   - preliminary findings of carcinoma   - final pathology in process   - scheduled for palliative radiation    - oncology and rad/onc on case - appreciate input     Thank you for allowing me to participate in the care of this patient.   Patient/Family are satisfied with care plan and all questions have been answered.  This document was prepared using Dragon voice recognition  software and may include unintentional dictation errors.     FOttie Glazier M.D.  Division of [facility]

## 2020-07-14 NOTE — Plan of Care (Signed)

## 2020-07-14 NOTE — Progress Notes (Signed)
PROGRESS NOTE    Kevin Baker  YCX:[id number] DOB: [birth year] DOA: 07/09/2020 PCP: Patient, No Pcp Per   Brief Narrative:  Patient with history of alcohol use disorder, tobacco use, hepatitis C, chronic gastritis, chronic pain who presents for evaluation of worsening left hip pain and difficulty ambulating.  He was previously seen in the hospital in December 2021 for similar symptoms.  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  He was seen by outpatient orthopedics and was given a prescription for Neurontin for left hip pain and a referral to neurology.  Patient is not followed up.  Presented to ED with progressive worsening hip pain.  I have communicated with oncology who agreed to consult on the patient.  Recommendations appreciated.  Per oncology requesting orthopedic evaluation for surgical fixation of a osseous lesion in the left femur.  Palliative care also involved.  Orthopedics consulted, not recommending surgical fixation at this time.  Communicated with medical oncology and pulmonology.  We will plan for bronchoscopy/EBUS for definitive diagnosis on 2/25.  Radiation oncology contacted for palliative radiation.  Patient underwent successful bronchoscopy/transbronchial biopsy on 2/25.  Results will be available in approximately 5 days.  Patient was also seen by neurosurgery at the request orthopedist.  No neurosurgical intervention warranted at this time.  Patient is scheduled for CT mapping and initiation of palliative radiation early next week.  This may present issues with disposition and skilled nursing facility placement.  Pain control appears to be improving.   Assessment & Plan:   Principal Problem:   Acute on chronic pain of left hip Active Problems:   Mass of lower lobe of left lung   Hypokalemia    Hypomagnesemia   Normocytic anemia   Palliative care encounter   Protein-calorie malnutrition, severe   Cancer associated pain  Acute on chronic left hip pain with generalized weakness and frequent falls Degenerative joint disease of the left hip: CT imaging of the left hip concerning for metastatic lesion at the left lesser trochanter.  Also shows remote posttraumatic changes of the left pubic root, superior and inferior rami, left ilium and ankylosis at the left SI joint. -Oncology consult requested -Orthopedic consult requested -Radiation oncology requested Plan: Continue multimodal pain control Up and ambulate with physical therapy Planning for CT simulation tomorrow 2/28 Per oncology consult patient can be transported from the hospital We will need to discuss with [facility] regarding disposition as patient may benefit from skilled nursing facility at time of discharge but this may present disposition issues if he is to receive radiation therapy   Left lower lobe lung mass: 4.6 x 3.8 spiculated mass seen at the left lower lobe on CT imaging 05/14/2020 concerning for primary bronchogenic carcinoma.  Has not had outpatient follow-up for this yet.  Discussed findings with patient. -Status post transbronchial biopsy on 2/25.  Appreciate pulmonology involvement.  Results should be available in 5 days.  Alcohol use disorder: Reports he quit drinking about 2 months ago.  Not currently exhibiting any signs of withdrawal.  Continue CIWA checks without Ativan for now.  Normocytic anemia: Without obvious bleeding.   No indication for transfusion Iron indices indicate anemia of chronic disease  Hypokalemia/hypomagnesemia: Monitor replace as necessary  Tobacco use: Reports cutting back to 1.5 PPD per week.  Patient strongly advised to quit smoking completely given likely bronchogenic cancer.  Nicotine patch and lozenges ordered.   DVT prophylaxis: SQ Lovenox Code Status: Full Family  Communication: None today Disposition Plan: Status is: Inpatient  Remains inpatient appropriate because:Inpatient level of care appropriate due to severity of illness   Dispo: The patient is from: Home              Anticipated d/c is to: SNF              Patient currently is not medically stable to d/c.   Difficult to place patient Yes   Pain control appears to be improving.  Will continue to taper IV pain medication.  Therapy services are recommended skilled nursing facility.  Will need to discuss with TOC regarding disposition appropriateness this patient will likely need to be transported to radiation therapy.    Level of care: Med-Surg  Consultants:   Oncology  Orthopedics  Palliative care  Pulmonology  Procedures:   None  Antimicrobials:   None   Subjective: Patient seen and examined.  Improved pain control.  Tolerating p.o. intake.  Objective: Vitals:   07/14/20 0006 07/14/20 0345 07/14/20 0757 07/14/20 1230  BP: 102/63 102/75 105/70 99/62  Pulse: 79 78 81 81  Resp: 17 17 18 18   Temp: 98.6 F (37 C) 98.1 F (36.7 C) 97.7 F (36.5 C) 98 F (36.7 C)  TempSrc:    Oral  SpO2: 97% 99% 98% 100%  Weight:      Height:        Intake/Output Summary (Last 24 hours) at 07/14/2020 1329 Last data filed at 07/14/2020 1231 Gross per 24 hour  Intake --  Output 1900 ml  Net -1900 ml   Filed Weights   07/09/20 1433 07/10/20 1535  Weight: 68 kg 58.9 kg    Examination:  General exam: Appears calm and comfortable  Respiratory system: Decreased air entry.  Scattered coarse crackles bilaterally.  Normal work of breathing.  Room air Cardiovascular system: S1 & S2 heard, RRR. No JVD, murmurs, rubs, gallops or clicks. No pedal edema. Gastrointestinal system: Abdomen is nondistended, soft and nontender. No organomegaly or masses felt. Normal bowel sounds heard. Central nervous system: Alert and oriented. No focal neurological deficits. Extremities: Markedly  decreased power left lower extremity Skin: No rashes, lesions or ulcers Psychiatry: Judgement and insight appear normal. Mood & affect appropriate.     Data Reviewed: I have personally reviewed following labs and imaging studies  CBC: Recent Labs  Lab 07/09/20 1513 07/10/20 0608 07/12/20 0923 07/14/20 0743  WBC 8.6 8.4 10.2 9.0  NEUTROABS  --   --  8.3* 6.5  HGB 9.9* 10.4* 10.3* 10.7*  HCT 28.7* 29.1* 29.2* 30.6*  MCV 97.0 93.9 96.4 97.8  PLT 509* 501* 509* 725*   Basic Metabolic Panel: Recent Labs  Lab 07/09/20 1513 07/09/20 2313 07/10/20 0608 07/12/20 0923 07/14/20 0743  NA 133*  --  133* 132* 133*  K 3.0*  --  3.1* 3.3* 3.9  CL 98  --  100 98 94*  CO2 25  --  24 25 31   GLUCOSE 103*  --  90 118* 105*  BUN 26*  --  19 13 12   CREATININE 1.03  --  0.88 0.65 0.53*  CALCIUM 9.4  --  9.1 8.8* 8.8*  MG  --  1.1* 1.8  --   --   PHOS  --   --  2.0*  --   --    GFR: Estimated Creatinine Clearance: 76.7 mL/min (A) (by C-G formula  based on SCr of 0.53 mg/dL (L)). Liver Function Tests: Recent Labs  Lab 07/09/20 2120  AST 19  ALT 16  ALKPHOS 66  BILITOT 0.5  PROT 7.2  ALBUMIN 3.4*   Recent Labs  Lab 07/09/20 2120  LIPASE 39   No results for input(s): AMMONIA in the last 168 hours. Coagulation Profile: No results for input(s): INR, PROTIME in the last 168 hours. Cardiac Enzymes: No results for input(s): CKTOTAL, CKMB, CKMBINDEX, TROPONINI in the last 168 hours. BNP (last 3 results) No results for input(s): PROBNP in the last 8760 hours. HbA1C: No results for input(s): HGBA1C in the last 72 hours. CBG: No results for input(s): GLUCAP in the last 168 hours. Lipid Profile: No results for input(s): CHOL, HDL, LDLCALC, TRIG, CHOLHDL, LDLDIRECT in the last 72 hours. Thyroid Function Tests: No results for input(s): TSH, T4TOTAL, FREET4, T3FREE, THYROIDAB in the last 72 hours. Anemia Panel: No results for input(s): VITAMINB12, FOLATE, FERRITIN, TIBC, IRON,  RETICCTPCT in the last 72 hours. Sepsis Labs: No results for input(s): PROCALCITON, LATICACIDVEN in the last 168 hours.  Recent Results (from the past 240 hour(s))  Resp Panel by RT-PCR (Flu A&B, Covid) Nasopharyngeal Swab     Status: None   Collection Time: 07/09/20 11:13 PM   Specimen: Nasopharyngeal Swab; Nasopharyngeal(NP) swabs in vial transport medium  Result Value Ref Range Status   SARS Coronavirus 2 by RT PCR NEGATIVE NEGATIVE Final    Comment: (NOTE) SARS-CoV-2 target nucleic acids are NOT DETECTED.  The SARS-CoV-2 RNA is generally detectable in upper respiratory specimens during the acute phase of infection. The lowest concentration of SARS-CoV-2 viral copies this assay can detect is 138 copies/mL. A negative result does not preclude SARS-Cov-2 infection and should not be used as the sole basis for treatment or other patient management decisions. A negative result may occur with  improper specimen collection/handling, submission of specimen other than nasopharyngeal swab, presence of viral mutation(s) within the areas targeted by this assay, and inadequate number of viral copies(<138 copies/mL). A negative result must be combined with clinical observations, patient history, and epidemiological information. The expected result is Negative.  Fact Sheet for Patients:  EntrepreneurPulse.com.au  Fact Sheet for Healthcare Providers:  IncredibleEmployment.be  This test is no t yet approved or cleared by the [place] FDA and  has been authorized for detection and/or diagnosis of SARS-CoV-2 by FDA under an Emergency Use Authorization (EUA). This EUA will remain  in effect (meaning this test can be used) for the duration of the COVID-19 declaration under Section 564(b)(1) of the Act, 21 U.S.C.section 360bbb-3(b)(1), unless the authorization is terminated  or revoked sooner.       Influenza A by PCR NEGATIVE NEGATIVE Final   Influenza  B by PCR NEGATIVE NEGATIVE Final    Comment: (NOTE) The Xpert Xpress SARS-CoV-2/FLU/RSV plus assay is intended as an aid in the diagnosis of influenza from Nasopharyngeal swab specimens and should not be used as a sole basis for treatment. Nasal washings and aspirates are unacceptable for Xpert Xpress SARS-CoV-2/FLU/RSV testing.  Fact Sheet for Patients: EntrepreneurPulse.com.au  Fact Sheet for Healthcare Providers: IncredibleEmployment.be  This test is not yet approved or cleared by the [place] FDA and has been authorized for detection and/or diagnosis of SARS-CoV-2 by FDA under an Emergency Use Authorization (EUA). This EUA will remain in effect (meaning this test can be used) for the duration of the COVID-19 declaration under Section 564(b)(1) of the Act, 21 U.S.C. section 360bbb-3(b)(1), unless the authorization  is terminated or revoked.  Performed at [facility], [address] [place], [place] [address]          Radiology Studies: No results found.      Scheduled Meds: . acetaminophen  1,000 mg Oral TID  . buPROPion  150 mg Oral Daily  . dexamethasone  2 mg Oral Q12H  . diclofenac Sodium  2-4 g Topical QID  . enoxaparin (LOVENOX) injection  40 mg Subcutaneous Q24H  . feeding supplement  237 mL Oral TID BM  . folic acid  1 mg Oral Daily  . gabapentin  400 mg Oral TID  . multivitamin with minerals  1 tablet Oral Daily  . nicotine  14 mg Transdermal Daily  . pantoprazole  40 mg Oral Daily  . traZODone  50 mg Oral QHS   Continuous Infusions:    LOS: 3 days    Time spent: 15 minutes    Sidney Ace, MD [facility] Pager [phone]  If 7PM-7AM, please contact night-coverage 07/14/2020, 1:29 PM

## 2020-07-15 ENCOUNTER — Ambulatory Visit: Payer: No Typology Code available for payment source | Attending: Radiation Oncology

## 2020-07-15 DIAGNOSIS — G893 Neoplasm related pain (acute) (chronic): Secondary | ICD-10-CM | POA: Diagnosis not present

## 2020-07-15 DIAGNOSIS — Z515 Encounter for palliative care: Secondary | ICD-10-CM | POA: Diagnosis not present

## 2020-07-15 DIAGNOSIS — Z7952 Long term (current) use of systemic steroids: Secondary | ICD-10-CM

## 2020-07-15 DIAGNOSIS — Z79891 Long term (current) use of opiate analgesic: Secondary | ICD-10-CM

## 2020-07-15 DIAGNOSIS — M25552 Pain in left hip: Secondary | ICD-10-CM | POA: Diagnosis not present

## 2020-07-15 DIAGNOSIS — C7951 Secondary malignant neoplasm of bone: Secondary | ICD-10-CM

## 2020-07-15 MED ORDER — MORPHINE SULFATE (PF) 2 MG/ML IV SOLN
2.0000 mg | Freq: Two times a day (BID) | INTRAVENOUS | Status: DC | PRN
  Administered 2020-07-15 – 2020-07-18 (×5): 2 mg via INTRAVENOUS
  Filled 2020-07-15 (×5): qty 1

## 2020-07-15 MED ORDER — OXYCODONE HCL ER 15 MG PO T12A
15.0000 mg | EXTENDED_RELEASE_TABLET | Freq: Two times a day (BID) | ORAL | Status: DC
  Administered 2020-07-15 – 2020-07-19 (×9): 15 mg via ORAL
  Filled 2020-07-15 (×9): qty 1

## 2020-07-15 NOTE — Progress Notes (Signed)
[place]  [phone] [phone]   Name: Aleph Nickson Date: 07/15/2020 MRN: [id number]  DOB: Nov 30, 1955  Patient Care Team: Patient, No Pcp Per as PCP - General (General Practice)    REASON FOR CONSULTATION: Kevin Baker is a 65 y.o. 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  Patient was hospitalized 05/13/2020 to 05/14/2020 with progressive left groin pain.  He was found to have an inguinal hernia, which was reducible and did not require surgery.  CT of the chest revealed a left lower lobe mass concerning for primary bronchogenic carcinoma.  Patient also had several nodules in the left upper lobe and left posterior costophrenic sulcus.  He ultimately left AMA.  Patient was again hospitalized 07/09/2020 with worsening left hip pain.  CT of the left hip revealed a metastatic lesion in the left trochanter.  Palliative care is consulted to help address goals and manage ongoing symptoms..   CODE STATUS: Full code  PAST MEDICAL HISTORY: Past Medical History:  Diagnosis Date  . Alcohol use disorder, severe, dependence ([place])   . Mass of lower lobe of left lung     PAST SURGICAL HISTORY:  Past Surgical History:  Procedure Laterality Date  . BACK SURGERY    . LEG SURGERY Right   . PELVIC FRACTURE SURGERY    . VIDEO BRONCHOSCOPY WITH ENDOBRONCHIAL NAVIGATION Left 07/12/2020   Procedure: VIDEO BRONCHOSCOPY WITH ENDOBRONCHIAL NAVIGATION;  Surgeon: Ottie Glazier, MD;  Location: [facility] ORS;  Service: Thoracic;  Laterality: Left;    HEMATOLOGY/ONCOLOGY HISTORY:  Oncology History   No history exists.    ALLERGIES:  is allergic to codeine, sulfamethoxazole-trimethoprim, and lactose intolerance (gi).  MEDICATIONS:  Current Facility-Administered Medications  Medication Dose Route Frequency Provider Last  Rate Last Admin  . acetaminophen (TYLENOL) tablet 1,000 mg  1,000 mg Oral TID Ralene Muskrat B, MD   1,000 mg at 07/15/20 0933  . alum & mag hydroxide-simeth (MAALOX/MYLANTA) 200-200-20 MG/5ML suspension 15 mL  15 mL Oral Q4H PRN Ralene Muskrat B, MD   15 mL at 07/12/20 1845  . baclofen (LIORESAL) tablet 10 mg  10 mg Oral TID PRN Lenore Cordia, MD      . buPROPion [facility] SR) 12 hr tablet 150 mg  150 mg Oral Daily Lenore Cordia, MD   150 mg at 07/15/20 0933  . dexamethasone (DECADRON) tablet 2 mg  2 mg Oral Q12H Randee Huston, Kirt Boys, NP   2 mg at 07/15/20 0936  . diclofenac Sodium (VOLTAREN) 1 % topical gel 2-4 g  2-4 g Topical QID Lenore Cordia, MD   4 g at 07/14/20 2058  . enoxaparin (LOVENOX) injection 40 mg  40 mg Subcutaneous Q24H Sreenath, Sudheer B, MD      . feeding supplement (ENSURE ENLIVE / ENSURE PLUS) liquid 237 mL  237 mL Oral TID BM Sreenath, Sudheer B, MD   237 mL at 28/41/32 4401  . folic acid (FOLVITE) tablet 1 mg  1 mg Oral Daily Ralene Muskrat B, MD   1 mg at 07/15/20 0936  . gabapentin (NEURONTIN) capsule 400 mg  400 mg Oral TID Ralene Muskrat B, MD   400 mg at 07/15/20 0272  . hydrOXYzine (VISTARIL) capsule 25 mg  25 mg Oral QID PRN Zada Finders R, MD      . morphine 2 MG/ML injection 2 mg  2 mg Intravenous BID PRN Ralene Muskrat B, MD      . multivitamin with minerals tablet 1 tablet  1 tablet Oral Daily Ralene Muskrat B, MD   1 tablet at 07/15/20 0934  . nicotine (NICODERM CQ - dosed in mg/24 hours) patch 14 mg  14 mg Transdermal Daily Lenore Cordia, MD   14 mg at 07/15/20 0934  . nicotine polacrilex (NICORETTE) gum 2 mg  2 mg Oral PRN Ralene Muskrat B, MD   2 mg at 07/12/20 1046  . ondansetron (ZOFRAN) tablet 4 mg  4 mg Oral Q6H PRN Zada Finders R, MD   4 mg at 07/10/20 2114   Or  . ondansetron (ZOFRAN) injection 4 mg  4 mg Intravenous Q6H PRN Zada Finders R, MD      . oxyCODONE (Oxy IR/ROXICODONE) immediate release tablet 5-10 mg  5-10 mg  Oral Q4H PRN Ralene Muskrat B, MD   5 mg at 07/15/20 1240  . pantoprazole (PROTONIX) EC tablet 40 mg  40 mg Oral Daily Ralene Muskrat B, MD   40 mg at 07/15/20 0935  . senna-docusate (Senokot-S) tablet 1 tablet  1 tablet Oral QHS PRN Lenore Cordia, MD      . traZODone (DESYREL) tablet 50 mg  50 mg Oral QHS Ralene Muskrat B, MD   50 mg at 07/14/20 2057    VITAL SIGNS: BP 97/66 (BP Location: Right Arm)   Pulse 84   Temp 98.6 F (37 C)   Resp 19   Ht 6' (1.829 m)   Wt 129 lb 13.6 oz (58.9 kg)   SpO2 99%   BMI 17.61 kg/m  Filed Weights   07/09/20 1433 07/10/20 1535  Weight: 150 lb (68 kg) 129 lb 13.6 oz (58.9 kg)    Estimated body mass index is 17.61 kg/m as calculated from the following:   Height as of this encounter: 6' (1.829 m).   Weight as of this encounter: 129 lb 13.6 oz (58.9 kg).  LABS: CBC:    Component Value Date/Time   WBC 9.0 07/14/2020 0743   HGB 10.7 (L) 07/14/2020 0743   HCT 30.6 (L) 07/14/2020 0743   PLT 506 (H) 07/14/2020 0743   MCV 97.8 07/14/2020 0743   NEUTROABS 6.5 07/14/2020 0743   LYMPHSABS 1.7 07/14/2020 0743   MONOABS 0.7 07/14/2020 0743   EOSABS 0.0 07/14/2020 0743   BASOSABS 0.0 07/14/2020 0743   Comprehensive Metabolic Panel:    Component Value Date/Time   NA 133 (L) 07/14/2020 0743   K 3.9 07/14/2020 0743   CL 94 (L) 07/14/2020 0743   CO2 31 07/14/2020 0743   BUN 12 07/14/2020 0743   CREATININE 0.53 (L) 07/14/2020 0743   GLUCOSE 105 (H) 07/14/2020 0743   CALCIUM 8.8 (L) 07/14/2020 0743   AST 19 07/09/2020 2120   ALT 16 07/09/2020 2120   ALKPHOS 66 07/09/2020 2120   BILITOT 0.5 07/09/2020 2120   PROT 7.2 07/09/2020 2120   ALBUMIN 3.4 (L) 07/09/2020 2120    RADIOGRAPHIC STUDIES: MR Lumbar Spine W Wo Contrast  Result Date: 07/11/2020 CLINICAL DATA:  65 year old male with low back and left lower extremity pain progressed x3 months. Spiculated left lower lobe lung mass in December. EXAM: MRI LUMBAR SPINE WITHOUT AND WITH  CONTRAST TECHNIQUE: Multiplanar and multiecho pulse sequences of the lumbar spine were obtained without and with intravenous contrast. CONTRAST:  55m GADAVIST GADOBUTROL 1 MMOL/ML IV SOLN COMPARISON:  CT Chest, Abdomen, and Pelvis 05/14/2020. FINDINGS: Segmentation:  Normal on the December CT. Alignment: Grade 2 spondylolisthesis of L4 on L5, and grade 1 anterolisthesis of L5 on S1 are stable since December. No associated pars fractures. Underlying mild dextroconvex lumbar scoliosis. Vertebrae: Abnormal decreased T1 signal, STIR hyperintensity and patchy enhancement in the left sacral ala partially visible on series 7, image 11. Elsewhere the visible sacrum and left SI joint appear intact. However, left SI joint ankylosis was demonstrated on the prior CT. Subtle semicircular area of increased STIR signal in the left posterolateral L2 vertebral body (series 7, image 9) demonstrates mild enhancement, and a subtle sclerotic lesion was present here in December. However, axial MRI appearance today raises the possibility of degenerative Schmorl's node (series 8, image 15). The lesion is 16 mm diameter by 8 mm thick. Otherwise normal marrow signal in the visible lower thoracic and lumbar spine. Mild chronic T11 superior endplate compression is stable since December. Conus medullaris and cauda equina: Conus extends to the T12-L1 level. No lower spinal cord or conus signal abnormality. No abnormal intradural enhancement. No dural thickening. Cauda equina nerve roots appear normal. Paraspinal and other soft tissues: Partially visible distended urinary bladder. Trace free fluid in the pelvis. Otherwise negative visible abdominal viscera. Negative lumbar paraspinal soft tissues. Disc levels: Capacious lower thoracic and lumbar spinal canal above L4. L4-L5: Grade 2 spondylolisthesis with advanced disc and posterior element degeneration. Severe facet hypertrophy greater on the right with degenerative facet joint fluid. Moderate  spinal stenosis and bilateral lateral recess stenosis. Moderate to severe bilateral L4 foraminal stenosis. L5-S1: Mild anterolisthesis with disc and posterior element degeneration but no spinal stenosis. There is moderate right L5 foraminal stenosis. IMPRESSION: 1. Partially visible abnormal signal in the left sacral ala with patchy enhancement. This does not seem mass-like, and more resembles a sacral insufficiency fracture than metastatic disease (but see also #2). 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 2. Small 5 x 16 mm enhancing lesion in the L2 posterosuperior vertebral body is indeterminate for small bone Metastasis versus Schmorl's node. Follow-up PET-CT might best characterize further. Failing MAC, short interval 2-3 month follow-up MRI would be most valuable. 3. No other metastatic disease identified in the visible lower thoracic or lumbar spine. 4. Degenerative grade 2 spondylolisthesis at L4-L5 with advanced disc and posterior element degeneration, moderate spinal, lateral recess, and moderate to severe foraminal stenosis. Grade 1 spondylolisthesis at L5-S1 with moderate right foraminal stenosis. Electronically Signed   By: Genevie Ann M.D.   On: 07/11/2020 08:10   CT Hip Left Wo Contrast  Result Date: 07/10/2020 CLINICAL DATA:  Left leg and arm weakness with pain since March 2021, negative radiographs EXAM: CT OF THE LEFT HIP WITHOUT CONTRAST TECHNIQUE: Multidetector CT imaging of the left hip was performed according to the standard protocol. Multiplanar CT image reconstructions were also generated. COMPARISON:  Radiograph 07/09/2020, CT 05/14/2020 FINDINGS: Bones/Joint/Cartilage The osseous structures appear diffusely demineralized which may limit detection of small or nondisplaced fractures. Ovoid lucent lesion centered within the lesser trochanter measuring approximately 2.6 x 2.3 x 3.1 cm in size. No clear associated pathologic fracture is evident at this time. No other suspicious  lytic or blastic lesions within the included margins of imaging. No other acute bony abnormality. Specifically, no fracture, subluxation, or dislocation of the left hip or included bony pelvis. More remote remote posttraumatic changes of the left pubic root, superior and inferior rami, unchanged from comparison CT. Ankylosis left SI joint and likely prior traumatic deformity of the left ilium. Degenerative  change at the pubic symphysis and left hip is similar to comparison. No sizable hip effusion. Ligaments Suboptimally assessed by CT. Muscles and Tendons No acute musculotendinous abnormality is seen. Soft tissues Atherosclerotic calcifications in the iliac arteries and proximal common and superficial femoral arteries. Circumferential thickening of the urinary bladder though possibly related to underdistention. Fluid-filled appearance of the colon, correlate for rapid transit state. IMPRESSION: 1. Ovoid lucent lesion centered within the left lesser trochanter measuring approximately 2.6 x 2.3 x 3.1 cm in size. No clear associated pathologic fracture is evident at this time. Findings concerning for metastatic lesion given known left lung lesion. 2. Remote posttraumatic changes of the left pubic root, superior and inferior rami, and left ilium unchanged from comparison CT. 3. Ankylosis left SI joint. 4. Circumferential thickening of the urinary bladder though possibly related to underdistention. Correlate with urinalysis to exclude cystitis. 5. Fluid-filled appearance of the colon, could reflect diarrheal illness/rapid transit state. 6. Atherosclerosis These results were called by telephone at the time of interpretation on 07/10/2020 at 12:23 am to provider Dr. Tamala Julian, who verbally acknowledged these results. Electronically Signed   By: Lovena Le M.D.   On: 07/10/2020 00:23   DG C-Arm 1-60 Min-No Report  Result Date: 07/12/2020 Fluoroscopy was utilized by the requesting physician.  No radiographic  interpretation.   DG Hip Unilat W or Wo Pelvis 2-3 Views Left  Result Date: 07/09/2020 CLINICAL DATA:  Acute on chronic hip pain EXAM: DG HIP (WITH OR WITHOUT PELVIS) 2-3V LEFT COMPARISON:  05/13/2020 FINDINGS: Chronic deformity of left iliac bone and pubic rami. No acute displaced fracture or malalignment is seen. There are mild degenerative changes of both hips. Vascular calcifications. IMPRESSION: No acute osseous abnormality. Chronic deformity of left iliac bone and pubic rami. Electronically Signed   By: Donavan Foil M.D.   On: 07/09/2020 21:25   CT Super D Chest W Contrast  Result Date: 07/11/2020 CLINICAL DATA:  Left lower lobe mass, EBUS planning EXAM: CT CHEST WITH CONTRAST TECHNIQUE: Multidetector CT imaging of the chest was performed using thin slice collimation for electromagnetic bronchoscopy planning purposes, with intravenous contrast. CONTRAST:  58m OMNIPAQUE IOHEXOL 300 MG/ML  SOLN COMPARISON:  CT chest, 05/14/2020 FINDINGS: Cardiovascular: Aortic atherosclerosis. Normal heart size. Three-vessel coronary artery calcifications and/or stents. No pericardial effusion. Mediastinum/Nodes: No enlarged mediastinal, hilar, or axillary lymph nodes. Thyroid gland, trachea, and esophagus demonstrate no significant findings. Lungs/Pleura: Slight interval enlargement of a large mass of the superior segment left lower lobe, measuring 4.9 x 4.4 cm, previously 4.6 x 3.8 cm when measured similarly (series 3, image 40). There is postobstructive consolidation and nodularity of the dependent left lower lobe (series 3, image 51). Significant interval enlargement of a spiculated appearing nodule of the anterior left upper lobe measuring 1.4 x 1.2 cm, previously 1.1 x 1.0 cm when measured similarly (series 3, image 37). Unchanged small nodule of the superior segment right lower lobe measuring 4 mm (series 3, image 27). Slight interval enlargement of a nodule of the peripheral right upper lobe measuring 3 mm,  previously no greater than 1-2 mm (series 3, image 35). Minimal centrilobular emphysema. No pleural effusion or pneumothorax. Upper Abdomen: No acute abnormality. Musculoskeletal: No chest wall mass or suspicious bone lesions identified. IMPRESSION: 1. Slight interval enlargement of a large mass of the superior segment left lower lobe, measuring 4.9 x 4.4 cm, previously 4.6 x 3.8 cm when measured similarly. There is postobstructive consolidation and nodularity of the dependent left lower lobe. Findings  are consistent with primary lung malignancy. 2. Significant interval enlargement of a spiculated appearing nodule of the anterior left upper lobe measuring 1.4 x 1.2 cm, previously 1.1 x 1.0 cm when measured similarly. This is concerning for a synchronous primary versus metastasis. 3. Slight interval enlargement of a nodule of the peripheral right upper lobe measuring 3 mm, previously no greater than 1-2 mm. This is again concerning for synchronous primary or metastasis. 4. Unchanged small nodule of the superior segment right lower lobe measuring 4 mm, nonspecific. 5. No mediastinal lymphadenopathy. 6. Minimal centrilobular emphysema. 7. Coronary artery disease. Aortic Atherosclerosis (ICD10-I70.0) and Emphysema (ICD10-J43.9). Electronically Signed   By: Eddie Candle M.D.   On: 07/11/2020 14:10    PERFORMANCE STATUS (ECOG) : 3 - Symptomatic, >50% confined to bed  Review of Systems Unless otherwise noted, a complete review of systems is negative.  Physical Exam General: NAD Pulmonary: Unlabored Extremities: no edema, no joint deformities Skin: no rashes Neurological: Weakness but otherwise nonfocal  IMPRESSION: Follow-up visit  Patient plans to start XRT to the hip.  Ortho consulted and did not recommend surgical fixation.  Today, patient says that his pain is severe.  He currently rates it a 7-12 out of 10.  Note that he was sleeping when I entered the room initially and subjective pain seemed  somewhat disproportionate to his clinical appearance.  Patient is receiving oxycodone, averaging every 5 to 6 hours.  He says the oxycodone is helpful but short-lived.  We will start him on OxyContin for long-acting control.  Patient continues to take dexamethasone to hopefully help with inflammatory pain giving bone metastasis.  Disposition is unclear.  Patient will likely benefit from short-term rehab if transportation issues can be arranged through XRT.  Patient was living with his brother but his sister-in-law died last week from cancer.  If patient goes to rehab, I would strongly recommend that he be followed by palliative care at the facility.  PLAN: -Continue current scope of treatment -Recommend rehab with palliative care following -Start OxyContin 15 mg every 12 hours -Continue oxycodone IR 10 mg every 4 hours as needed for breakthrough pain -Continue dexamethasone -Continue daily bowel regimen  Case and plan discussed with Dr. Rogue Bussing   Time Total: 20 minutes  Visit consisted of counseling and education dealing with the complex and emotionally intense issues of symptom management and palliative care in the setting of serious and potentially life-threatening illness.Greater than 50%  of this time was spent counseling and coordinating care related to the above assessment and plan.  Signed by: Altha Harm, PhD, NP-C

## 2020-07-15 NOTE — Progress Notes (Signed)
PROGRESS NOTE    Kevin Baker  JOI:[id number] DOB: March 18, 1956 DOA: 07/09/2020 PCP: Patient, No Pcp Per   Brief Narrative:  Patient with history of alcohol use disorder, tobacco use, hepatitis C, chronic gastritis, chronic pain who presents for evaluation of worsening left hip pain and difficulty ambulating.  He was previously seen in the hospital in December 2021 for similar symptoms.  At that time the primary bronchogenic carcinoma was made aware to him and he was recommended to stay for further evaluation and management however during that admission he elected to leave [facility].  He was seen by outpatient orthopedics and was given a prescription for Neurontin for left hip pain and a referral to neurology.  Patient is not followed up.  Presented to ED with progressive worsening hip pain.  I have communicated with oncology who agreed to consult on the patient.  Recommendations appreciated.  Per oncology requesting orthopedic evaluation for surgical fixation of a osseous lesion in the left femur.  Palliative care also involved.  Orthopedics consulted, not recommending surgical fixation at this time.  Communicated with medical oncology and pulmonology.  We will plan for bronchoscopy/EBUS for definitive diagnosis on 2/25.  Radiation oncology contacted for palliative radiation.  Patient underwent successful bronchoscopy/transbronchial biopsy on 2/25.  Results will be available in approximately 5 days.  Patient was also seen by neurosurgery at the request orthopedist.  No neurosurgical intervention warranted at this time.  2/28: Patient scheduled for CT simulation in preparation for palliative radiation.  Still endorsing substantial pain though report of pain level is not in correlation with physical exam and general appearance   Assessment & Plan:   Principal Problem:   Acute on chronic pain of left hip Active Problems:   Mass of lower lobe of left lung   Hypokalemia    Hypomagnesemia   Normocytic anemia   Palliative care encounter   Protein-calorie malnutrition, severe   Cancer associated pain  Acute on chronic left hip pain with generalized weakness and frequent falls Degenerative joint disease of the left hip: CT imaging of the left hip concerning for metastatic lesion at the left lesser trochanter.  Also shows remote posttraumatic changes of the left pubic root, superior and inferior rami, left ilium and ankylosis at the left SI joint. -Oncology consult requested -Orthopedic consult requested -Radiation oncology requested Plan: Continue multimodal pain control Appreciate palliative care involvement OxyContin 15 mg every 12 hours Oxycodone 10 mg every 4 hours as needed Dexamethasone twice daily Daily bowel regimen Up and ambulate with physical therapy CT stimulation today We will discuss with case management regarding disposition as rehab placement might be a problem in the setting of palliative radiation   Left lower lobe lung mass: 4.6 x 3.8 spiculated mass seen at the left lower lobe on CT imaging 05/14/2020 concerning for primary bronchogenic carcinoma.  Has not had outpatient follow-up for this yet.  Discussed findings with patient. -Status post transbronchial biopsy on 2/25.  Appreciate pulmonology involvement.  Results should be available by midweek  Alcohol use disorder: Reports he quit drinking about 2 months ago.  Not currently exhibiting any signs of withdrawal.  Continue CIWA checks without Ativan for now.  Normocytic anemia: Without obvious bleeding.   No indication for transfusion Iron indices indicate anemia of chronic disease  Hypokalemia/hypomagnesemia: Monitor replace as necessary  Tobacco use: Reports cutting back to 1.5 PPD per week.  Patient strongly advised to quit smoking completely given likely bronchogenic cancer.  Nicotine patch and lozenges ordered.  DVT prophylaxis: SQ Lovenox Code Status: Full Family  Communication: None today Disposition Plan: Status is: Inpatient  Remains inpatient appropriate because:Inpatient level of care appropriate due to severity of illness   Dispo: The patient is from: Home              Anticipated d/c is to: SNF              Patient currently is not medically stable to d/c.   Difficult to place patient Yes   Pain control appears to be improving.  Will continue to taper IV pain medication.  Therapy services are recommended skilled nursing facility.  Will need to discuss with TOC regarding disposition appropriateness this patient will likely need to be transported to radiation therapy.    Level of care: Med-Surg  Consultants:   Oncology  Orthopedics  Palliative care  Pulmonology  Procedures:   None  Antimicrobials:   None   Subjective: Patient seen and examined.  Improved pain control.  Tolerating p.o. intake.  Objective: Vitals:   07/14/20 1941 07/15/20 0014 07/15/20 0741 07/15/20 1210  BP: 95/68 (!) 84/55 98/75 97/66   Pulse: 80 69 86 84  Resp: 17 15 18 19   Temp: 98.1 F (36.7 C) 98 F (36.7 C) 98.3 F (36.8 C) 98.6 F (37 C)  TempSrc:      SpO2: 99% 99% 98% 99%  Weight:      Height:        Intake/Output Summary (Last 24 hours) at 07/15/2020 1345 Last data filed at 07/15/2020 0728 Gross per 24 hour  Intake --  Output 1900 ml  Net -1900 ml   Filed Weights   07/09/20 1433 07/10/20 1535  Weight: 68 kg 58.9 kg    Examination:  General exam: Appears calm and comfortable  Respiratory system: Decreased air entry.  Scattered coarse crackles bilaterally.  Normal work of breathing.  Room air Cardiovascular system: S1 & S2 heard, RRR. No JVD, murmurs, rubs, gallops or clicks. No pedal edema. Gastrointestinal system: Abdomen is nondistended, soft and nontender. No organomegaly or masses felt. Normal bowel sounds heard. Central nervous system: Alert and oriented. No focal neurological deficits. Extremities: Markedly decreased  power left lower extremity Skin: No rashes, lesions or ulcers Psychiatry: Judgement and insight appear normal. Mood & affect appropriate.     Data Reviewed: I have personally reviewed following labs and imaging studies  CBC: Recent Labs  Lab 07/09/20 1513 07/10/20 0608 07/12/20 0923 07/14/20 0743  WBC 8.6 8.4 10.2 9.0  NEUTROABS  --   --  8.3* 6.5  HGB 9.9* 10.4* 10.3* 10.7*  HCT 28.7* 29.1* 29.2* 30.6*  MCV 97.0 93.9 96.4 97.8  PLT 509* 501* 509* 417*   Basic Metabolic Panel: Recent Labs  Lab 07/09/20 1513 07/09/20 2313 07/10/20 0608 07/12/20 0923 07/14/20 0743  NA 133*  --  133* 132* 133*  K 3.0*  --  3.1* 3.3* 3.9  CL 98  --  100 98 94*  CO2 25  --  24 25 31   GLUCOSE 103*  --  90 118* 105*  BUN 26*  --  19 13 12   CREATININE 1.03  --  0.88 0.65 0.53*  CALCIUM 9.4  --  9.1 8.8* 8.8*  MG  --  1.1* 1.8  --   --   PHOS  --   --  2.0*  --   --    GFR: Estimated Creatinine Clearance: 76.7 mL/min (A) (by C-G formula based on SCr of 0.53 mg/dL (  L)). Liver Function Tests: Recent Labs  Lab 07/09/20 2120  AST 19  ALT 16  ALKPHOS 66  BILITOT 0.5  PROT 7.2  ALBUMIN 3.4*   Recent Labs  Lab 07/09/20 2120  LIPASE 39   No results for input(s): AMMONIA in the last 168 hours. Coagulation Profile: No results for input(s): INR, PROTIME in the last 168 hours. Cardiac Enzymes: No results for input(s): CKTOTAL, CKMB, CKMBINDEX, TROPONINI in the last 168 hours. BNP (last 3 results) No results for input(s): PROBNP in the last 8760 hours. HbA1C: No results for input(s): HGBA1C in the last 72 hours. CBG: No results for input(s): GLUCAP in the last 168 hours. Lipid Profile: No results for input(s): CHOL, HDL, LDLCALC, TRIG, CHOLHDL, LDLDIRECT in the last 72 hours. Thyroid Function Tests: No results for input(s): TSH, T4TOTAL, FREET4, T3FREE, THYROIDAB in the last 72 hours. Anemia Panel: No results for input(s): VITAMINB12, FOLATE, FERRITIN, TIBC, IRON, RETICCTPCT in  the last 72 hours. Sepsis Labs: No results for input(s): PROCALCITON, LATICACIDVEN in the last 168 hours.  Recent Results (from the past 240 hour(s))  Resp Panel by RT-PCR (Flu A&B, Covid) Nasopharyngeal Swab     Status: None   Collection Time: 07/09/20 11:13 PM   Specimen: Nasopharyngeal Swab; Nasopharyngeal(NP) swabs in vial transport medium  Result Value Ref Range Status   SARS Coronavirus 2 by RT PCR NEGATIVE NEGATIVE Final    Comment: (NOTE) SARS-CoV-2 target nucleic acids are NOT DETECTED.  The SARS-CoV-2 RNA is generally detectable in upper respiratory specimens during the acute phase of infection. The lowest concentration of SARS-CoV-2 viral copies this assay can detect is 138 copies/mL. A negative result does not preclude SARS-Cov-2 infection and should not be used as the sole basis for treatment or other patient management decisions. A negative result may occur with  improper specimen collection/handling, submission of specimen other than nasopharyngeal swab, presence of viral mutation(s) within the areas targeted by this assay, and inadequate number of viral copies(<138 copies/mL). A negative result must be combined with clinical observations, patient history, and epidemiological information. The expected result is Negative.  Fact Sheet for Patients:  EntrepreneurPulse.com.au  Fact Sheet for Healthcare Providers:  IncredibleEmployment.be  This test is no t yet approved or cleared by the [place] FDA and  has been authorized for detection and/or diagnosis of SARS-CoV-2 by FDA under an Emergency Use Authorization (EUA). This EUA will remain  in effect (meaning this test can be used) for the duration of the COVID-19 declaration under Section 564(b)(1) of the Act, 21 U.S.C.section 360bbb-3(b)(1), unless the authorization is terminated  or revoked sooner.       Influenza A by PCR NEGATIVE NEGATIVE Final   Influenza B by PCR  NEGATIVE NEGATIVE Final    Comment: (NOTE) The Xpert Xpress SARS-CoV-2/FLU/RSV plus assay is intended as an aid in the diagnosis of influenza from Nasopharyngeal swab specimens and should not be used as a sole basis for treatment. Nasal washings and aspirates are unacceptable for Xpert Xpress SARS-CoV-2/FLU/RSV testing.  Fact Sheet for Patients: EntrepreneurPulse.com.au  Fact Sheet for Healthcare Providers: IncredibleEmployment.be  This test is not yet approved or cleared by the [place] FDA and has been authorized for detection and/or diagnosis of SARS-CoV-2 by FDA under an Emergency Use Authorization (EUA). This EUA will remain in effect (meaning this test can be used) for the duration of the COVID-19 declaration under Section 564(b)(1) of the Act, 21 U.S.C. section 360bbb-3(b)(1), unless the authorization is terminated or revoked.  Performed  at [facility], [address], [place], [place] [address]          Radiology Studies: No results found.      Scheduled Meds: . acetaminophen  1,000 mg Oral TID  . buPROPion  150 mg Oral Daily  . dexamethasone  2 mg Oral Q12H  . diclofenac Sodium  2-4 g Topical QID  . enoxaparin (LOVENOX) injection  40 mg Subcutaneous Q24H  . feeding supplement  237 mL Oral TID BM  . folic acid  1 mg Oral Daily  . gabapentin  400 mg Oral TID  . multivitamin with minerals  1 tablet Oral Daily  . nicotine  14 mg Transdermal Daily  . oxyCODONE  15 mg Oral Q12H  . pantoprazole  40 mg Oral Daily  . traZODone  50 mg Oral QHS   Continuous Infusions:    LOS: 4 days    Time spent: 15 minutes    Sidney Ace, MD [facility] Pager [phone]  If 7PM-7AM, please contact night-coverage 07/15/2020, 1:45 PM

## 2020-07-15 NOTE — Progress Notes (Signed)
Kevin Baker   DOB:05-20-1955   YF#:[id number]    Subjective: Patient continues to complain of pain in his left hip.  Patient is awaiting radiation simulation today.  Denies any headaches.  Objective:  Vitals:   07/15/20 0014 07/15/20 0741  BP: (!) 84/55 98/75  Pulse: 69 86  Resp: 15 18  Temp: 98 F (36.7 C) 98.3 F (36.8 C)  SpO2: 99% 98%     Intake/Output Summary (Last 24 hours) at 07/15/2020 1128 Last data filed at 07/15/2020 9163 Gross per 24 hour  Intake --  Output 2400 ml  Net -2400 ml    Physical Exam Constitutional:      Comments: Frail-appearing Caucasian male patient.  Is resting the bed.  He seems to be no acute distress.  HENT:     Head: Normocephalic and atraumatic.     Mouth/Throat:     Mouth: Oropharynx is clear and moist.     Pharynx: No oropharyngeal exudate.  Eyes:     Pupils: Pupils are equal, round, and reactive to light.  Cardiovascular:     Rate and Rhythm: Normal rate and regular rhythm.  Pulmonary:     Effort: No respiratory distress.     Breath sounds: No wheezing.     Comments: Decreased air entry. Abdominal:     General: Bowel sounds are normal. There is no distension.     Palpations: Abdomen is soft. There is no mass.     Tenderness: There is no abdominal tenderness. There is no guarding or rebound.  Musculoskeletal:        General: No tenderness or edema. Normal range of motion.     Cervical back: Normal range of motion and neck supple.  Skin:    General: Skin is warm.  Neurological:     Mental Status: He is alert and oriented to person, place, and time.  Psychiatric:        Mood and Affect: Affect normal.      Labs:  Lab Results  Component Value Date   WBC 9.0 07/14/2020   HGB 10.7 (L) 07/14/2020   HCT 30.6 (L) 07/14/2020   MCV 97.8 07/14/2020   PLT 506 (H) 07/14/2020   NEUTROABS 6.5 07/14/2020    Lab Results  Component Value Date   NA 133 (L) 07/14/2020   K 3.9 07/14/2020   CL 94 (L) 07/14/2020   CO2 31 07/14/2020     Studies:  No results found.  Mass of lower lobe of left lung 65 year old male patient history of smoking/alcohol is currently in the hospital for worsening left-sided hip pain; noted to have a large left lung mass.  # 4-5 cm left lung lower lobe mass-s/p bronchoscopy 2/25-[Dr.A]-positive for malignancy.  Awaiting immunohistochemical stains; ordered foundation 1 testing.   #Left hip pain-soft tissue lesions noted in the femoral area likely secondary to metastatic disease status post evaluation with radiation oncology; awaiting simulation today.  Discussed with Dr. Donella Stade recommend 5 fractions of radiations.  Continue narcotic pain medication/steroids-as per primary team/palliative care. Discussed with [employer].   #Disposition: Unfortunately patient has poor social support; given his difficulty with ambulation I think it is reasonable to consider rehab.  Discussed with patient that transportation for radiation could be a deterrent for his rehab placement.  Discussed with Dr. Priscella Mann.    Cammie Sickle, MD 07/15/2020  11:28 AM

## 2020-07-16 ENCOUNTER — Other Ambulatory Visit: Payer: Self-pay | Admitting: Specialist

## 2020-07-16 DIAGNOSIS — M25552 Pain in left hip: Secondary | ICD-10-CM | POA: Diagnosis not present

## 2020-07-16 DIAGNOSIS — G893 Neoplasm related pain (acute) (chronic): Secondary | ICD-10-CM | POA: Diagnosis not present

## 2020-07-16 DIAGNOSIS — Z515 Encounter for palliative care: Secondary | ICD-10-CM | POA: Diagnosis not present

## 2020-07-16 LAB — CBC WITH DIFFERENTIAL/PLATELET
Abs Immature Granulocytes: 0.04 10*3/uL (ref 0.00–0.07)
Basophils Absolute: 0 10*3/uL (ref 0.0–0.1)
Basophils Relative: 0 %
Eosinophils Absolute: 0 10*3/uL (ref 0.0–0.5)
Eosinophils Relative: 0 %
HCT: 28.9 % — ABNORMAL LOW (ref 39.0–52.0)
Hemoglobin: 9.6 g/dL — ABNORMAL LOW (ref 13.0–17.0)
Immature Granulocytes: 1 %
Lymphocytes Relative: 20 %
Lymphs Abs: 1.6 10*3/uL (ref 0.7–4.0)
MCH: 33.4 pg (ref 26.0–34.0)
MCHC: 33.2 g/dL (ref 30.0–36.0)
MCV: 100.7 fL — ABNORMAL HIGH (ref 80.0–100.0)
Monocytes Absolute: 0.6 10*3/uL (ref 0.1–1.0)
Monocytes Relative: 8 %
Neutro Abs: 6 10*3/uL (ref 1.7–7.7)
Neutrophils Relative %: 71 %
Platelets: 497 10*3/uL — ABNORMAL HIGH (ref 150–400)
RBC: 2.87 MIL/uL — ABNORMAL LOW (ref 4.22–5.81)
RDW: 13.2 % (ref 11.5–15.5)
WBC: 8.4 10*3/uL (ref 4.0–10.5)
nRBC: 0 % (ref 0.0–0.2)

## 2020-07-16 LAB — CYTOLOGY - NON PAP

## 2020-07-16 LAB — BASIC METABOLIC PANEL
Anion gap: 8 (ref 5–15)
BUN: 15 mg/dL (ref 8–23)
CO2: 31 mmol/L (ref 22–32)
Calcium: 8.7 mg/dL — ABNORMAL LOW (ref 8.9–10.3)
Chloride: 95 mmol/L — ABNORMAL LOW (ref 98–111)
Creatinine, Ser: 0.5 mg/dL — ABNORMAL LOW (ref 0.61–1.24)
GFR, Estimated: >60 mL/min (ref >60)
Glucose, Bld: 111 mg/dL — ABNORMAL HIGH (ref 70–99)
Potassium: 4.1 mmol/L (ref 3.5–5.1)
Sodium: 134 mmol/L — ABNORMAL LOW (ref 135–145)

## 2020-07-16 LAB — SURGICAL PATHOLOGY

## 2020-07-16 MED ORDER — HYDROXYZINE HCL 25 MG PO TABS
25.0000 mg | ORAL_TABLET | Freq: Four times a day (QID) | ORAL | Status: DC | PRN
  Administered 2020-07-17 – 2020-07-18 (×2): 25 mg via ORAL
  Filled 2020-07-16 (×5): qty 1

## 2020-07-16 NOTE — Anesthesia Postprocedure Evaluation (Signed)
Anesthesia Post Note  Patient: Kevin Baker  Procedure(s) Performed: VIDEO BRONCHOSCOPY WITH ENDOBRONCHIAL NAVIGATION (Left )  Patient location during evaluation: PACU Anesthesia Type: General Level of consciousness: awake and alert Pain management: pain level controlled Vital Signs Assessment: post-procedure vital signs reviewed and stable Respiratory status: spontaneous breathing, nonlabored ventilation, respiratory function stable and patient connected to nasal cannula oxygen Cardiovascular status: blood pressure returned to baseline and stable Postop Assessment: no apparent nausea or vomiting Anesthetic complications: no   No complications documented.   Last Vitals:  Vitals:   07/16/20 0345 07/16/20 0802  BP: 116/73 102/65  Pulse: 86 88  Resp: 18 16  Temp: 36.8 C 36.8 C  SpO2: 99% 100%    Last Pain:  Vitals:   07/16/20 0841  TempSrc:   PainSc: [name]

## 2020-07-16 NOTE — Progress Notes (Signed)
Occupational Therapy Treatment Patient Details Name: Kevin Baker MRN: [id number] DOB: December 28, 1955 Today's Date: 07/16/2020    History of present illness 65 y.o. 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   OT comments  Kevin Baker presents today with generalized weakness, reduced endurance, poor balance, and L LE pain. He is oriented to self and place but appears somewhat confused, restless and slightly manic, talking almost nonstop throughout session. Pt had pulled off his external male catheter, and urine was spilled across the floor. Pt's balance, transfer, and dressing abilities, however, were much improved from OT session in previous week, and pt reported less pain in L LE than during previous session, during which pt displayed guarding behavior and was barely able to move 2/2 pain. Today pt engaged in repeated sit<>stand transfers, using a RW and WB on R LE only, and reported no pain with movement. Recommend continuing OT services during pt's hospitalization, to address weakness, endurance, balance, and his significant history of falls (15+ falls in previous 2 months, according to pt report).   Follow Up Recommendations  SNF    Equipment Recommendations       Recommendations for Other Services      Precautions / Restrictions Precautions Precautions: Fall Restrictions Weight Bearing Restrictions: No       Mobility Bed Mobility Overal bed mobility: Needs Assistance Bed Mobility: Sit to Supine       Sit to supine: Modified independent (Device/Increase time)   General bed mobility comments: Pt uses R UEs to pull L LE into bed     Transfers Overall transfer level: Needs assistance Equipment used: Rolling walker (2 wheeled) Transfers: Sit to/from Stand Sit to Stand: Min assist;From elevated surface         General transfer comment: Pt bears weight only on R LE in standing    Balance Overall balance assessment: Needs assistance Sitting-balance support: Bilateral upper extremity supported Sitting balance-Leahy Scale: Good Sitting balance - Comments: Pt sits at EOB for > 5 min, no LOB     Standing balance-Leahy Scale: Poor Standing balance comment: reliant on walker and R UE, unable to WB through L LE, unable to stand fully upright               High Level Balance Comments: Pt reports having at least 15 falls in the previous 2 months           ADL either performed or assessed with clinical judgement   ADL Overall ADL's : Needs assistance/impaired Eating/Feeding: Set up;Modified independent               Upper Body Dressing : Independent Upper Body Dressing Details (indicate cue type and reason): IND able to don/doff jacket                         Vision Baseline Vision/History: Wears glasses Patient Visual Report: No change from baseline     Perception     Praxis      Cognition Arousal/Alertness: Awake/alert Behavior During Therapy: Restless Overall Cognitive Status: Difficult to assess  General Comments: Pt seems more restless/disoriented than during session last week        Exercises Other Exercises Other Exercises: Educ re: falls prevention   Shoulder Instructions       General Comments      Pertinent Vitals/ Pain       Pain Score: 4  Pain Location: L side of body (primarily LE)  Home Living                                          Prior Functioning/Environment              Frequency  Min 1X/week        Progress Toward Goals  OT Goals(current goals can now be found in the  care plan section)  Progress towards OT goals: Progressing toward goals  Acute Rehab OT Goals Patient Stated Goal: Get back to walking OT Goal Formulation: With patient Time For Goal Achievement: 07/24/20  Plan Discharge plan remains appropriate    Co-evaluation                 AM-PAC OT "6 Clicks" Daily Activity     Outcome Measure   Help from another person eating meals?: None Help from another person taking care of personal grooming?: A Little Help from another person toileting, which includes using toliet, bedpan, or urinal?: A Lot Help from another person bathing (including washing, rinsing, drying)?: A Lot Help from another person to put on and taking off regular upper body clothing?: None Help from another person to put on and taking off regular lower body clothing?: A Lot 6 Click Score: 17    End of Session    OT Visit Diagnosis: Unsteadiness on feet (R26.81);Other abnormalities of gait and mobility (R26.89);Repeated falls (R29.6);Muscle weakness (generalized) (M62.81);Pain Pain - Right/Left: Left Pain - part of body: Hip;Knee;Leg   Activity Tolerance Patient tolerated treatment well   Patient Left in bed;with call bell/phone within reach   Nurse Communication          Time: 0347-4259 OT Time Calculation (min): 11 min  Charges: OT General Charges $OT Visit: 1 Visit OT Treatments $Self Care/Home Management : 8-22 mins  Josiah Lobo, PhD, [place], OTR/L ascom [phone] 07/16/20, 3:27 PM

## 2020-07-16 NOTE — Progress Notes (Signed)
[place]  [phone] [phone]   Name: Kevin Baker Date: 07/16/2020 MRN: [id number]  DOB: [birth year]  Patient Care Team: Patient, No Pcp Per as PCP - General (General Practice)    REASON FOR CONSULTATION: Kevin Baker is a 65 y.o. 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  Patient was hospitalized 05/13/2020 to 05/14/2020 with progressive left groin pain.  He was found to have an inguinal hernia, which was reducible and did not require surgery.  CT of the chest revealed a left lower lobe mass concerning for primary bronchogenic carcinoma.  Patient also had several nodules in the left upper lobe and left posterior costophrenic sulcus.  He ultimately left AMA.  Patient was again hospitalized 07/09/2020 with worsening left hip pain.  CT of the left hip revealed a metastatic lesion in the left trochanter.  Palliative care is consulted to help address goals and manage ongoing symptoms..   CODE STATUS: Full code  PAST MEDICAL HISTORY: Past Medical History:  Diagnosis Date  . Alcohol use disorder, severe, dependence ([place])   . Mass of lower lobe of left lung     PAST SURGICAL HISTORY:  Past Surgical History:  Procedure Laterality Date  . BACK SURGERY    . LEG SURGERY Right   . PELVIC FRACTURE SURGERY    . VIDEO BRONCHOSCOPY WITH ENDOBRONCHIAL NAVIGATION Left 07/12/2020   Procedure: VIDEO BRONCHOSCOPY WITH ENDOBRONCHIAL NAVIGATION;  Surgeon: Ottie Glazier, MD;  Location: [facility] ORS;  Service: Thoracic;  Laterality: Left;    HEMATOLOGY/ONCOLOGY HISTORY:  Oncology History   No history exists.    ALLERGIES:  is allergic to codeine, sulfamethoxazole-trimethoprim, and lactose intolerance (gi).  MEDICATIONS:  Current Facility-Administered Medications  Medication Dose Route Frequency Provider Last  Rate Last Admin  . acetaminophen (TYLENOL) tablet 1,000 mg  1,000 mg Oral TID Ralene Muskrat B, MD   1,000 mg at 07/16/20 0839  . alum & mag hydroxide-simeth (MAALOX/MYLANTA) 200-200-20 MG/5ML suspension 15 mL  15 mL Oral Q4H PRN Ralene Muskrat B, MD   15 mL at 07/12/20 1845  . baclofen (LIORESAL) tablet 10 mg  10 mg Oral TID PRN Lenore Cordia, MD      . buPROPion [facility] SR) 12 hr tablet 150 mg  150 mg Oral Daily Zada Finders R, MD   150 mg at 07/16/20 0840  . dexamethasone (DECADRON) tablet 2 mg  2 mg Oral Q12H Borders, Kirt Boys, NP   2 mg at 07/16/20 0841  . diclofenac Sodium (VOLTAREN) 1 % topical gel 2-4 g  2-4 g Topical QID Lenore Cordia, MD   2 g at 07/16/20 0843  . enoxaparin (LOVENOX) injection 40 mg  40 mg Subcutaneous Q24H Sreenath, Sudheer B, MD      . feeding supplement (ENSURE ENLIVE / ENSURE PLUS) liquid 237 mL  237 mL Oral TID BM Priscella Mann, Sudheer B, MD   237 mL at 07/16/20 0843  . folic acid (FOLVITE) tablet 1 mg  1 mg Oral Daily Sreenath, Sudheer B, MD   1 mg at 07/16/20 0840  . gabapentin (NEURONTIN) capsule 400 mg  400 mg Oral TID Ralene Muskrat B, MD   400 mg at 07/16/20 0840  . hydrOXYzine (VISTARIL) capsule 25 mg  25 mg Oral QID PRN Zada Finders R, MD      . morphine 2 MG/ML injection 2 mg  2 mg Intravenous BID PRN Ralene Muskrat B, MD   2 mg at 07/16/20 0841  . multivitamin with minerals tablet 1 tablet  1 tablet Oral Daily Ralene Muskrat B, MD   1 tablet at 07/16/20 0840  . nicotine (NICODERM CQ - dosed in mg/24 hours) patch 14 mg  14 mg Transdermal Daily Lenore Cordia, MD   14 mg at 07/16/20 0844  . nicotine polacrilex (NICORETTE) gum 2 mg  2 mg Oral PRN Ralene Muskrat B, MD   2 mg at 07/12/20 1046  . ondansetron (ZOFRAN) tablet 4 mg  4 mg Oral Q6H PRN Zada Finders R, MD   4 mg at 07/10/20 2114   Or  . ondansetron (ZOFRAN) injection 4 mg  4 mg Intravenous Q6H PRN Zada Finders R, MD      . oxyCODONE (Oxy IR/ROXICODONE) immediate release  tablet 5-10 mg  5-10 mg Oral Q4H PRN Ralene Muskrat B, MD   10 mg at 07/16/20 0525  . oxyCODONE (OXYCONTIN) 12 hr tablet 15 mg  15 mg Oral Q12H Borders, Kirt Boys, NP   15 mg at 07/16/20 0840  . pantoprazole (PROTONIX) EC tablet 40 mg  40 mg Oral Daily Ralene Muskrat B, MD   40 mg at 07/16/20 0840  . senna-docusate (Senokot-S) tablet 1 tablet  1 tablet Oral QHS PRN Lenore Cordia, MD      . traZODone (DESYREL) tablet 50 mg  50 mg Oral QHS Ralene Muskrat B, MD   50 mg at 07/15/20 2123    VITAL SIGNS: BP 105/70 (BP Location: Right Arm)   Pulse 84   Temp 98.2 F (36.8 C)   Resp 16   Ht 6' (1.829 m)   Wt 129 lb 13.6 oz (58.9 kg)   SpO2 99%   BMI 17.61 kg/m  Filed Weights   07/09/20 1433 07/10/20 1535  Weight: 150 lb (68 kg) 129 lb 13.6 oz (58.9 kg)    Estimated body mass index is 17.61 kg/m as calculated from the following:   Height as of this encounter: 6' (1.829 m).   Weight as of this encounter: 129 lb 13.6 oz (58.9 kg).  LABS: CBC:    Component Value Date/Time   WBC 8.4 07/16/2020 0822   HGB 9.6 (L) 07/16/2020 0822   HCT 28.9 (L) 07/16/2020 0822   PLT 497 (H) 07/16/2020 0822   MCV 100.7 (H) 07/16/2020 0822   NEUTROABS 6.0 07/16/2020 0822   LYMPHSABS 1.6 07/16/2020 0822   MONOABS 0.6 07/16/2020 0822   EOSABS 0.0 07/16/2020 0822   BASOSABS 0.0 07/16/2020 0822   Comprehensive Metabolic Panel:    Component Value Date/Time   NA 134 (L) 07/16/2020 0822   K 4.1 07/16/2020 0822   CL 95 (L) 07/16/2020 0822   CO2 31 07/16/2020 0822   BUN 15 07/16/2020 0822   CREATININE 0.50 (L) 07/16/2020 0822   GLUCOSE 111 (H) 07/16/2020 0822   CALCIUM 8.7 (L) 07/16/2020 0822   AST 19 07/09/2020 2120   ALT 16 07/09/2020 2120   ALKPHOS 66 07/09/2020 2120   BILITOT 0.5 07/09/2020 2120   PROT 7.2 07/09/2020 2120   ALBUMIN 3.4 (L) 07/09/2020 2120    RADIOGRAPHIC STUDIES: MR Lumbar Spine W Wo Contrast  Result Date: 07/11/2020 CLINICAL DATA:  65 year old male with low back  and left lower extremity pain progressed x3 months. Spiculated left lower lobe lung mass in December. EXAM: MRI LUMBAR SPINE WITHOUT AND WITH CONTRAST TECHNIQUE: Multiplanar and multiecho pulse sequences of  the lumbar spine were obtained without and with intravenous contrast. CONTRAST:  12m GADAVIST GADOBUTROL 1 MMOL/ML IV SOLN COMPARISON:  CT Chest, Abdomen, and Pelvis 05/14/2020. FINDINGS: Segmentation:  Normal on the December CT. Alignment: Grade 2 spondylolisthesis of L4 on L5, and grade 1 anterolisthesis of L5 on S1 are stable since December. No associated pars fractures. Underlying mild dextroconvex lumbar scoliosis. Vertebrae: Abnormal decreased T1 signal, STIR hyperintensity and patchy enhancement in the left sacral ala partially visible on series 7, image 11. Elsewhere the visible sacrum and left SI joint appear intact. However, left SI joint ankylosis was demonstrated on the prior CT. Subtle semicircular area of increased STIR signal in the left posterolateral L2 vertebral body (series 7, image 9) demonstrates mild enhancement, and a subtle sclerotic lesion was present here in December. However, axial MRI appearance today raises the possibility of degenerative Schmorl's node (series 8, image 15). The lesion is 16 mm diameter by 8 mm thick. Otherwise normal marrow signal in the visible lower thoracic and lumbar spine. Mild chronic T11 superior endplate compression is stable since December. Conus medullaris and cauda equina: Conus extends to the T12-L1 level. No lower spinal cord or conus signal abnormality. No abnormal intradural enhancement. No dural thickening. Cauda equina nerve roots appear normal. Paraspinal and other soft tissues: Partially visible distended urinary bladder. Trace free fluid in the pelvis. Otherwise negative visible abdominal viscera. Negative lumbar paraspinal soft tissues. Disc levels: Capacious lower thoracic and lumbar spinal canal above L4. L4-L5: Grade 2 spondylolisthesis with  advanced disc and posterior element degeneration. Severe facet hypertrophy greater on the right with degenerative facet joint fluid. Moderate spinal stenosis and bilateral lateral recess stenosis. Moderate to severe bilateral L4 foraminal stenosis. L5-S1: Mild anterolisthesis with disc and posterior element degeneration but no spinal stenosis. There is moderate right L5 foraminal stenosis. IMPRESSION: 1. Partially visible abnormal signal in the left sacral ala with patchy enhancement. This does not seem mass-like, and more resembles a sacral insufficiency fracture than metastatic disease (but see also #2). 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 2. Small 5 x 16 mm enhancing lesion in the L2 posterosuperior vertebral body is indeterminate for small bone Metastasis versus Schmorl's node. Follow-up PET-CT might best characterize further. Failing MAC, short interval 2-3 month follow-up MRI would be most valuable. 3. No other metastatic disease identified in the visible lower thoracic or lumbar spine. 4. Degenerative grade 2 spondylolisthesis at L4-L5 with advanced disc and posterior element degeneration, moderate spinal, lateral recess, and moderate to severe foraminal stenosis. Grade 1 spondylolisthesis at L5-S1 with moderate right foraminal stenosis. Electronically Signed   By: HGenevie AnnM.D.   On: 07/11/2020 08:10   CT Hip Left Wo Contrast  Result Date: 07/10/2020 CLINICAL DATA:  Left leg and arm weakness with pain since March 2021, negative radiographs EXAM: CT OF THE LEFT HIP WITHOUT CONTRAST TECHNIQUE: Multidetector CT imaging of the left hip was performed according to the standard protocol. Multiplanar CT image reconstructions were also generated. COMPARISON:  Radiograph 07/09/2020, CT 05/14/2020 FINDINGS: Bones/Joint/Cartilage The osseous structures appear diffusely demineralized which may limit detection of small or nondisplaced fractures. Ovoid lucent lesion centered within the lesser trochanter  measuring approximately 2.6 x 2.3 x 3.1 cm in size. No clear associated pathologic fracture is evident at this time. No other suspicious lytic or blastic lesions within the included margins of imaging. No other acute bony abnormality. Specifically, no fracture, subluxation, or dislocation of the left hip or included bony pelvis. More remote  remote posttraumatic changes of the left pubic root, superior and inferior rami, unchanged from comparison CT. Ankylosis left SI joint and likely prior traumatic deformity of the left ilium. Degenerative change at the pubic symphysis and left hip is similar to comparison. No sizable hip effusion. Ligaments Suboptimally assessed by CT. Muscles and Tendons No acute musculotendinous abnormality is seen. Soft tissues Atherosclerotic calcifications in the iliac arteries and proximal common and superficial femoral arteries. Circumferential thickening of the urinary bladder though possibly related to underdistention. Fluid-filled appearance of the colon, correlate for rapid transit state. IMPRESSION: 1. Ovoid lucent lesion centered within the left lesser trochanter measuring approximately 2.6 x 2.3 x 3.1 cm in size. No clear associated pathologic fracture is evident at this time. Findings concerning for metastatic lesion given known left lung lesion. 2. Remote posttraumatic changes of the left pubic root, superior and inferior rami, and left ilium unchanged from comparison CT. 3. Ankylosis left SI joint. 4. Circumferential thickening of the urinary bladder though possibly related to underdistention. Correlate with urinalysis to exclude cystitis. 5. Fluid-filled appearance of the colon, could reflect diarrheal illness/rapid transit state. 6. Atherosclerosis These results were called by telephone at the time of interpretation on 07/10/2020 at 12:23 am to provider Dr. Tamala Julian, who verbally acknowledged these results. Electronically Signed   By: Lovena Le M.D.   On: 07/10/2020 00:23   DG  C-Arm 1-60 Min-No Report  Result Date: 07/12/2020 Fluoroscopy was utilized by the requesting physician.  No radiographic interpretation.   DG Hip Unilat W or Wo Pelvis 2-3 Views Left  Result Date: 07/09/2020 CLINICAL DATA:  Acute on chronic hip pain EXAM: DG HIP (WITH OR WITHOUT PELVIS) 2-3V LEFT COMPARISON:  05/13/2020 FINDINGS: Chronic deformity of left iliac bone and pubic rami. No acute displaced fracture or malalignment is seen. There are mild degenerative changes of both hips. Vascular calcifications. IMPRESSION: No acute osseous abnormality. Chronic deformity of left iliac bone and pubic rami. Electronically Signed   By: Donavan Foil M.D.   On: 07/09/2020 21:25   CT Super D Chest W Contrast  Result Date: 07/11/2020 CLINICAL DATA:  Left lower lobe mass, EBUS planning EXAM: CT CHEST WITH CONTRAST TECHNIQUE: Multidetector CT imaging of the chest was performed using thin slice collimation for electromagnetic bronchoscopy planning purposes, with intravenous contrast. CONTRAST:  71m OMNIPAQUE IOHEXOL 300 MG/ML  SOLN COMPARISON:  CT chest, 05/14/2020 FINDINGS: Cardiovascular: Aortic atherosclerosis. Normal heart size. Three-vessel coronary artery calcifications and/or stents. No pericardial effusion. Mediastinum/Nodes: No enlarged mediastinal, hilar, or axillary lymph nodes. Thyroid gland, trachea, and esophagus demonstrate no significant findings. Lungs/Pleura: Slight interval enlargement of a large mass of the superior segment left lower lobe, measuring 4.9 x 4.4 cm, previously 4.6 x 3.8 cm when measured similarly (series 3, image 40). There is postobstructive consolidation and nodularity of the dependent left lower lobe (series 3, image 51). Significant interval enlargement of a spiculated appearing nodule of the anterior left upper lobe measuring 1.4 x 1.2 cm, previously 1.1 x 1.0 cm when measured similarly (series 3, image 37). Unchanged small nodule of the superior segment right lower lobe  measuring 4 mm (series 3, image 27). Slight interval enlargement of a nodule of the peripheral right upper lobe measuring 3 mm, previously no greater than 1-2 mm (series 3, image 35). Minimal centrilobular emphysema. No pleural effusion or pneumothorax. Upper Abdomen: No acute abnormality. Musculoskeletal: No chest wall mass or suspicious bone lesions identified. IMPRESSION: 1. Slight interval enlargement of a large mass of the superior  segment left lower lobe, measuring 4.9 x 4.4 cm, previously 4.6 x 3.8 cm when measured similarly. There is postobstructive consolidation and nodularity of the dependent left lower lobe. Findings are consistent with primary lung malignancy. 2. Significant interval enlargement of a spiculated appearing nodule of the anterior left upper lobe measuring 1.4 x 1.2 cm, previously 1.1 x 1.0 cm when measured similarly. This is concerning for a synchronous primary versus metastasis. 3. Slight interval enlargement of a nodule of the peripheral right upper lobe measuring 3 mm, previously no greater than 1-2 mm. This is again concerning for synchronous primary or metastasis. 4. Unchanged small nodule of the superior segment right lower lobe measuring 4 mm, nonspecific. 5. No mediastinal lymphadenopathy. 6. Minimal centrilobular emphysema. 7. Coronary artery disease. Aortic Atherosclerosis (ICD10-I70.0) and Emphysema (ICD10-J43.9). Electronically Signed   By: Eddie Candle M.D.   On: 07/11/2020 14:10    PERFORMANCE STATUS (ECOG) : 3 - Symptomatic, >50% confined to bed  Review of Systems Unless otherwise noted, a complete review of systems is negative.  Physical Exam General: NAD Pulmonary: Unlabored Extremities: no edema, no joint deformities Skin: no rashes Neurological: Weakness but otherwise nonfocal  IMPRESSION: Follow-up visit  Patient reports that his pain is improved today.  He says he was able to do some exercises in bed and ambulate around the room without being limited  by pain.  I do note that he required 2 doses of IV morphine overnight for breakthrough pain.  He has also required several doses of oxycodone in past 24 hours but total dose is less since starting him on OxyContin. Patient reports that he is trying to limit use of IV morphine due to history of IV illicit drug use.  Patient is hopeful that he will be able to have rehab following discharge from the hospital but disposition remains unclear.  He will require transportation back and forth to the [facility] as he is unable to drive due to a past DWI. He will require use of the [facility] service.   PLAN: -Continue current scope of treatment -Continue OxyContin/oxycodone IR for pain -Continue dexamethasone -Continue daily bowel regimen   Time Total: 15 minutes  Visit consisted of counseling and education dealing with the complex and emotionally intense issues of symptom management and palliative care in the setting of serious and potentially life-threatening illness.Greater than 50%  of this time was spent counseling and coordinating care related to the above assessment and plan.  Signed by: Altha Harm, PhD, NP-C

## 2020-07-16 NOTE — Progress Notes (Signed)
Nutrition Follow-up  DOCUMENTATION CODES:   Severe malnutrition in context of chronic illness  INTERVENTION:  Continue Ensure Enlive po TID, each supplement provides 350 kcal and 20 grams of protein.  NUTRITION DIAGNOSIS:   Severe Malnutrition related to chronic illness (etoh abuse, lung mass with suspected malignancy) as evidenced by severe muscle depletion,severe fat depletion.  Ongoing.  GOAL:   Patient will meet greater than or equal to 90% of their needs  Met.  MONITOR:   PO intake,Supplement acceptance,Labs,Weight trends,Skin,I & O's  REASON FOR ASSESSMENT:   Malnutrition Screening Tool    ASSESSMENT:   65 y.o. 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 Plan is for palliative radiation.  Met with patient at bedside. He reports his appetite is better than ever. He is now eating 75-100% of his meals. He reports he is able to pick out things he can chew from regular diet and wants to continue this diet. He reports he is drinking Ensure Enlive po TID and wants to continue drinking these. Patient now meeting >100% estimated kcal and protein needs.  Medications reviewed and include: Decadron 2 mg [id number] PO, folic acid 1 mg daily, MVI daily, nicotine patch, Protonix.  Labs reviewed: Sodium 134, Chloride 95, Creatinine 0.5.  I/O: 1375 mL UOP Yesterday (1 mL/kg/hr) + 2 occurrences unmeasured UOP  No weight to trend since 2/23  Diet Order:   Diet Order            Diet regular Room service appropriate? Yes; Fluid consistency: Thin  Diet effective now                EDUCATION  NEEDS:   Education needs have been addressed  Skin:  Skin Assessment: Reviewed RN Assessment  Last BM:  07/15/2020 - medium type 6  Height:   Ht Readings from Last 1 Encounters:  07/09/20 6' (1.829 m)   Weight:   Wt Readings from Last 1 Encounters:  07/10/20 58.9 kg   Ideal Body Weight:  80.9 kg  BMI:  Body mass index is 17.61 kg/m.  Estimated Nutritional Needs:   Kcal:  1800-2100kcal/day  Protein:  90-105g/day  Fluid:  1.7-2.0L/day  Jacklynn Barnacle, MS, RD, LDN Pager number available on Amion

## 2020-07-16 NOTE — Progress Notes (Signed)
PROGRESS NOTE    Kevin Baker  VOZ:[id number] DOB: 02/22/1956 DOA: 07/09/2020 PCP: Patient, No Pcp Per   Brief Narrative:  Patient with history of alcohol use disorder, tobacco use, hepatitis C, chronic gastritis, chronic pain who presents for evaluation of worsening left hip pain and difficulty ambulating.  He was previously seen in the hospital in December 2021 for similar symptoms.  At that time the primary bronchogenic carcinoma was made aware to him and he was recommended to stay for further evaluation and management however during that admission he elected to leave [place].  He was seen by outpatient orthopedics and was given a prescription for Neurontin for left hip pain and a referral to neurology.  Patient is not followed up.  Presented to ED with progressive worsening hip pain.  I have communicated with oncology who agreed to consult on the patient.  Recommendations appreciated.  Per oncology requesting orthopedic evaluation for surgical fixation of a osseous lesion in the left femur.  Palliative care also involved.  Orthopedics consulted, not recommending surgical fixation at this time.  Communicated with medical oncology and pulmonology.  We will plan for bronchoscopy/EBUS for definitive diagnosis on 2/25.  Radiation oncology contacted for palliative radiation.  Patient underwent successful bronchoscopy/transbronchial biopsy on 2/25.  Results will be available in approximately 5 days.  Patient was also seen by neurosurgery at the request orthopedist.  No neurosurgical intervention warranted at this time.  2/28: Patient scheduled for CT simulation in preparation for palliative radiation.  Still endorsing substantial pain though report of pain level is not in correlation with physical exam and general appearance  3/1: Patient underwent CT stimulation yesterday 2/28.  Pain control slowly improving.  Disposition continues to be an issue as patient does not have reliable  transportation or good family support.  Case management aware.  We need to find details on radiation therapy prior to formulation of a disposition plan.   Assessment & Plan:   Principal Problem:   Acute on chronic pain of left hip Active Problems:   Mass of lower lobe of left lung   Hypokalemia   Hypomagnesemia   Normocytic anemia   Palliative care encounter   Protein-calorie malnutrition, severe   Cancer associated pain  Acute on chronic left hip pain with generalized weakness and frequent falls Degenerative joint disease of the left hip: CT imaging of the left hip concerning for metastatic lesion at the left lesser trochanter.  Also shows remote posttraumatic changes of the left pubic root, superior and inferior rami, left ilium and ankylosis at the left SI joint. -Oncology consult requested -Orthopedic consult requested -Radiation oncology requested Plan: Patient underwent CT simulation yesterday.  I suspect that radiation will be more effective than narcotics for pain control.  Unfortunately this creates a problem with disposition as he would likely benefit from placement at a rehab facility.  We will need to coordinate with medical oncology and radiation oncology to determine what his radiation schedule look like.  Then we may be able to place him for short-term rehab.  Continue multimodal pain control at this time.  Avoid IV narcotics if able.   Left lower lobe lung mass: 4.6 x 3.8 spiculated mass seen at the left lower lobe on CT imaging 05/14/2020 concerning for primary bronchogenic carcinoma.  Has not had outpatient follow-up for this yet.  Discussed findings with patient. -Status post transbronchial biopsy on 2/25.  Appreciate pulmonology involvement.   -Per pathology results are consistent with poorly differentiated adenocarcinoma of  the lung -Medical oncology, Dr. Rogue Bussing is aware and following  Alcohol use disorder: Reports he quit drinking about 2 months ago.  Not  currently exhibiting any signs of withdrawal.  Continue CIWA checks without Ativan for now.  Normocytic anemia: Without obvious bleeding.   No indication for transfusion Iron indices indicate anemia of chronic disease  Hypokalemia/hypomagnesemia: Monitor replace as necessary  Tobacco use: Reports cutting back to 1.5 PPD per week.  Patient strongly advised to quit smoking completely given likely bronchogenic cancer.  Nicotine patch and lozenges ordered.   DVT prophylaxis: SQ Lovenox Code Status: Full Family Communication: None today Disposition Plan: Status is: Inpatient  Remains inpatient appropriate because:Inpatient level of care appropriate due to severity of illness   Dispo: The patient is from: Home              Anticipated d/c is to: SNF              Patient currently is not medically stable to d/c.   Difficult to place patient Yes   Pain control slowly improving.  Palliative care following for pain control management.  CT stimulation performed yesterday.  Radiation likely to start midweek.  This will present a problem with disposition as patient has unreliable transportation and likely will benefit from placement in short-term rehab.  Disposition plan unclear    Level of care: Med-Surg  Consultants:   Oncology  Orthopedics  Palliative care  Pulmonology  Procedures:   None  Antimicrobials:   None   Subjective: Patient seen and examined.  Improved pain control.  Tolerating p.o. intake.  Objective: Vitals:   07/15/20 2330 07/16/20 0345 07/16/20 0802 07/16/20 1146  BP: (!) 89/54 116/73 102/65 105/70  Pulse: 85 86 88 84  Resp: 16 18 16 16   Temp: (!) 97.5 F (36.4 C) 98.2 F (36.8 C) 98.3 F (36.8 C) 98.2 F (36.8 C)  TempSrc:      SpO2: 97% 99% 100% 99%  Weight:      Height:        Intake/Output Summary (Last 24 hours) at 07/16/2020 1501 Last data filed at 07/16/2020 1016 Gross per 24 hour  Intake 480 ml  Output 1150 ml  Net -670 ml    Filed Weights   07/09/20 1433 07/10/20 1535  Weight: 68 kg 58.9 kg    Examination:  General exam: Appears calm and comfortable  Respiratory system: Decreased air entry.  Scattered coarse crackles bilaterally.  Normal work of breathing.  Room air Cardiovascular system: S1 & S2 heard, RRR. No JVD, murmurs, rubs, gallops or clicks. No pedal edema. Gastrointestinal system: Abdomen is nondistended, soft and nontender. No organomegaly or masses felt. Normal bowel sounds heard. Central nervous system: Alert and oriented. No focal neurological deficits. Extremities: Markedly decreased power left lower extremity Skin: No rashes, lesions or ulcers Psychiatry: Judgement and insight appear normal. Mood & affect appropriate.     Data Reviewed: I have personally reviewed following labs and imaging studies  CBC: Recent Labs  Lab 07/09/20 1513 07/10/20 0608 07/12/20 0923 07/14/20 0743 07/16/20 0822  WBC 8.6 8.4 10.2 9.0 8.4  NEUTROABS  --   --  8.3* 6.5 6.0  HGB 9.9* 10.4* 10.3* 10.7* 9.6*  HCT 28.7* 29.1* 29.2* 30.6* 28.9*  MCV 97.0 93.9 96.4 97.8 100.7*  PLT 509* 501* 509* 506* 784*   Basic Metabolic Panel: Recent Labs  Lab 07/09/20 1513 07/09/20 2313 07/10/20 6962 07/12/20 0923 07/14/20 0743 07/16/20 0822  NA 133*  --  133* 132* 133* 134*  K 3.0*  --  3.1* 3.3* 3.9 4.1  CL 98  --  100 98 94* 95*  CO2 25  --  24 25 31 31   GLUCOSE 103*  --  90 118* 105* 111*  BUN 26*  --  19 13 12 15   CREATININE 1.03  --  0.88 0.65 0.53* 0.50*  CALCIUM 9.4  --  9.1 8.8* 8.8* 8.7*  MG  --  1.1* 1.8  --   --   --   PHOS  --   --  2.0*  --   --   --    GFR: Estimated Creatinine Clearance: 76.7 mL/min (A) (by C-G formula based on SCr of 0.5 mg/dL (L)). Liver Function Tests: Recent Labs  Lab 07/09/20 2120  AST 19  ALT 16  ALKPHOS 66  BILITOT 0.5  PROT 7.2  ALBUMIN 3.4*   Recent Labs  Lab 07/09/20 2120  LIPASE 39   No results for input(s): AMMONIA in the last 168  hours. Coagulation Profile: No results for input(s): INR, PROTIME in the last 168 hours. Cardiac Enzymes: No results for input(s): CKTOTAL, CKMB, CKMBINDEX, TROPONINI in the last 168 hours. BNP (last 3 results) No results for input(s): PROBNP in the last 8760 hours. HbA1C: No results for input(s): HGBA1C in the last 72 hours. CBG: No results for input(s): GLUCAP in the last 168 hours. Lipid Profile: No results for input(s): CHOL, HDL, LDLCALC, TRIG, CHOLHDL, LDLDIRECT in the last 72 hours. Thyroid Function Tests: No results for input(s): TSH, T4TOTAL, FREET4, T3FREE, THYROIDAB in the last 72 hours. Anemia Panel: No results for input(s): VITAMINB12, FOLATE, FERRITIN, TIBC, IRON, RETICCTPCT in the last 72 hours. Sepsis Labs: No results for input(s): PROCALCITON, LATICACIDVEN in the last 168 hours.  Recent Results (from the past 240 hour(s))  Resp Panel by RT-PCR (Flu A&B, Covid) Nasopharyngeal Swab     Status: None   Collection Time: 07/09/20 11:13 PM   Specimen: Nasopharyngeal Swab; Nasopharyngeal(NP) swabs in vial transport medium  Result Value Ref Range Status   SARS Coronavirus 2 by RT PCR NEGATIVE NEGATIVE Final    Comment: (NOTE) SARS-CoV-2 target nucleic acids are NOT DETECTED.  The SARS-CoV-2 RNA is generally detectable in upper respiratory specimens during the acute phase of infection. The lowest concentration of SARS-CoV-2 viral copies this assay can detect is 138 copies/mL. A negative result does not preclude SARS-Cov-2 infection and should not be used as the sole basis for treatment or other patient management decisions. A negative result may occur with  improper specimen collection/handling, submission of specimen other than nasopharyngeal swab, presence of viral mutation(s) within the areas targeted by this assay, and inadequate number of viral copies(<138 copies/mL). A negative result must be combined with clinical observations, patient history, and  epidemiological information. The expected result is Negative.  Fact Sheet for Patients:  EntrepreneurPulse.com.au  Fact Sheet for Healthcare Providers:  IncredibleEmployment.be  This test is no t yet approved or cleared by the [place] FDA and  has been authorized for detection and/or diagnosis of SARS-CoV-2 by FDA under an Emergency Use Authorization (EUA). This EUA will remain  in effect (meaning this test can be used) for the duration of the COVID-19 declaration under Section 564(b)(1) of the Act, 21 U.S.C.section 360bbb-3(b)(1), unless the authorization is terminated  or revoked sooner.       Influenza A by PCR NEGATIVE NEGATIVE Final   Influenza B by PCR NEGATIVE NEGATIVE Final    Comment: (  NOTE) The Xpert Xpress SARS-CoV-2/FLU/RSV plus assay is intended as an aid in the diagnosis of influenza from Nasopharyngeal swab specimens and should not be used as a sole basis for treatment. Nasal washings and aspirates are unacceptable for Xpert Xpress SARS-CoV-2/FLU/RSV testing.  Fact Sheet for Patients: EntrepreneurPulse.com.au  Fact Sheet for Healthcare Providers: IncredibleEmployment.be  This test is not yet approved or cleared by the [place] FDA and has been authorized for detection and/or diagnosis of SARS-CoV-2 by FDA under an Emergency Use Authorization (EUA). This EUA will remain in effect (meaning this test can be used) for the duration of the COVID-19 declaration under Section 564(b)(1) of the Act, 21 U.S.C. section 360bbb-3(b)(1), unless the authorization is terminated or revoked.  Performed at [facility], [address], [place], [place] [address]          Radiology Studies: No results found.      Scheduled Meds: . acetaminophen  1,000 mg Oral TID  . buPROPion  150 mg Oral Daily  . dexamethasone  2 mg Oral Q12H  . diclofenac Sodium  2-4 g Topical QID  .  enoxaparin (LOVENOX) injection  40 mg Subcutaneous Q24H  . feeding supplement  237 mL Oral TID BM  . folic acid  1 mg Oral Daily  . gabapentin  400 mg Oral TID  . multivitamin with minerals  1 tablet Oral Daily  . nicotine  14 mg Transdermal Daily  . oxyCODONE  15 mg Oral Q12H  . pantoprazole  40 mg Oral Daily  . traZODone  50 mg Oral QHS   Continuous Infusions:    LOS: 5 days    Time spent: 15 minutes    Sidney Ace, MD [facility] Pager [phone]  If 7PM-7AM, please contact night-coverage 07/16/2020, 3:01 PM

## 2020-07-17 DIAGNOSIS — C3492 Malignant neoplasm of unspecified part of left bronchus or lung: Secondary | ICD-10-CM | POA: Diagnosis not present

## 2020-07-17 DIAGNOSIS — C3412 Malignant neoplasm of upper lobe, left bronchus or lung: Secondary | ICD-10-CM

## 2020-07-17 DIAGNOSIS — D649 Anemia, unspecified: Secondary | ICD-10-CM

## 2020-07-17 DIAGNOSIS — G893 Neoplasm related pain (acute) (chronic): Secondary | ICD-10-CM

## 2020-07-17 DIAGNOSIS — M25552 Pain in left hip: Secondary | ICD-10-CM | POA: Diagnosis not present

## 2020-07-17 DIAGNOSIS — E43 Unspecified severe protein-calorie malnutrition: Secondary | ICD-10-CM

## 2020-07-17 DIAGNOSIS — C3490 Malignant neoplasm of unspecified part of unspecified bronchus or lung: Secondary | ICD-10-CM

## 2020-07-17 NOTE — Plan of Care (Signed)
°  Problem: Clinical Measurements: °Goal: Ability to maintain clinical measurements within normal limits will improve °Outcome: Progressing °Goal: Will remain free from infection °Outcome: Progressing °Goal: Diagnostic test results will improve °Outcome: Progressing °Goal: Respiratory complications will improve °Outcome: Progressing °Goal: Cardiovascular complication will be avoided °Outcome: Progressing °  °Problem: Coping: °Goal: Level of anxiety will decrease °Outcome: Progressing °  °

## 2020-07-17 NOTE — Assessment & Plan Note (Deleted)
65 year old male patient history of smoking/alcohol is currently in the hospital for worsening left-sided hip pain; noted to have a large left lung mass.  #Stage IV lung cancer -left lower lung mass with metastasis to left hip -awaiting foundation 1 testing for consideration of systemic therapy.  #Left hip pain-soft tissue lesions noted in the femoral area likely secondary to metastatic disease.  S/p radiation simulation plan radiation starting tomorrow 3/3.  Continue pain control as per palliative care.  #Disposition-patient awaiting placement.  Patient is stable from oncology standpoint for discharge.

## 2020-07-17 NOTE — Progress Notes (Signed)
Physical Therapy Treatment Patient Details Name: Kevin Baker MRN: [id number] DOB: June 30, 1955 Today's Date: 07/17/2020    History of Present Illness 65 y.o. 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    PT Comments    Pt still with some restlessness and hesitancy to fully use L LE, but is vastly more mobile and tolerant of activity versus previous sessions.  He was able to get to sitting EOB and get foot to the floor (this took multiple minutes just to attempt hip flexion down to 90 last week).  He reports he has been able to get up and use the commode and was eager to show how well he is moving now that pain is more controlled, diarrhea is more controlled and he is eating more... he did ultimately walk >60 ft with reasonably confident cadence and safety.  Still L LE limited but improved vastly.     Follow Up Recommendations  SNF;Supervision/Assistance - 24 hour (per continued improvement he may be able to transition home with assist)     Equipment Recommendations  Rolling walker with 5" wheels (has rollator, states it does not fit through some doors in the home)    Recommendations for Other Services       Precautions / Restrictions Precautions Precautions: Fall Restrictions Weight Bearing Restrictions: No    Mobility  Bed Mobility Overal bed mobility: Needs Assistance Bed Mobility: Sit to Supine     Supine to sit: Min guard Sit to supine: Modified independent (Device/Increase time)   General bed mobility comments: Pt still having to use UEs a little to help move L LE, but exceedingly better QofM and tolerance this date than prior PT sessions     Transfers Overall transfer level: Needs assistance Equipment used: Rolling walker (2 wheeled) Transfers: Sit to/from Stand Sit to Stand: Min guard         General transfer comment: Pt able to effectively take weight through L LE and rose to standing w/o direct assist.  Still reliant on UEs and guarded but vastly different than attempts at standing last week  Ambulation/Gait Ambulation/Gait assistance: Min guard Gait Distance (Feet): 65 Feet Assistive device: Rolling walker (2 wheeled)       General Gait Details: Pt was able to ambulate in the room with relative confidence.  He still showed some hesitation with L WBing, but never the less was able to weight bear through it and using walker maintained consistent cadence w/o overt safety issues.   Stairs             Wheelchair Mobility    Modified Rankin (Stroke Patients Only)       Balance Overall balance assessment: Needs assistance Sitting-balance support: Bilateral upper extremity supported Sitting balance-Leahy Scale: Good     Standing balance support: Bilateral upper extremity supported Standing balance-Leahy Scale: Fair Standing balance comment: reliant on walker able to WB through L LE                            Cognition Arousal/Alertness: Awake/alert Behavior During Therapy: Restless;WFL for tasks assessed/performed Overall Cognitive Status: Within Functional Limits for tasks assessed  General Comments: Pt able to hold more consistent conversation and stay on task better than previous sessions      Exercises General Exercises - Lower Extremity Ankle Circles/Pumps: Strengthening;10 reps Long Arc Quad: AAROM;AROM;10 reps (still very guarded on the L) Heel Slides: AROM;Strengthening;10 reps (hesitant on L) Hip ABduction/ADduction: Strengthening;10 reps Hip Flexion/Marching: AAROM;AROM;10 reps (AAROM on L, unable to maintain against  gravity)    General Comments        Pertinent Vitals/Pain Pain Assessment: 0-10 Pain Score: 2  Pain Location: L LE    Home Living                      Prior Function            PT Goals (current goals can now be found in the care plan section) Progress towards PT goals: Progressing toward goals    Frequency    Min 2X/week      PT Plan Current plan remains appropriate    Co-evaluation              AM-PAC PT "6 Clicks" Mobility   Outcome Measure  Help needed turning from your back to your side while in a flat bed without using bedrails?: None Help needed moving from lying on your back to sitting on the side of a flat bed without using bedrails?: None Help needed moving to and from a bed to a chair (including a wheelchair)?: A Little Help needed standing up from a chair using your arms (e.g., wheelchair or bedside chair)?: A Little Help needed to walk in hospital room?: A Little Help needed climbing 3-5 steps with a railing? : A Lot 6 Click Score: 19    End of Session Equipment Utilized During Treatment: Gait belt Activity Tolerance: Patient limited by pain Patient left: with bed alarm set;with call bell/phone within reach Nurse Communication: Mobility status PT Visit Diagnosis: Muscle weakness (generalized) (M62.81);Difficulty in walking, not elsewhere classified (R26.2);Pain Pain - Right/Left: Left Pain - part of body: Hip     Time: 4680-3212 PT Time Calculation (min) (ACUTE ONLY): 26 min  Charges:  $Gait Training: 8-22 mins $Therapeutic Exercise: 8-22 mins                     Kreg Shropshire, DPT 07/17/2020, 4:17 PM

## 2020-07-17 NOTE — Progress Notes (Signed)
PROGRESS NOTE  Kevin Baker NGE:[id number] DOB: Feb 24, 1956 DOA: 07/09/2020 PCP: Patient, No Pcp Per  Brief History   65 year old man presented with left hip pain and difficulty ambulating.  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  Plans were made for radiation.  A & P  Left-sided lung cancer with metastatic disease to the left lesser trochanteric resulting in significant left hip pain and ambulatory dysfunction with left leg weakness. --Plan for XRT to begin 3/3 per patient --Continue pain control --continue PT, improving, may be able to transition home  Alcohol use disorder, in early remission --No signs or symptoms of withdrawal.  Normocytic anemia --Probably anemia of chronic disease, Hgb stable.  Smoker --Recommend cessation  Severe malnutrition --Dietitian consult appreciated   Disposition Plan:  Discussion: as above  Status is: Inpatient  Remains inpatient appropriate because:Inpatient level of care appropriate due to severity of illness   Dispo: The patient is from: Home              Anticipated d/c is to: TBD              Patient currently is not medically stable to d/c.   Difficult to place patient No  DVT prophylaxis: enoxaparin (LOVENOX) injection 40 mg Start: 07/12/20 2200 Place and maintain sequential compression device Start: 07/10/20 1620   Code Status: Full Code Level of care: Med-Surg Family Communication: none  Murray Hodgkins, MD  [facility] Direct contact: see [email or web address] (further directions at bottom of note if needed) 7PM-7AM contact night coverage as at bottom of note 07/17/2020, 6:41 PM  LOS: 6 days   Significant Hospital Events   .    Consults:  .    Procedures:  .   Significant Diagnostic Tests:  [name]    Micro Data:  .    Antimicrobials:  .   Interval History/Subjective  CC: f/u left leg pain  Feels  better, less pain, moving leg better but still weak Has first radiation treatment tomorrow.  Objective   Vitals:  Vitals:   07/17/20 1116 07/17/20 1533  BP: 98/73 101/65  Pulse: 88 83  Resp: 17 18  Temp: 98.4 F (36.9 C) 98.3 F (36.8 C)  SpO2: 98% 99%    Exam:  Constitutional:   . Appears calm and comfortable ENMT:  . grossly normal hearing  Respiratory:  . CTA bilaterally, no w/r/r.  . Respiratory effort normal.  Cardiovascular:  . RRR, no m/r/g . No LE extremity edema   Musculoskeletal:  . Decreased muscle mass . Can move leg left but can't lift off bed Psychiatric:  . Mental status o Mood, affect appropriate  I have personally reviewed the following:   Today's Data  . BMP unremarkable . Hgb stable at 9.6  Scheduled Meds: . acetaminophen  1,000 mg Oral TID  . buPROPion  150 mg Oral Daily  . dexamethasone  2 mg Oral Q12H  . diclofenac Sodium  2-4 g Topical QID  . enoxaparin (LOVENOX) injection  40 mg Subcutaneous Q24H  . feeding supplement  237 mL Oral TID BM  . folic acid  1 mg Oral Daily  . gabapentin  400 mg Oral TID  . multivitamin with minerals  1 tablet Oral Daily  . nicotine  14 mg Transdermal Daily  . oxyCODONE  15 mg Oral Q12H  . pantoprazole  40 mg Oral Daily  . traZODone  50 mg Oral QHS  Continuous Infusions:  Principal Problem:   Metastatic lung cancer (metastasis from lung to other site) [facility]) Active Problems:   Acute on chronic pain of left hip   Hypokalemia   Hypomagnesemia   Normocytic anemia   Palliative care encounter   Protein-calorie malnutrition, severe   Cancer associated pain   LOS: 6 days   How to contact the [facility] Attending or Consulting provider [place] or covering provider during after hours [place], for this patient?  1. Check the care team in [facility] and look for a) attending/consulting [facility] provider listed and b) the [facility] team listed 2. Log into [email or web address] and use [place]'s universal password to access. If you do  not have the password, please contact the hospital operator. 3. Locate the [facility] provider you are looking for under [facility] and page to a number that you can be directly reached. 4. If you still have difficulty reaching the provider, please page the [facility] (Director on Call) for the Hospitalists listed on amion for assistance.

## 2020-07-17 NOTE — Hospital Course (Addendum)
65 year old man presented with left hip pain and difficulty ambulating.  Patient was seen by orthopedics who recommended against any operative intervention for left trochanteric lesion.  Seen by neurosurgery for abnormalities in the lumbar spine with recommendations for outpatient follow-up with you response to treatment.  Seen by pulmonology, underwent bronchoscopy.  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  Seen by oncology and radiation oncology.  Plan is for radiation to begin 3/7.  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  Seen by neurosurgery, not a good candidate for elective back surgery secondary to metastatic lung cancer. --Follow-up with neurosurgery Dr. Cari Caraway as an outpatient to consider intervention if patient's health improves with treatment. --ambulatory referral to palliative care  Alcohol use disorder, in early remission --No signs or symptoms of withdrawal.  Normocytic anemia --Probably anemia of chronic disease, Hgb stable.  Smoker --Recommend cessation  Severe malnutrition --Dietitian consult appreciated  Aortic atherosclerosis  emphysema  Small 5 x 16 mm enhancing lesion in the L2 posterosuperior vertebral body is indeterminate for small bone Metastasis versus Schmorl's node. Follow-up PET-CT might best characterize further. Failing MAC, short interval 2-3 month follow-up MRI would be most valuable.  Degenerative grade 2 spondylolisthesis at L4-L5 with advanced disc and posterior element degeneration,   Borderline orthostasis

## 2020-07-17 NOTE — Progress Notes (Signed)
Kevin Baker   DOB:[birth year]   FY#:[id number]    Subjective: Patient states his pain is better controlled.  He is interested in ambulation.   Objective:  Vitals:   07/17/20 1533 07/17/20 1949  BP: 101/65 104/65  Pulse: 83 81  Resp: 18 17  Temp: 98.3 F (36.8 C) 98.4 F (36.9 C)  SpO2: 99% 98%     Intake/Output Summary (Last 24 hours) at 07/17/2020 2032 Last data filed at 07/17/2020 1130 Gross per 24 hour  Intake -  Output 2410 ml  Net -2410 ml    Physical Exam Constitutional:      Comments: Thin cachectic male patient.  No acute distress.  HENT:     Head: Normocephalic and atraumatic.     Mouth/Throat:     Mouth: Oropharynx is clear and moist.     Pharynx: No oropharyngeal exudate.  Eyes:     Pupils: Pupils are equal, round, and reactive to light.  Cardiovascular:     Rate and Rhythm: Normal rate and regular rhythm.  Pulmonary:     Effort: No respiratory distress.     Breath sounds: No wheezing.     Comments: Decreased breath sounds bilaterally.  Abdominal:     General: Bowel sounds are normal. There is no distension.     Palpations: Abdomen is soft. There is no mass.     Tenderness: There is no abdominal tenderness. There is no guarding or rebound.  Musculoskeletal:        General: No tenderness or edema. Normal range of motion.     Cervical back: Normal range of motion and neck supple.  Skin:    General: Skin is warm.  Neurological:     Mental Status: He is alert and oriented to person, place, and time.  Psychiatric:        Mood and Affect: Affect normal.     Labs:  Lab Results  Component Value Date   WBC 8.4 07/16/2020   HGB 9.6 (L) 07/16/2020   HCT 28.9 (L) 07/16/2020   MCV 100.7 (H) 07/16/2020   PLT 497 (H) 07/16/2020   NEUTROABS 6.0 07/16/2020    Lab Results  Component Value Date   NA 134 (L) 07/16/2020   K 4.1 07/16/2020   CL 95 (L) 07/16/2020   CO2 31 07/16/2020    Studies:  No results found.  65 year old male patient history of  smoking/alcohol is currently in the hospital for worsening left-sided hip pain; noted to have a large left lung mass.  #Stage IV lung cancer -left lower lung mass with metastasis to left hip -awaiting foundation 1 testing for consideration of systemic therapy.  #Left hip pain-soft tissue lesions noted in the femoral area likely secondary to metastatic disease.  S/p radiation simulation plan radiation starting tomorrow 3/3.  Continue pain control as per palliative care.  #Disposition-patient awaiting placement.  Patient is stable from oncology standpoint for discharge.   Cammie Sickle, MD 07/17/2020  8:32 PM

## 2020-07-18 ENCOUNTER — Ambulatory Visit: Payer: Medicare HMO

## 2020-07-18 DIAGNOSIS — C3490 Malignant neoplasm of unspecified part of unspecified bronchus or lung: Secondary | ICD-10-CM

## 2020-07-18 DIAGNOSIS — G893 Neoplasm related pain (acute) (chronic): Secondary | ICD-10-CM | POA: Diagnosis not present

## 2020-07-18 DIAGNOSIS — M25552 Pain in left hip: Secondary | ICD-10-CM | POA: Diagnosis not present

## 2020-07-18 DIAGNOSIS — E43 Unspecified severe protein-calorie malnutrition: Secondary | ICD-10-CM | POA: Diagnosis not present

## 2020-07-18 NOTE — Progress Notes (Addendum)
PROGRESS NOTE  Kevin Baker DQQ:[id number] DOB: May 01, 1956 DOA: 07/09/2020 PCP: Patient, No Pcp Per  Brief History   65 year old man presented with left hip pain and difficulty ambulating.  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  Plans were made for radiation.  A & P  Left-sided lung cancer with metastatic disease to the left lesser trochanteric resulting in significant left hip pain and ambulatory dysfunction with left leg weakness. --overall improving, may be able to go home tomorrow w/ HHPT --awaiting start of XRT, pt thought was to start today, will d/w Radiation Oncology tomorrow.  Alcohol use disorder, in early remission --No signs or symptoms of withdrawal.  Normocytic anemia --Probably anemia of chronic disease, Hgb stable.  Smoker --Recommend cessation  Severe malnutrition --Dietitian consult appreciated   Disposition Plan:  Discussion: improving, possibly home tomorrow if progressing w/ physical therapy.  Status is: Inpatient  Remains inpatient appropriate because:Inpatient level of care appropriate due to severity of illness   Dispo: The patient is from: Home              Anticipated d/c is to: TBD              Patient currently is not medically stable to d/c.   Difficult to place patient No  DVT prophylaxis: enoxaparin (LOVENOX) injection 40 mg Start: 07/12/20 2200 Place and maintain sequential compression device Start: 07/10/20 1620   Code Status: Full Code Level of care: Med-Surg Family Communication: discussed with brother Linna Hoff who he will go home with  Murray Hodgkins, MD  [facility] Direct contact: see [email or web address] (further directions at bottom of note if needed) 7PM-7AM contact night coverage as at bottom of note 07/18/2020, 5:12 PM  LOS: 7 days   Significant Hospital Events   .    Consults:  .    Procedures:   .   Significant Diagnostic Tests:  [name]    Micro Data:  .    Antimicrobials:  .   Interval History/Subjective  CC: f/u left leg pain  "I feel great" Still has left leg pain and weakness, long-standing. Did will with therapy yesterday, walked 65 feet.  Objective   Vitals:  Vitals:   07/18/20 1108 07/18/20 1535  BP: 101/83 109/70  Pulse: 85 92  Resp: 17 16  Temp: 98.6 F (37 C) 99 F (37.2 C)  SpO2: 100% 98%    Exam:  Constitutional:   . Appears calm and comfortable ENMT:  . grossly normal hearing  Respiratory:  . CTA bilaterally, no w/r/r.  . Respiratory effort normal. Cardiovascular:  . RRR, no m/r/g . No LE extremity edema   Psychiatric:  . Mental status o Mood, affect appropriate Musculoskeletal: moves left leg but has difficulty lifting.  I have personally reviewed the following:   Today's Data  . No new data  Scheduled Meds: . acetaminophen  1,000 mg Oral TID  . buPROPion  150 mg Oral Daily  . dexamethasone  2 mg Oral Q12H  . diclofenac Sodium  2-4 g Topical QID  . enoxaparin (LOVENOX) injection  40 mg Subcutaneous Q24H  . feeding supplement  237 mL Oral TID BM  . folic acid  1 mg Oral Daily  . gabapentin  400 mg Oral TID  . multivitamin with minerals  1 tablet Oral Daily  . nicotine  14 mg Transdermal Daily  . oxyCODONE  15 mg Oral Q12H  . pantoprazole  40 mg Oral Daily  . traZODone  50 mg Oral QHS   Continuous Infusions:  Active Problems:   Acute on chronic pain of left hip   Hypokalemia   Hypomagnesemia   Normocytic anemia   Palliative care encounter   Protein-calorie malnutrition, severe   Cancer associated pain   Primary cancer of left upper lobe of lung ([place])   LOS: 7 days   How to contact the [facility] Attending or Consulting provider [place] or covering provider during after hours [place], for this patient?  1. Check the care team in [facility] and look for a) attending/consulting TRH provider listed and b) the [facility] team listed 2. Log  into [email or web address] and use [place]'s universal password to access. If you do not have the password, please contact the hospital operator. 3. Locate the [facility] provider you are looking for under [facility] and page to a number that you can be directly reached. 4. If you still have difficulty reaching the provider, please page the [facility] (Director on Call) for the Hospitalists listed on amion for assistance.

## 2020-07-18 NOTE — Care Management Important Message (Signed)
Important Message  Patient Details  Name: Kevin Baker MRN: [id number] Date of Birth: May 17, 1956   Medicare Important Message Given:  Yes     Juliann Pulse A Lendell Gallick 07/18/2020, 1:37 PM

## 2020-07-18 NOTE — TOC Progression Note (Signed)
Transition of Care [facility]) - Progression Note    Patient Details  Name: Elieser Tetrick MRN: [id number] Date of Birth: 1956/01/12  Transition of Care [facility]) CM/SW [place], RN Phone Number: 07/18/2020, 2:18 PM  Clinical Narrative: RNCM reached out to patient to discuss current treatment plan and plans for discharge. Patient reports that he thinks he is doing well enough that he may be able to go home with is brother. He reports that he believes the cancer center will be able to help bring him back and forth to appointments. He says that originally they had talked about 10 radiation treatments but now they are talking about 5. He reports that he thought he had an appointment this morning but that didn't happen and now he is unsure of plan.       Expected Discharge Plan: [place] Barriers to Discharge: Continued Medical Work up  Expected Discharge Plan and Services Expected Discharge Plan: [place] arrangements for the past 2 months: Single Family Home Expected Discharge Date: 07/10/20                                     Social Determinants of Health (SDOH) Interventions    Readmission Risk Interventions No flowsheet data found.

## 2020-07-18 NOTE — Plan of Care (Signed)
  Problem: Activity: Goal: Risk for activity intolerance will decrease 07/18/2020 0318 by Guinevere Ferrari, RN Outcome: Progressing 07/17/2020 1857 by Guinevere Ferrari, RN Outcome: Progressing   Problem: Nutrition: Goal: Adequate nutrition will be maintained 07/18/2020 0318 by Guinevere Ferrari, RN Outcome: Progressing 07/17/2020 1857 by Guinevere Ferrari, RN Outcome: Progressing   Problem: Coping: Goal: Level of anxiety will decrease 07/18/2020 0318 by Guinevere Ferrari, RN Outcome: Progressing 07/17/2020 1857 by Guinevere Ferrari, RN Outcome: Progressing   Problem: Elimination: Goal: Will not experience complications related to bowel motility 07/18/2020 0318 by Guinevere Ferrari, RN Outcome: Progressing 07/17/2020 1857 by Guinevere Ferrari, RN Outcome: Progressing Goal: Will not experience complications related to urinary retention 07/18/2020 0318 by Guinevere Ferrari, RN Outcome: Progressing 07/17/2020 1857 by Guinevere Ferrari, RN Outcome: Progressing   Problem: Safety: Goal: Ability to remain free from injury will improve 07/18/2020 0318 by Guinevere Ferrari, RN Outcome: Progressing 07/17/2020 1857 by Guinevere Ferrari, RN Outcome: Progressing

## 2020-07-18 NOTE — Progress Notes (Signed)
Pt received education on Lovenox; despite education and verbalizing understanding he continues to refuse medication.

## 2020-07-19 ENCOUNTER — Telehealth: Payer: Self-pay | Admitting: Internal Medicine

## 2020-07-19 DIAGNOSIS — C3412 Malignant neoplasm of upper lobe, left bronchus or lung: Secondary | ICD-10-CM | POA: Diagnosis not present

## 2020-07-19 DIAGNOSIS — G893 Neoplasm related pain (acute) (chronic): Secondary | ICD-10-CM | POA: Diagnosis not present

## 2020-07-19 DIAGNOSIS — M25552 Pain in left hip: Secondary | ICD-10-CM | POA: Diagnosis not present

## 2020-07-19 DIAGNOSIS — C3490 Malignant neoplasm of unspecified part of unspecified bronchus or lung: Secondary | ICD-10-CM

## 2020-07-19 DIAGNOSIS — C3492 Malignant neoplasm of unspecified part of left bronchus or lung: Secondary | ICD-10-CM | POA: Diagnosis not present

## 2020-07-19 MED ORDER — OXYCODONE HCL ER 15 MG PO T12A: 15.0000 mg | EXTENDED_RELEASE_TABLET | Freq: Two times a day (BID) | ORAL | 0 refills | Status: DC

## 2020-07-19 MED ORDER — NICOTINE POLACRILEX 4 MG MT LOZG: LOZENGE | OROMUCOSAL | 1 refills | Status: AC

## 2020-07-19 MED ORDER — OMEPRAZOLE 20 MG PO CPDR: 20.0000 mg | DELAYED_RELEASE_CAPSULE | Freq: Every day | ORAL | 0 refills | Status: AC

## 2020-07-19 MED ORDER — ADULT MULTIVITAMIN W/MINERALS CH: 1.0000 | ORAL_TABLET | Freq: Every day | ORAL | Status: AC

## 2020-07-19 MED ORDER — OXYCODONE HCL 10 MG PO TABS: 10.0000 mg | ORAL_TABLET | Freq: Four times a day (QID) | ORAL | 0 refills | Status: DC | PRN

## 2020-07-19 MED ORDER — ENSURE ENLIVE PO LIQD: 237.0000 mL | Freq: Three times a day (TID) | ORAL | 0 refills | Status: AC

## 2020-07-19 NOTE — Progress Notes (Signed)
Kevin Baker   DOB:[birth year]   ZL#:[id number]    Subjective: Patient states his pain is better controlled.  He is eager to go home.  Is waiting for the ride this evening.  He is going home to live with his brother.  Plan to start radiation next week  Objective:  Vitals:   07/19/20 1158 07/19/20 1549  BP: 107/68 112/75  Pulse: 80 85  Resp: 17 16  Temp: 97.9 F (36.6 C) 98 F (36.7 C)  SpO2: 100% 99%     Intake/Output Summary (Last 24 hours) at 07/19/2020 2344 Last data filed at 07/19/2020 1411 Gross per 24 hour  Intake 120 ml  Output 650 ml  Net -530 ml    Physical Exam Constitutional:      Comments: Frail-appearing Caucasian male patient.  Sitting in the edge of the bed  HENT:     Head: Normocephalic and atraumatic.     Mouth/Throat:     Mouth: Oropharynx is clear and moist.     Pharynx: No oropharyngeal exudate.  Eyes:     Pupils: Pupils are equal, round, and reactive to light.  Cardiovascular:     Rate and Rhythm: Normal rate and regular rhythm.  Pulmonary:     Effort: No respiratory distress.     Breath sounds: No wheezing.     Comments: Decreased air entry bilaterally.  No wheeze or crackles Abdominal:     General: Bowel sounds are normal. There is no distension.     Palpations: Abdomen is soft. There is no mass.     Tenderness: There is no abdominal tenderness. There is no guarding or rebound.  Musculoskeletal:        General: No tenderness or edema. Normal range of motion.     Cervical back: Normal range of motion and neck supple.  Skin:    General: Skin is warm.  Neurological:     Mental Status: He is alert and oriented to person, place, and time.  Psychiatric:        Mood and Affect: Affect normal.      Labs:  Lab Results  Component Value Date   WBC 8.4 07/16/2020   HGB 9.6 (L) 07/16/2020   HCT 28.9 (L) 07/16/2020   MCV 100.7 (H) 07/16/2020   PLT 497 (H) 07/16/2020   NEUTROABS 6.0 07/16/2020    Lab Results  Component Value Date   NA 134 (L)  07/16/2020   K 4.1 07/16/2020   CL 95 (L) 07/16/2020   CO2 31 07/16/2020    Studies:  No results found.  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  Awaiting NGS to plan systemic therapy.  Patient will need outpatient MRI brain.  #Pain control-adequately controlled on steroids/narcotics.  Planned radiation outpatient.   #We will plan follow-up in the cancer center next week.  Discussed with the patient in agreement.  Cammie Sickle, MD 07/19/2020

## 2020-07-19 NOTE — TOC Progression Note (Signed)
Transition of Care [facility]) - Progression Note    Patient Details  Name: Kevin Baker MRN: [id number] Date of Birth: 1955/06/23  Transition of Care [facility]) CM/SW [place], RN Phone Number: 07/19/2020, 10:31 AM  Clinical Narrative:   Met with patient in room and confirmed address. Patient is agreeable to home health and does not have preference as to agency. RNCM reached out to [place] with [facility] to see if she can accept the referral.     Expected Discharge Plan: [place] Barriers to Discharge: Continued Medical Work up  Expected Discharge Plan and Services Expected Discharge Plan: [place] arrangements for the past 2 months: Single Family Home Expected Discharge Date: 07/10/20                                     Social Determinants of Health (SDOH) Interventions    Readmission Risk Interventions No flowsheet data found.

## 2020-07-19 NOTE — Telephone Encounter (Signed)
Kevin Baker/Kevin Baker hospital follow-up lung cancer.  Schedule MD appointment next week-coordinate with radiation appointment.  Also make appointment with [facility].  GB

## 2020-07-19 NOTE — Progress Notes (Signed)
Discharge Note: Reviewed discharge instructions with pt. Pt verbalized understanding.  IV cath intact upon removal. Pt discharged with personal belongings. Obtained vitals. Staff wheeled pt out. Pt transported to home via private vehicle.

## 2020-07-19 NOTE — Discharge Summary (Signed)
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  --will start XRT 3/7. Per TOC van transport has been arranged. --ambulatory referral to palliative care  Pain secondary to cancer --stable on oxycontin + oxycodone for breathrough --database reviewed  --brother will manage pain medication for patient. Brother experienced w/ this as his wife just died of metastatic cancer  Small 5 x 16 mm enhancing lesion in the L2 posterosuperior vertebral body is indeterminate for small bone Metastasis versus Schmorl's node. --Follow-up PET-CT might best characterize further. Failing MAC, short interval 2-3 month follow-up MRI would be most valuable.  Degenerative grade 2 spondylolisthesis at L4-L5 with advanced disc and posterior element degeneration --can follow-up with neurosurgery Dr. Cari Caraway as an outpatient to consider intervention if patient's health improves with treatment.    Contact information for follow-up providers    Cammie Sickle, MD Follow up.   Specialties: Internal Medicine, Oncology Why: office will contact tyou Contact information: [place] [address] [phone]        Noreene Filbert, MD Follow up.   Specialty: Radiation Oncology Why: Office will contact you Contact information: [place] [address] [phone]            Contact information for after-discharge care    [facility] .   Service: Skilled Nursing Contact information: [place] [phone]                   Discharge Diagnoses: Principal diagnosis is #1 Principal Problem:   Primary cancer of left upper lobe of lung ([place]) Active Problems:   Acute on chronic pain of left hip   Normocytic anemia   Palliative care encounter   Protein-calorie malnutrition, severe   Cancer associated pain   Metastatic lung cancer (metastasis from lung to other site) [facility])   Discharge Condition: improved Disposition: home w/ HH RN, PT, OT DME hospital bed  Diet recommendation:  Diet Orders (From admission, onward)    Start     Ordered   07/19/20 0000  Diet general        07/19/20 1501   07/12/20 0917  Diet regular Room service appropriate? Yes; Fluid consistency: Thin  Diet effective now       Question Answer Comment  Room service appropriate? Yes   Fluid consistency: Thin      07/12/20 0916           Filed Weights   07/09/20 1433 07/10/20 1535  Weight: 68 kg 58.9 kg    HPI/Hospital Course:   65 year old man presented with left hip pain and difficulty ambulating.  Patient was seen by orthopedics who recommended against any operative intervention for left trochanteric lesion.  Seen by neurosurgery for abnormalities in the lumbar spine with recommendations for outpatient follow-up with you response to treatment.  Seen by pulmonology, underwent bronchoscopy.  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  Seen by oncology and radiation oncology.  Plan is for radiation  to begin 3/7. SNF recommended but pt refused and decided to go home with brother who accepted him and will manage meds. Home w/ HH.  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  Seen by orthopedics, no operative fixation recommended. Seen by neurosurgery, not a good candidate for elective back surgery secondary to metastatic lung cancer. --can follow-up with neurosurgery Dr. Cari Caraway as an outpatient to consider intervention if patient's health improves with treatment. --ambulatory referral to palliative care  Pain secondary to cancer --stable on oxycontin + oxycodone for breathrough --database reviewed  --brother will manage pain medication for patient. Brother experienced w/ this as his wife just died of metastatic cancer  Alcohol use disorder, in early remission --No signs or symptoms of withdrawal.  Normocytic anemia --Probably anemia of chronic disease, Hgb stable.  Smoker --Recommend cessation  Severe malnutrition --Dietitian consult appreciated  Aortic atherosclerosis  Emphysema   Small 5 x 16 mm enhancing lesion in the L2 posterosuperior vertebral body is indeterminate for small bone Metastasis versus Schmorl's node. Follow-up PET-CT might best characterize further. Failing MAC, short interval 2-3 month follow-up MRI would be most valuable.  Degenerative grade 2 spondylolisthesis at L4-L5 with advanced disc and posterior element degeneration   Consults:   Oncology   Orthopedics   PMT  Pulmonology   Radiation oncology   Neurosurgery   Procedures:   Fiberoptic bronchoscopy  .   Today's assessment: S: CC: left leg pain  Feeling better, moving better, walking better Determined to go home with brother  O: Vitals:  Vitals:   07/19/20 1158 07/19/20 1549  BP: 107/68 112/75  Pulse: 80 85  Resp: 17 16  Temp: 97.9 F (36.6 C) 98 F (36.7 C)  SpO2: 100% 99%    Constitutional:  . Appears calm and comfortable ENMT:  . grossly normal hearing  Respiratory:  . CTA bilaterally, no w/r/r.  . Respiratory effort normal. Cardiovascular:  . RRR, no m/r/g . No LE extremity edema    Musculoskeletal:  . Stands without assistance, improved movement left foot Psychiatric:  . Mental status o Mood, affect appropriate    Discharge Instructions  Discharge Instructions    Amb Referral to Palliative Care   Complete by: As directed    Ambulatory referral to Oncology   Complete by: As directed    Ambulatory referral to Radiation Oncology   Complete by: As directed    Diet general   Complete by: As directed    Discharge instructions   Complete by: As directed    Call your physician or seek immediate medical attention for increased pain, weakness, difficulty breathing or worsening of condition.   Increase activity slowly   Complete by: As directed      Allergies as of 07/19/2020      Reactions   Codeine Nausea And Vomiting   Sulfamethoxazole-trimethoprim Nausea And Vomiting   Lactose Intolerance (gi) Diarrhea      Medication List    STOP taking these medications   naltrexone 50 MG tablet Commonly known as: DEPADE     TAKE these medications   acetaminophen 325 MG tablet Commonly known as: TYLENOL Take 500 mg by mouth every 6 (six) hours as needed.   baclofen 10 MG tablet Commonly known as: LIORESAL Take 10 mg by mouth 3 (three) times daily  as needed for muscle spasms.   buPROPion 150 MG 12 hr tablet Commonly known as: WELLBUTRIN SR Take 150 mg by mouth daily.   Cholecalciferol 50 MCG (2000 UT) Tabs Take 2,000 Units by mouth daily.   diclofenac Sodium 1 % Gel Commonly known as: VOLTAREN Apply 2-4 g topically 4 (four) times daily as needed.   feeding supplement Liqd Take 237 mLs by mouth 3 (three) times daily between meals.   folic acid 1 MG tablet Commonly known as: FOLVITE Take 1 mg by mouth daily.   gabapentin 300 MG capsule Commonly known as: NEURONTIN Take 300 mg by mouth 3 (three) times daily.   hydrOXYzine 25 MG capsule Commonly known as: VISTARIL Take 25 mg by mouth 4 (four) times daily as needed for anxiety.   lidocaine 5  % Commonly known as: LIDODERM Place 1 patch onto the skin See admin instructions. Apply 1 patch to the skin once a day to painful areas for 12 hours, then remove.   multivitamin with minerals Tabs tablet Take 1 tablet by mouth daily. Start taking on: July 20, 2020   naloxone 4 MG/0.1ML Liqd nasal spray kit Commonly known as: NARCAN SPRAY 1 SPRAY INTO ONE NOSTRIL AS DIRECTED FOR OPIOID OVERDOSE (TURN PERSON ON SIDE AFTER DOSE. IF NO RESPONSE IN 2-3 MINUTES OR PERSON RESPONDS BUT RELAPSES, REPEAT USING A NEW SPRAY DEVICE AND SPRAY INTO THE OTHER NOSTRIL. CALL 911 AFTER USE.) * EMERGENCY USE ONLY *   nicotine polacrilex 4 MG lozenge Commonly known as: COMMIT DISSOLVE 1 LOZENGE MOUTH EVERY HOUR AS NEEDED TO HELP STOP SMOKING ** CALL [facility] [phone] FOR INFORMATION ABOUT SUPPORT IN QUITTING.  - MAX 24 PIECES IN 24 HOURS TO HELP STOP SMOKING ** CALL [facility] [phone] FOR INFORMATION ABOUT SUPPORT IN QUITTING.  - MAX 24 PIECES IN 24 HOURS   omeprazole 20 MG capsule Commonly known as: PRILOSEC Take 1 capsule (20 mg total) by mouth daily.   oxyCODONE 15 mg 12 hr tablet Commonly known as: OXYCONTIN Take 1 tablet (15 mg total) by mouth every 12 (twelve) hours.   Oxycodone HCl 10 MG Tabs Take 1 tablet (10 mg total) by mouth every 6 (six) hours as needed for moderate pain or severe pain.   traZODone 50 MG tablet Commonly known as: DESYREL Take 50 mg by mouth at bedtime as needed.            Durable Medical Equipment  (From admission, onward)         Start     Ordered   07/19/20 1317  For home use only DME Walker rolling  Once       Question Answer Comment  Walker: With 5 Inch Wheels   Patient needs a walker to treat with the following condition Metastatic lung cancer (metastasis from lung to other site) [facility])      07/19/20 1316   07/18/20 1735  For home use only DME Hospital bed  Once       Question Answer Comment  Length of Need Lifetime   Bed type  Semi-electric      07/18/20 1734         Allergies  Allergen Reactions  . Codeine Nausea And Vomiting  . Sulfamethoxazole-Trimethoprim Nausea And Vomiting  . Lactose Intolerance (Gi) Diarrhea    The results of significant diagnostics from this hospitalization (including imaging, microbiology, ancillary and laboratory) are listed below for reference.    Significant Diagnostic Studies: MR Lumbar Spine W  Wo Contrast  Result Date: 07/11/2020 CLINICAL DATA:  65 year old male with low back and left lower extremity pain progressed x3 months. Spiculated left lower lobe lung mass in December. EXAM: MRI LUMBAR SPINE WITHOUT AND WITH CONTRAST TECHNIQUE: Multiplanar and multiecho pulse sequences of the lumbar spine were obtained without and with intravenous contrast. CONTRAST:  69m GADAVIST GADOBUTROL 1 MMOL/ML IV SOLN COMPARISON:  CT Chest, Abdomen, and Pelvis 05/14/2020. FINDINGS: Segmentation:  Normal on the December CT. Alignment: Grade 2 spondylolisthesis of L4 on L5, and grade 1 anterolisthesis of L5 on S1 are stable since December. No associated pars fractures. Underlying mild dextroconvex lumbar scoliosis. Vertebrae: Abnormal decreased T1 signal, STIR hyperintensity and patchy enhancement in the left sacral ala partially visible on series 7, image 11. Elsewhere the visible sacrum and left SI joint appear intact. However, left SI joint ankylosis was demonstrated on the prior CT. Subtle semicircular area of increased STIR signal in the left posterolateral L2 vertebral body (series 7, image 9) demonstrates mild enhancement, and a subtle sclerotic lesion was present here in December. However, axial MRI appearance today raises the possibility of degenerative Schmorl's node (series 8, image 15). The lesion is 16 mm diameter by 8 mm thick. Otherwise normal marrow signal in the visible lower thoracic and lumbar spine. Mild chronic T11 superior endplate compression is stable since December. Conus medullaris  and cauda equina: Conus extends to the T12-L1 level. No lower spinal cord or conus signal abnormality. No abnormal intradural enhancement. No dural thickening. Cauda equina nerve roots appear normal. Paraspinal and other soft tissues: Partially visible distended urinary bladder. Trace free fluid in the pelvis. Otherwise negative visible abdominal viscera. Negative lumbar paraspinal soft tissues. Disc levels: Capacious lower thoracic and lumbar spinal canal above L4. L4-L5: Grade 2 spondylolisthesis with advanced disc and posterior element degeneration. Severe facet hypertrophy greater on the right with degenerative facet joint fluid. Moderate spinal stenosis and bilateral lateral recess stenosis. Moderate to severe bilateral L4 foraminal stenosis. L5-S1: Mild anterolisthesis with disc and posterior element degeneration but no spinal stenosis. There is moderate right L5 foraminal stenosis. IMPRESSION: 1. Partially visible abnormal signal in the left sacral ala with patchy enhancement. This does not seem mass-like, and more resembles a sacral insufficiency fracture than metastatic disease (but see also #2). 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 2. Small 5 x 16 mm enhancing lesion in the L2 posterosuperior vertebral body is indeterminate for small bone Metastasis versus Schmorl's node. Follow-up PET-CT might best characterize further. Failing MAC, short interval 2-3 month follow-up MRI would be most valuable. 3. No other metastatic disease identified in the visible lower thoracic or lumbar spine. 4. Degenerative grade 2 spondylolisthesis at L4-L5 with advanced disc and posterior element degeneration, moderate spinal, lateral recess, and moderate to severe foraminal stenosis. Grade 1 spondylolisthesis at L5-S1 with moderate right foraminal stenosis. Electronically Signed   By: HGenevie AnnM.D.   On: 07/11/2020 08:10   CT Hip Left Wo Contrast  Result Date: 07/10/2020 CLINICAL DATA:  Left leg and arm  weakness with pain since March 2021, negative radiographs EXAM: CT OF THE LEFT HIP WITHOUT CONTRAST TECHNIQUE: Multidetector CT imaging of the left hip was performed according to the standard protocol. Multiplanar CT image reconstructions were also generated. COMPARISON:  Radiograph 07/09/2020, CT 05/14/2020 FINDINGS: Bones/Joint/Cartilage The osseous structures appear diffusely demineralized which may limit detection of small or nondisplaced fractures. Ovoid lucent lesion centered within the lesser trochanter measuring approximately 2.6 x 2.3 x 3.1 cm in  size. No clear associated pathologic fracture is evident at this time. No other suspicious lytic or blastic lesions within the included margins of imaging. No other acute bony abnormality. Specifically, no fracture, subluxation, or dislocation of the left hip or included bony pelvis. More remote remote posttraumatic changes of the left pubic root, superior and inferior rami, unchanged from comparison CT. Ankylosis left SI joint and likely prior traumatic deformity of the left ilium. Degenerative change at the pubic symphysis and left hip is similar to comparison. No sizable hip effusion. Ligaments Suboptimally assessed by CT. Muscles and Tendons No acute musculotendinous abnormality is seen. Soft tissues Atherosclerotic calcifications in the iliac arteries and proximal common and superficial femoral arteries. Circumferential thickening of the urinary bladder though possibly related to underdistention. Fluid-filled appearance of the colon, correlate for rapid transit state. IMPRESSION: 1. Ovoid lucent lesion centered within the left lesser trochanter measuring approximately 2.6 x 2.3 x 3.1 cm in size. No clear associated pathologic fracture is evident at this time. Findings concerning for metastatic lesion given known left lung lesion. 2. Remote posttraumatic changes of the left pubic root, superior and inferior rami, and left ilium unchanged from comparison CT. 3.  Ankylosis left SI joint. 4. Circumferential thickening of the urinary bladder though possibly related to underdistention. Correlate with urinalysis to exclude cystitis. 5. Fluid-filled appearance of the colon, could reflect diarrheal illness/rapid transit state. 6. Atherosclerosis These results were called by telephone at the time of interpretation on 07/10/2020 at 12:23 am to provider Dr. Tamala Julian, who verbally acknowledged these results. Electronically Signed   By: Lovena Le M.D.   On: 07/10/2020 00:23   DG C-Arm 1-60 Min-No Report  Result Date: 07/12/2020 Fluoroscopy was utilized by the requesting physician.  No radiographic interpretation.   DG Hip Unilat W or Wo Pelvis 2-3 Views Left  Result Date: 07/09/2020 CLINICAL DATA:  Acute on chronic hip pain EXAM: DG HIP (WITH OR WITHOUT PELVIS) 2-3V LEFT COMPARISON:  05/13/2020 FINDINGS: Chronic deformity of left iliac bone and pubic rami. No acute displaced fracture or malalignment is seen. There are mild degenerative changes of both hips. Vascular calcifications. IMPRESSION: No acute osseous abnormality. Chronic deformity of left iliac bone and pubic rami. Electronically Signed   By: Donavan Foil M.D.   On: 07/09/2020 21:25   CT Super D Chest W Contrast  Result Date: 07/11/2020 CLINICAL DATA:  Left lower lobe mass, EBUS planning EXAM: CT CHEST WITH CONTRAST TECHNIQUE: Multidetector CT imaging of the chest was performed using thin slice collimation for electromagnetic bronchoscopy planning purposes, with intravenous contrast. CONTRAST:  51m OMNIPAQUE IOHEXOL 300 MG/ML  SOLN COMPARISON:  CT chest, 05/14/2020 FINDINGS: Cardiovascular: Aortic atherosclerosis. Normal heart size. Three-vessel coronary artery calcifications and/or stents. No pericardial effusion. Mediastinum/Nodes: No enlarged mediastinal, hilar, or axillary lymph nodes. Thyroid gland, trachea, and esophagus demonstrate no significant findings. Lungs/Pleura: Slight interval enlargement of a  large mass of the superior segment left lower lobe, measuring 4.9 x 4.4 cm, previously 4.6 x 3.8 cm when measured similarly (series 3, image 40). There is postobstructive consolidation and nodularity of the dependent left lower lobe (series 3, image 51). Significant interval enlargement of a spiculated appearing nodule of the anterior left upper lobe measuring 1.4 x 1.2 cm, previously 1.1 x 1.0 cm when measured similarly (series 3, image 37). Unchanged small nodule of the superior segment right lower lobe measuring 4 mm (series 3, image 27). Slight interval enlargement of a nodule of the peripheral right upper lobe measuring 3 mm,  previously no greater than 1-2 mm (series 3, image 35). Minimal centrilobular emphysema. No pleural effusion or pneumothorax. Upper Abdomen: No acute abnormality. Musculoskeletal: No chest wall mass or suspicious bone lesions identified. IMPRESSION: 1. Slight interval enlargement of a large mass of the superior segment left lower lobe, measuring 4.9 x 4.4 cm, previously 4.6 x 3.8 cm when measured similarly. There is postobstructive consolidation and nodularity of the dependent left lower lobe. Findings are consistent with primary lung malignancy. 2. Significant interval enlargement of a spiculated appearing nodule of the anterior left upper lobe measuring 1.4 x 1.2 cm, previously 1.1 x 1.0 cm when measured similarly. This is concerning for a synchronous primary versus metastasis. 3. Slight interval enlargement of a nodule of the peripheral right upper lobe measuring 3 mm, previously no greater than 1-2 mm. This is again concerning for synchronous primary or metastasis. 4. Unchanged small nodule of the superior segment right lower lobe measuring 4 mm, nonspecific. 5. No mediastinal lymphadenopathy. 6. Minimal centrilobular emphysema. 7. Coronary artery disease. Aortic Atherosclerosis (ICD10-I70.0) and Emphysema (ICD10-J43.9). Electronically Signed   By: Eddie Candle M.D.   On: 07/11/2020  14:10    Microbiology: Recent Results (from the past 240 hour(s))  Resp Panel by RT-PCR (Flu A&B, Covid) Nasopharyngeal Swab     Status: None   Collection Time: 07/09/20 11:13 PM   Specimen: Nasopharyngeal Swab; Nasopharyngeal(NP) swabs in vial transport medium  Result Value Ref Range Status   SARS Coronavirus 2 by RT PCR NEGATIVE NEGATIVE Final    Comment: (NOTE) SARS-CoV-2 target nucleic acids are NOT DETECTED.  The SARS-CoV-2 RNA is generally detectable in upper respiratory specimens during the acute phase of infection. The lowest concentration of SARS-CoV-2 viral copies this assay can detect is 138 copies/mL. A negative result does not preclude SARS-Cov-2 infection and should not be used as the sole basis for treatment or other patient management decisions. A negative result may occur with  improper specimen collection/handling, submission of specimen other than nasopharyngeal swab, presence of viral mutation(s) within the areas targeted by this assay, and inadequate number of viral copies(<138 copies/mL). A negative result must be combined with clinical observations, patient history, and epidemiological information. The expected result is Negative.  Fact Sheet for Patients:  EntrepreneurPulse.com.au  Fact Sheet for Healthcare Providers:  IncredibleEmployment.be  This test is no t yet approved or cleared by the [place] FDA and  has been authorized for detection and/or diagnosis of SARS-CoV-2 by FDA under an Emergency Use Authorization (EUA). This EUA will remain  in effect (meaning this test can be used) for the duration of the COVID-19 declaration under Section 564(b)(1) of the Act, 21 U.S.C.section 360bbb-3(b)(1), unless the authorization is terminated  or revoked sooner.       Influenza A by PCR NEGATIVE NEGATIVE Final   Influenza B by PCR NEGATIVE NEGATIVE Final    Comment: (NOTE) The Xpert Xpress SARS-CoV-2/FLU/RSV plus  assay is intended as an aid in the diagnosis of influenza from Nasopharyngeal swab specimens and should not be used as a sole basis for treatment. Nasal washings and aspirates are unacceptable for Xpert Xpress SARS-CoV-2/FLU/RSV testing.  Fact Sheet for Patients: EntrepreneurPulse.com.au  Fact Sheet for Healthcare Providers: IncredibleEmployment.be  This test is not yet approved or cleared by the [place] FDA and has been authorized for detection and/or diagnosis of SARS-CoV-2 by FDA under an Emergency Use Authorization (EUA). This EUA will remain in effect (meaning this test can be used) for the duration of the COVID-19  declaration under Section 564(b)(1) of the Act, 21 U.S.C. section 360bbb-3(b)(1), unless the authorization is terminated or revoked.  Performed at [facility], [address] [place], [place] [address]      Labs: Basic Metabolic Panel: Recent Labs  Lab 07/14/20 0743 07/16/20 0822  NA 133* 134*  K 3.9 4.1  CL 94* 95*  CO2 31 31  GLUCOSE 105* 111*  BUN 12 15  CREATININE 0.53* 0.50*  CALCIUM 8.8* 8.7*   CBC: Recent Labs  Lab 07/14/20 0743 07/16/20 0822  WBC 9.0 8.4  NEUTROABS 6.5 6.0  HGB 10.7* 9.6*  HCT 30.6* 28.9*  MCV 97.8 100.7*  PLT 506* 497*    Recent Labs    05/13/20 1549  BNP 120.2*     Principal Problem:   Primary cancer of left upper lobe of lung ([facility]) Active Problems:   Acute on chronic pain of left hip   Normocytic anemia   Palliative care encounter   Protein-calorie malnutrition, severe   Cancer associated pain   Metastatic lung cancer (metastasis from lung to other site) [facility])   Time coordinating discharge: 35 minutes  Signed:  Murray Hodgkins, MD  [facility]  07/19/2020, 4:34 PM

## 2020-07-19 NOTE — Progress Notes (Addendum)
Physical Therapy Treatment Patient Details Name: Kevin Baker MRN: [id number] DOB: [birth year] Today's Date: 07/19/2020    History of Present Illness 65 y.o. 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    PT Comments    Pt reports he plans to d/c to his brother's house which has ~5 steps to enter with B rails. With max education/encouragement pt agreeable to attempting stair negotiation but meeting held in gym & pt then declines practicing stair negotiation in stair well. PT attempts to educate pt on compensatory pattern but pt not very receptive of education. Pt ambulates with impaired gait pattern & unsafe manner as he will continue walking with only 1UE on RW while assisting LLE with advancing with RUE. Pt also endorses lightheadedness towards end of gait that lessens but does not go away with lying down - vitals checked, nurse made aware. Continue to recommend STR to maximize safety with gait, focus on LLE strengthening, and for stair negotiation. Anticipate if pt d/c's to brother's house he will be unsafe with stair negotiation which increases his fall risk.   Addendum: Pt may benefit from orthotics consult for L AFO to increase safety with mobility if it is anticipated pt will continue to have ongoing LLE weakness.    Follow Up Recommendations  SNF;Supervision/Assistance - 24 hour     Equipment Recommendations  Rolling walker with 5" wheels    Recommendations for Other Services       Precautions / Restrictions Restrictions Weight Bearing Restrictions: No    Mobility  Bed Mobility Overal bed mobility: Needs Assistance Bed Mobility: Supine to  Sit;Sit to Supine     Supine to sit: Supervision;HOB elevated Sit to supine: Modified independent (Device/Increase time);HOB elevated   General bed mobility comments: Uses UE to assist to LLE to EOB    Transfers Overall transfer level: Needs assistance Equipment used: Rolling walker (2 wheeled) Transfers: Sit to/from Stand Sit to Stand: Supervision            Ambulation/Gait Ambulation/Gait assistance: Min guard;Supervision Gait Distance (Feet): 170 Feet Assistive device: Rolling walker (2 wheeled) Gait Pattern/deviations: Decreased step length - right;Decreased step length - left;Decreased stance time - left;Decreased stride length;Decreased dorsiflexion - left;Decreased weight shift to left Gait velocity: decreased   General Gait Details: Intermittently throughout gait pt will physically assist LLE to advance with RUE & maintain LUE on RW while continuing to move. Not open to education re: this being unsafe. Pt with decreased LLE foot clearance.   Stairs Stairs: Yes       General stair comments: Max encouragement to attempt stair negotiation but upon arrival to gym meeting was being held. Encouraged pt to practice stair negotiation in stair well but pt declined. PT educated pt on compensatory pattern for stair negotiation but pt states "I've got to do it my own way" and "even if I have to pull myself up with my arms"   Wheelchair Mobility    Modified Rankin (Stroke Patients Only)       Balance Overall balance assessment: Needs assistance Sitting-balance support: Bilateral upper extremity supported Sitting balance-Leahy Scale: Good       Standing balance-Leahy Scale: Fair Standing balance comment: reliant on walker able  to WB through L LE               High Level Balance Comments: Pt reports having at least 15 falls in the previous 2 months            Cognition Arousal/Alertness: Awake/alert Behavior During Therapy:  (tangential conversation, not  very open to education) Overall Cognitive Status: Within Functional Limits for tasks assessed                                 General Comments: tangential conversation, difficult to redirect      Exercises      General Comments General comments (skin integrity, edema, etc.): Pt endorses lightheadedness towards end of gait, once back in bed BP = 107/68 mmHg - nurse made aware with suggestion of taking orthostatic vital signs; pt c/o ongoing feeling of lightheadedness even upon return supine. SpO2 >90% throughout session on room air but pt notes SOB but "they gave me breathing exercises"      Pertinent Vitals/Pain Pain Assessment: Faces Faces Pain Scale: Hurts a little bit Pain Location: L LE Pain Descriptors / Indicators:  (does not describe) Pain Intervention(s): Monitored during session;Limited activity within patient's tolerance    Home Living                      Prior Function            PT Goals (current goals can now be found in the care plan section) Acute Rehab PT Goals Patient Stated Goal: Get back to walking PT Goal Formulation: With patient Time For Goal Achievement: 07/24/20 Potential to Achieve Goals: Fair Progress towards PT goals: Progressing toward goals    Frequency    Min 2X/week      PT Plan Current plan remains appropriate    Co-evaluation              AM-PAC PT "6 Clicks" Mobility   Outcome Measure  Help needed turning from your back to your side while in a flat bed without using bedrails?: None Help needed moving from lying on your back to sitting on the side of a flat bed without using bedrails?: None Help needed moving to and from a bed to a chair (including a wheelchair)?: A Little Help needed standing up from a chair using your arms (e.g., wheelchair or bedside chair)?: A Little Help needed to walk in hospital room?: A Little Help needed climbing 3-5 steps with a railing? : A Lot 6 Click Score: 19    End  of Session Equipment Utilized During Treatment: Gait belt Activity Tolerance: Patient limited by pain Patient left: in bed;with bed alarm set;with call bell/phone within reach Nurse Communication:  (BP & c/o lightheadedness) PT Visit Diagnosis: Muscle weakness (generalized) (M62.81);Difficulty in walking, not elsewhere classified (R26.2);Pain Pain - Right/Left: Left Pain - part of body: Hip     Time: 0814-4818 PT Time Calculation (min) (ACUTE ONLY): 25 min  Charges:  $Therapeutic Activity: 23-37 mins                     Lavone Nian, PT, DPT 07/19/20, 12:46 PM    Waunita Schooner 07/19/2020, 12:41 PM

## 2020-07-19 NOTE — Progress Notes (Incomplete)
Discharge Note: Reviewed discharge instructions with pt. Pt verbalized understanding.  Pt discharged with personal belongings. IV cath intact upon removal. Obtained vitals. Staff wheeled pt out. Pt transported to home via private vehicle.

## 2020-07-19 NOTE — Plan of Care (Signed)
  Problem: Education: Goal: Knowledge of General Education information will improve Description: Including pain rating scale, medication(s)/side effects and non-pharmacologic comfort measures Outcome: Adequate for Discharge   Problem: Health Behavior/Discharge Planning: Goal: Ability to manage health-related needs will improve Outcome: Adequate for Discharge   Problem: Clinical Measurements: Goal: Ability to maintain clinical measurements within normal limits will improve Outcome: Adequate for Discharge Goal: Will remain free from infection Outcome: Adequate for Discharge Goal: Diagnostic test results will improve Outcome: Adequate for Discharge Goal: Respiratory complications will improve Outcome: Adequate for Discharge Goal: Cardiovascular complication will be avoided Outcome: Adequate for Discharge   Problem: Activity: Goal: Risk for activity intolerance will decrease Outcome: Adequate for Discharge   Problem: Nutrition: Goal: Adequate nutrition will be maintained Outcome: Adequate for Discharge   Problem: Coping: Goal: Level of anxiety will decrease Outcome: Adequate for Discharge   Problem: Elimination: Goal: Will not experience complications related to bowel motility Outcome: Adequate for Discharge Goal: Will not experience complications related to urinary retention Outcome: Adequate for Discharge   Problem: Pain Managment: Goal: General experience of comfort will improve Outcome: Adequate for Discharge   Problem: Safety: Goal: Ability to remain free from injury will improve Outcome: Adequate for Discharge   Problem: Skin Integrity: Goal: Risk for impaired skin integrity will decrease Outcome: Adequate for Discharge   Problem: Acute Rehab PT Goals(only PT should resolve) Goal: Pt Will Go Supine/Side To Sit Outcome: Adequate for Discharge Goal: Pt Will Go Sit To Supine/Side Outcome: Adequate for Discharge Goal: Pt Will Transfer Bed To Chair/Chair To  Bed Outcome: Adequate for Discharge Goal: Pt Will Ambulate Outcome: Adequate for Discharge   Problem: Acute Rehab OT Goals (only OT should resolve) Goal: Pt. Will Transfer To Toilet Outcome: Adequate for Discharge Goal: Pt. Will Perform Toileting-Clothing Manipulation Outcome: Adequate for Discharge Goal: OT Additional ADL Goal #1 Outcome: Adequate for Discharge   Problem: Malnutrition  (NI-5.2) Goal: Food and/or nutrient delivery Description: Individualized approach for food/nutrient provision. Outcome: Adequate for Discharge

## 2020-07-22 ENCOUNTER — Ambulatory Visit: Payer: Medicare HMO

## 2020-07-22 ENCOUNTER — Other Ambulatory Visit: Payer: Self-pay

## 2020-07-22 ENCOUNTER — Inpatient Hospital Stay: Payer: Medicare HMO | Attending: Internal Medicine

## 2020-07-22 ENCOUNTER — Other Ambulatory Visit: Payer: Self-pay | Admitting: *Deleted

## 2020-07-22 ENCOUNTER — Ambulatory Visit
Admission: RE | Admit: 2020-07-22 | Discharge: 2020-07-22 | Disposition: A | Discharge status: Home / Self Care | Payer: Medicare HMO | Source: Ambulatory Visit | Attending: Radiation Oncology | Admitting: Radiation Oncology

## 2020-07-22 DIAGNOSIS — Z923 Personal history of irradiation: Secondary | ICD-10-CM | POA: Insufficient documentation

## 2020-07-22 DIAGNOSIS — M549 Dorsalgia, unspecified: Secondary | ICD-10-CM | POA: Insufficient documentation

## 2020-07-22 DIAGNOSIS — K4090 Unilateral inguinal hernia, without obstruction or gangrene, not specified as recurrent: Secondary | ICD-10-CM | POA: Diagnosis not present

## 2020-07-22 DIAGNOSIS — Z79899 Other long term (current) drug therapy: Secondary | ICD-10-CM | POA: Insufficient documentation

## 2020-07-22 DIAGNOSIS — M4317 Spondylolisthesis, lumbosacral region: Secondary | ICD-10-CM | POA: Diagnosis not present

## 2020-07-22 DIAGNOSIS — I2510 Atherosclerotic heart disease of native coronary artery without angina pectoris: Secondary | ICD-10-CM | POA: Diagnosis not present

## 2020-07-22 DIAGNOSIS — C7951 Secondary malignant neoplasm of bone: Secondary | ICD-10-CM | POA: Insufficient documentation

## 2020-07-22 DIAGNOSIS — C3412 Malignant neoplasm of upper lobe, left bronchus or lung: Secondary | ICD-10-CM | POA: Diagnosis not present

## 2020-07-22 DIAGNOSIS — M4316 Spondylolisthesis, lumbar region: Secondary | ICD-10-CM | POA: Insufficient documentation

## 2020-07-22 DIAGNOSIS — I700 Atherosclerosis of aorta: Secondary | ICD-10-CM | POA: Diagnosis not present

## 2020-07-22 DIAGNOSIS — R0602 Shortness of breath: Secondary | ICD-10-CM | POA: Insufficient documentation

## 2020-07-22 DIAGNOSIS — Z885 Allergy status to narcotic agent status: Secondary | ICD-10-CM | POA: Insufficient documentation

## 2020-07-22 DIAGNOSIS — J432 Centrilobular emphysema: Secondary | ICD-10-CM | POA: Diagnosis not present

## 2020-07-22 DIAGNOSIS — F1020 Alcohol dependence, uncomplicated: Secondary | ICD-10-CM | POA: Insufficient documentation

## 2020-07-22 DIAGNOSIS — Z881 Allergy status to other antibiotic agents status: Secondary | ICD-10-CM | POA: Insufficient documentation

## 2020-07-22 DIAGNOSIS — M47819 Spondylosis without myelopathy or radiculopathy, site unspecified: Secondary | ICD-10-CM | POA: Diagnosis not present

## 2020-07-22 DIAGNOSIS — M25512 Pain in left shoulder: Secondary | ICD-10-CM | POA: Diagnosis not present

## 2020-07-22 DIAGNOSIS — Z7952 Long term (current) use of systemic steroids: Secondary | ICD-10-CM | POA: Insufficient documentation

## 2020-07-22 DIAGNOSIS — R531 Weakness: Secondary | ICD-10-CM | POA: Diagnosis not present

## 2020-07-22 DIAGNOSIS — F17210 Nicotine dependence, cigarettes, uncomplicated: Secondary | ICD-10-CM | POA: Diagnosis not present

## 2020-07-22 DIAGNOSIS — G8929 Other chronic pain: Secondary | ICD-10-CM | POA: Diagnosis not present

## 2020-07-22 DIAGNOSIS — M48061 Spinal stenosis, lumbar region without neurogenic claudication: Secondary | ICD-10-CM | POA: Insufficient documentation

## 2020-07-22 DIAGNOSIS — I6782 Cerebral ischemia: Secondary | ICD-10-CM | POA: Insufficient documentation

## 2020-07-22 DIAGNOSIS — R5383 Other fatigue: Secondary | ICD-10-CM | POA: Diagnosis not present

## 2020-07-22 DIAGNOSIS — C7931 Secondary malignant neoplasm of brain: Secondary | ICD-10-CM | POA: Diagnosis not present

## 2020-07-22 DIAGNOSIS — Z8249 Family history of ischemic heart disease and other diseases of the circulatory system: Secondary | ICD-10-CM | POA: Insufficient documentation

## 2020-07-22 DIAGNOSIS — C3490 Malignant neoplasm of unspecified part of unspecified bronchus or lung: Secondary | ICD-10-CM | POA: Insufficient documentation

## 2020-07-22 DIAGNOSIS — G893 Neoplasm related pain (acute) (chronic): Secondary | ICD-10-CM | POA: Diagnosis not present

## 2020-07-22 DIAGNOSIS — M2550 Pain in unspecified joint: Secondary | ICD-10-CM | POA: Diagnosis not present

## 2020-07-22 NOTE — Telephone Encounter (Signed)
Pt has been made aware of upcoming appts.

## 2020-07-22 NOTE — Telephone Encounter (Signed)
[name]- can you help get him scheduled? He is coming in today for radiation at 1:15pm and I can give him the appts at that time.

## 2020-07-23 ENCOUNTER — Ambulatory Visit
Admission: RE | Admit: 2020-07-23 | Discharge: 2020-07-23 | Disposition: A | Discharge status: Home / Self Care | Payer: Medicare HMO | Source: Ambulatory Visit | Attending: Radiation Oncology | Admitting: Radiation Oncology

## 2020-07-23 ENCOUNTER — Other Ambulatory Visit: Payer: Self-pay

## 2020-07-23 ENCOUNTER — Ambulatory Visit: Payer: Medicare HMO

## 2020-07-23 ENCOUNTER — Inpatient Hospital Stay: Payer: Medicare HMO

## 2020-07-23 DIAGNOSIS — R5383 Other fatigue: Secondary | ICD-10-CM | POA: Diagnosis not present

## 2020-07-23 DIAGNOSIS — C3412 Malignant neoplasm of upper lobe, left bronchus or lung: Secondary | ICD-10-CM | POA: Diagnosis not present

## 2020-07-23 DIAGNOSIS — M25512 Pain in left shoulder: Secondary | ICD-10-CM | POA: Diagnosis not present

## 2020-07-23 DIAGNOSIS — M2550 Pain in unspecified joint: Secondary | ICD-10-CM | POA: Diagnosis not present

## 2020-07-23 DIAGNOSIS — M549 Dorsalgia, unspecified: Secondary | ICD-10-CM | POA: Diagnosis not present

## 2020-07-23 DIAGNOSIS — C7931 Secondary malignant neoplasm of brain: Secondary | ICD-10-CM | POA: Diagnosis not present

## 2020-07-23 DIAGNOSIS — K4090 Unilateral inguinal hernia, without obstruction or gangrene, not specified as recurrent: Secondary | ICD-10-CM | POA: Diagnosis not present

## 2020-07-23 DIAGNOSIS — F17210 Nicotine dependence, cigarettes, uncomplicated: Secondary | ICD-10-CM | POA: Diagnosis not present

## 2020-07-23 DIAGNOSIS — C7951 Secondary malignant neoplasm of bone: Secondary | ICD-10-CM | POA: Diagnosis not present

## 2020-07-24 ENCOUNTER — Inpatient Hospital Stay: Payer: Medicare HMO

## 2020-07-24 ENCOUNTER — Ambulatory Visit: Payer: Medicare HMO

## 2020-07-24 ENCOUNTER — Encounter: Payer: Self-pay | Admitting: Internal Medicine

## 2020-07-24 ENCOUNTER — Ambulatory Visit
Admission: RE | Admit: 2020-07-24 | Discharge: 2020-07-24 | Disposition: A | Discharge status: Home / Self Care | Payer: Medicare HMO | Source: Ambulatory Visit | Attending: Radiation Oncology | Admitting: Radiation Oncology

## 2020-07-24 DIAGNOSIS — C7931 Secondary malignant neoplasm of brain: Secondary | ICD-10-CM | POA: Diagnosis not present

## 2020-07-24 DIAGNOSIS — K4090 Unilateral inguinal hernia, without obstruction or gangrene, not specified as recurrent: Secondary | ICD-10-CM | POA: Diagnosis not present

## 2020-07-24 DIAGNOSIS — M2550 Pain in unspecified joint: Secondary | ICD-10-CM | POA: Diagnosis not present

## 2020-07-24 DIAGNOSIS — C7951 Secondary malignant neoplasm of bone: Secondary | ICD-10-CM | POA: Diagnosis not present

## 2020-07-24 DIAGNOSIS — R5383 Other fatigue: Secondary | ICD-10-CM | POA: Diagnosis not present

## 2020-07-24 DIAGNOSIS — M25512 Pain in left shoulder: Secondary | ICD-10-CM | POA: Diagnosis not present

## 2020-07-24 DIAGNOSIS — C3412 Malignant neoplasm of upper lobe, left bronchus or lung: Secondary | ICD-10-CM | POA: Diagnosis not present

## 2020-07-24 DIAGNOSIS — M549 Dorsalgia, unspecified: Secondary | ICD-10-CM | POA: Diagnosis not present

## 2020-07-24 DIAGNOSIS — F17210 Nicotine dependence, cigarettes, uncomplicated: Secondary | ICD-10-CM | POA: Diagnosis not present

## 2020-07-25 ENCOUNTER — Other Ambulatory Visit: Payer: No Typology Code available for payment source

## 2020-07-25 ENCOUNTER — Inpatient Hospital Stay: Payer: Medicare HMO

## 2020-07-25 ENCOUNTER — Ambulatory Visit
Admission: RE | Admit: 2020-07-25 | Discharge: 2020-07-25 | Disposition: A | Discharge status: Home / Self Care | Payer: Medicare HMO | Source: Ambulatory Visit | Attending: Radiation Oncology | Admitting: Radiation Oncology

## 2020-07-25 ENCOUNTER — Ambulatory Visit: Payer: Medicare HMO

## 2020-07-25 DIAGNOSIS — K4090 Unilateral inguinal hernia, without obstruction or gangrene, not specified as recurrent: Secondary | ICD-10-CM | POA: Diagnosis not present

## 2020-07-25 DIAGNOSIS — R5383 Other fatigue: Secondary | ICD-10-CM | POA: Diagnosis not present

## 2020-07-25 DIAGNOSIS — C3412 Malignant neoplasm of upper lobe, left bronchus or lung: Secondary | ICD-10-CM | POA: Diagnosis not present

## 2020-07-25 DIAGNOSIS — F17210 Nicotine dependence, cigarettes, uncomplicated: Secondary | ICD-10-CM | POA: Diagnosis not present

## 2020-07-25 DIAGNOSIS — M25512 Pain in left shoulder: Secondary | ICD-10-CM | POA: Diagnosis not present

## 2020-07-25 DIAGNOSIS — M549 Dorsalgia, unspecified: Secondary | ICD-10-CM | POA: Diagnosis not present

## 2020-07-25 DIAGNOSIS — C7931 Secondary malignant neoplasm of brain: Secondary | ICD-10-CM | POA: Diagnosis not present

## 2020-07-25 DIAGNOSIS — C7951 Secondary malignant neoplasm of bone: Secondary | ICD-10-CM | POA: Diagnosis not present

## 2020-07-25 DIAGNOSIS — M2550 Pain in unspecified joint: Secondary | ICD-10-CM | POA: Diagnosis not present

## 2020-07-26 ENCOUNTER — Other Ambulatory Visit: Payer: Medicare HMO

## 2020-07-26 ENCOUNTER — Inpatient Hospital Stay: Payer: Medicare HMO

## 2020-07-26 ENCOUNTER — Encounter: Payer: Self-pay | Admitting: *Deleted

## 2020-07-26 ENCOUNTER — Ambulatory Visit: Payer: Medicare HMO

## 2020-07-26 ENCOUNTER — Inpatient Hospital Stay (HOSPITAL_BASED_OUTPATIENT_CLINIC_OR_DEPARTMENT_OTHER): Payer: Medicare HMO | Admitting: Internal Medicine

## 2020-07-26 ENCOUNTER — Telehealth: Payer: Self-pay | Admitting: Nurse Practitioner

## 2020-07-26 ENCOUNTER — Encounter: Payer: Self-pay | Admitting: Internal Medicine

## 2020-07-26 ENCOUNTER — Inpatient Hospital Stay: Payer: Medicare HMO | Admitting: Internal Medicine

## 2020-07-26 ENCOUNTER — Other Ambulatory Visit: Payer: Self-pay

## 2020-07-26 ENCOUNTER — Inpatient Hospital Stay (HOSPITAL_BASED_OUTPATIENT_CLINIC_OR_DEPARTMENT_OTHER): Payer: Medicare HMO | Admitting: Hospice and Palliative Medicine

## 2020-07-26 ENCOUNTER — Other Ambulatory Visit: Payer: Self-pay | Admitting: *Deleted

## 2020-07-26 VITALS — BP 98/62 | HR 80 | Temp 97.8°F | Resp 18

## 2020-07-26 DIAGNOSIS — C3412 Malignant neoplasm of upper lobe, left bronchus or lung: Secondary | ICD-10-CM

## 2020-07-26 DIAGNOSIS — F17210 Nicotine dependence, cigarettes, uncomplicated: Secondary | ICD-10-CM | POA: Diagnosis not present

## 2020-07-26 DIAGNOSIS — R110 Nausea: Secondary | ICD-10-CM | POA: Diagnosis not present

## 2020-07-26 DIAGNOSIS — M549 Dorsalgia, unspecified: Secondary | ICD-10-CM | POA: Diagnosis not present

## 2020-07-26 DIAGNOSIS — Z515 Encounter for palliative care: Secondary | ICD-10-CM

## 2020-07-26 DIAGNOSIS — M2550 Pain in unspecified joint: Secondary | ICD-10-CM | POA: Diagnosis not present

## 2020-07-26 DIAGNOSIS — F1020 Alcohol dependence, uncomplicated: Secondary | ICD-10-CM

## 2020-07-26 DIAGNOSIS — G893 Neoplasm related pain (acute) (chronic): Secondary | ICD-10-CM

## 2020-07-26 DIAGNOSIS — E860 Dehydration: Secondary | ICD-10-CM

## 2020-07-26 DIAGNOSIS — C3490 Malignant neoplasm of unspecified part of unspecified bronchus or lung: Secondary | ICD-10-CM | POA: Diagnosis not present

## 2020-07-26 DIAGNOSIS — K4090 Unilateral inguinal hernia, without obstruction or gangrene, not specified as recurrent: Secondary | ICD-10-CM | POA: Diagnosis not present

## 2020-07-26 DIAGNOSIS — C7951 Secondary malignant neoplasm of bone: Secondary | ICD-10-CM | POA: Diagnosis not present

## 2020-07-26 DIAGNOSIS — C7931 Secondary malignant neoplasm of brain: Secondary | ICD-10-CM | POA: Diagnosis not present

## 2020-07-26 DIAGNOSIS — R5383 Other fatigue: Secondary | ICD-10-CM | POA: Diagnosis not present

## 2020-07-26 DIAGNOSIS — M25512 Pain in left shoulder: Secondary | ICD-10-CM | POA: Diagnosis not present

## 2020-07-26 LAB — COMPREHENSIVE METABOLIC PANEL
ALT: 26 U/L (ref 0–44)
AST: 14 U/L — ABNORMAL LOW (ref 15–41)
Albumin: 3.3 g/dL — ABNORMAL LOW (ref 3.5–5.0)
Alkaline Phosphatase: 64 U/L (ref 38–126)
Anion gap: 9 (ref 5–15)
BUN: 17 mg/dL (ref 8–23)
CO2: 24 mmol/L (ref 22–32)
Calcium: 9.1 mg/dL (ref 8.9–10.3)
Chloride: 104 mmol/L (ref 98–111)
Creatinine, Ser: 0.79 mg/dL (ref 0.61–1.24)
GFR, Estimated: >60 mL/min (ref >60)
Glucose, Bld: 89 mg/dL (ref 70–99)
Potassium: 4.2 mmol/L (ref 3.5–5.1)
Sodium: 137 mmol/L (ref 135–145)
Total Bilirubin: 0.9 mg/dL (ref 0.3–1.2)
Total Protein: 6.8 g/dL (ref 6.5–8.1)

## 2020-07-26 LAB — CBC WITH DIFFERENTIAL/PLATELET
Abs Immature Granulocytes: 0.05 10*3/uL (ref 0.00–0.07)
Basophils Absolute: 0.1 10*3/uL (ref 0.0–0.1)
Basophils Relative: 1 %
Eosinophils Absolute: 0 10*3/uL (ref 0.0–0.5)
Eosinophils Relative: 0 %
HCT: 26.6 % — ABNORMAL LOW (ref 39.0–52.0)
Hemoglobin: 8.7 g/dL — ABNORMAL LOW (ref 13.0–17.0)
Immature Granulocytes: 1 %
Lymphocytes Relative: 8 %
Lymphs Abs: 0.8 10*3/uL (ref 0.7–4.0)
MCH: 33 pg (ref 26.0–34.0)
MCHC: 32.7 g/dL (ref 30.0–36.0)
MCV: 100.8 fL — ABNORMAL HIGH (ref 80.0–100.0)
Monocytes Absolute: 1.2 10*3/uL — ABNORMAL HIGH (ref 0.1–1.0)
Monocytes Relative: 12 %
Neutro Abs: 8 10*3/uL — ABNORMAL HIGH (ref 1.7–7.7)
Neutrophils Relative %: 78 %
Platelets: 453 10*3/uL — ABNORMAL HIGH (ref 150–400)
RBC: 2.64 MIL/uL — ABNORMAL LOW (ref 4.22–5.81)
RDW: 14.8 % (ref 11.5–15.5)
WBC: 10 10*3/uL (ref 4.0–10.5)
nRBC: 0 % (ref 0.0–0.2)

## 2020-07-26 MED ORDER — DEXAMETHASONE SODIUM PHOSPHATE 10 MG/ML IJ SOLN
10.0000 mg | Freq: Once | INTRAMUSCULAR | Status: AC
  Administered 2020-07-26: 10 mg via INTRAVENOUS
  Filled 2020-07-26: qty 1

## 2020-07-26 MED ORDER — MORPHINE SULFATE (PF) 2 MG/ML IV SOLN
2.0000 mg | Freq: Once | INTRAVENOUS | Status: AC
  Administered 2020-07-26: 2 mg via INTRAVENOUS
  Filled 2020-07-26: qty 1

## 2020-07-26 MED ORDER — OXYCODONE HCL ER 15 MG PO T12A: 15.0000 mg | EXTENDED_RELEASE_TABLET | Freq: Three times a day (TID) | ORAL | 0 refills | Status: DC

## 2020-07-26 MED ORDER — ONDANSETRON HCL 4 MG/2ML IJ SOLN
8.0000 mg | Freq: Once | INTRAMUSCULAR | Status: AC
  Administered 2020-07-26: 8 mg via INTRAVENOUS
  Filled 2020-07-26: qty 4

## 2020-07-26 MED ORDER — ONDANSETRON HCL 4 MG PO TABS: 4.0000 mg | ORAL_TABLET | Freq: Three times a day (TID) | ORAL | 0 refills | Status: AC | PRN

## 2020-07-26 MED ORDER — OXYCODONE HCL 10 MG PO TABS: 10.0000 mg | ORAL_TABLET | ORAL | 0 refills | Status: DC | PRN

## 2020-07-26 MED ORDER — SODIUM CHLORIDE 0.9 % IV SOLN
INTRAVENOUS | Status: DC
  Filled 2020-07-26 (×2): qty 250

## 2020-07-26 MED ORDER — PROCHLORPERAZINE MALEATE 10 MG PO TABS: 10.0000 mg | ORAL_TABLET | Freq: Four times a day (QID) | ORAL | 0 refills | Status: AC | PRN

## 2020-07-26 NOTE — Assessment & Plan Note (Addendum)
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  Awaiting NGS testing.  Order MRI brain.  #Discussed with the patient the incurability of his malignancy; at best treatment could be offered to palliative symptoms.  However patient has poor social support/concern for noncompliance which could be a huge barrier for his systemic therapies.  Discussed with [employer].  # Nausea/vomitting-etiology is unclear.  Recommend MRI brain ASAP.  # pain not controlled-poorly controlled; recommend OxyContin 15 every 8 hours; along with breakthrough pain medication.  # debility/fell off the bed-question neurologic mechanical/musculoskeletal.  Await brain MRI  # DISPOSITION: # labs today- cbc/cmp/TSH # MRI brain ASAP # IVFs over 1 hour;DEX; zofran # follow up on 3/21- MD; labs- cbc/cmp; possible IVFs; dex  # 40 minutes face-to-face with the patient discussing the above plan of care; more than 50% of time spent on prognosis/ natural history; counseling and coordination.   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  I tried to reach the patient; unavailable.  I spoke to patient's brother Linna Hoff regarding the results of the MRI brain.  Discussed with radiation oncology plan simulation/whole brain RT.  We will start the patient on steroids; and also baclofen patient's prior home medication.

## 2020-07-26 NOTE — Telephone Encounter (Signed)
Called patient to offer to schedule a Palliative Consult and rec'd message stating:  "The person you called cannot receive calls at this time".  Will try again later.

## 2020-07-26 NOTE — Progress Notes (Signed)
Patient came into clinic with pain and vomiting. IVF's initiated with supportive meds. Patient also received IV morphine for pain. Pt had no vomiting once fluids initiated. He ate 2 packages of peanut butter crackers and drank a can of ginger ale. He reports feeling so much better at time of discharge.No nausea very little pain. Pt is in wheelchair. Taken to lobby for [facility] transportation home . Call placed for van pick up.

## 2020-07-26 NOTE — Progress Notes (Signed)
Kevin Baker CONSULT NOTE  Patient Care Team: Patient, No Pcp Per as PCP - General (General Practice)  CHIEF COMPLAINTS/PURPOSE OF CONSULTATION:   #  Oncology History Overview Note  # MARCH 2022- LEFT LUNG CA with mets to left femur [s/p Bronch; Dr.Aleskerov]; s/p RT to left femur   MARCH 11th- MRI Brain mets  # NGS/MOLECULAR TESTS:PDL-1 = 0%.     # PALLIATIVE CARE EVALUATION: Kevin Baker  # PAIN MANAGEMENT:    DIAGNOSIS: Left lung cancer/  STAGE:  IV       ;  GOALS: Palliative  CURRENT/MOST RECENT THERAPY : RT     Primary cancer of left upper lobe of lung ([place])  07/17/2020 Initial Diagnosis   Primary cancer of left upper lobe of lung ([place])   07/29/2020 Cancer Staging   Staging form: Lung, AJCC 8th Edition - Clinical: Stage IVB (cT2, cN0, pM1c) - Signed by Cammie Sickle, MD on 07/29/2020 Stage prefix: Initial diagnosis      HISTORY OF PRESENTING ILLNESS:  Kevin Baker 65 y.o.  male longstanding history of smoking was recently admitted to hospital for significant left hip pain.  Further work-up showed left upper lobe mass; highly consistent with malignancy.  Patient subsequently underwent bronchoscopy with biopsy positive for malignancy.  Given intractable left hip pain patient underwent evaluation with radiation oncology-patient is currently undergoing radiation to the left hip.  As per orthopedics patient felt not a significant risk of pathologic fracture.   Patient states that he fell when getting out of bed yesterday.  Unfortunately continues to smoke.  He is not really sure of his medications.    Review of Systems  Constitutional: Positive for malaise/fatigue and weight loss. Negative for chills, diaphoresis and fever.  HENT: Negative for nosebleeds and sore throat.   Eyes: Negative for double vision.  Respiratory: Positive for cough and shortness of breath. Negative for hemoptysis, sputum production and wheezing.   Cardiovascular: Negative for  chest pain, palpitations, orthopnea and leg swelling.  Gastrointestinal: Negative for abdominal pain, blood in stool, constipation, diarrhea, heartburn, melena, nausea and vomiting.  Genitourinary: Negative for dysuria, frequency and urgency.  Musculoskeletal: Positive for back pain and joint pain.  Skin: Negative.  Negative for itching and rash.  Neurological: Negative for dizziness, tingling, focal weakness, weakness and headaches.       Falls.  Endo/Heme/Allergies: Does not bruise/bleed easily.  Psychiatric/Behavioral: Negative for depression. The patient is not nervous/anxious and does not have insomnia.      MEDICAL HISTORY:  Past Medical History:  Diagnosis Date  . Alcohol use disorder, severe, dependence ([place])   . Mass of lower lobe of left lung     SURGICAL HISTORY: Past Surgical History:  Procedure Laterality Date  . BACK SURGERY    . LEG SURGERY Right   . PELVIC FRACTURE SURGERY    . VIDEO BRONCHOSCOPY WITH ENDOBRONCHIAL NAVIGATION Left 07/12/2020   Procedure: VIDEO BRONCHOSCOPY WITH ENDOBRONCHIAL NAVIGATION;  Surgeon: Ottie Glazier, MD;  Location: [facility] ORS;  Service: Thoracic;  Laterality: Left;    SOCIAL HISTORY: Social History   Socioeconomic History  . Marital status: Married    Spouse name: Not on file  . Number of children: Not on file  . Years of education: Not on file  . Highest education level: Not on file  Occupational History  . Not on file  Tobacco Use  . Smoking status: Current Every Day Smoker    Packs/day: 3.00    Types: Cigarettes  . Smokeless  tobacco: Never Used  Substance and Sexual Activity  . Alcohol use: Not Currently    Comment: for last 3 weeks, drinking daily   . Drug use: Not Currently  . Sexual activity: Not on file  Other Topics Concern  . Not on file  Social History Narrative  . Not on file   Social Determinants of Health   Financial Resource Strain: Not on file  Food Insecurity: Not on file  Transportation Needs: Not on  file  Physical Activity: Not on file  Stress: Not on file  Social Connections: Not on file  Intimate Partner Violence: Not on file    FAMILY HISTORY: Family History  Problem Relation Age of Onset  . Heart disease Father     ALLERGIES:  is allergic to codeine, sulfamethoxazole-trimethoprim, lactose intolerance (gi), and no known allergies.  MEDICATIONS:  Current Outpatient Medications  Medication Sig Dispense Refill  . acetaminophen (TYLENOL) 325 MG tablet Take 500 mg by mouth every 6 (six) hours as needed.    . baclofen (LIORESAL) 10 MG tablet Take 10 mg by mouth 3 (three) times daily as needed for muscle spasms.    . baclofen 5 MG TABS Take 10 mg by mouth 3 (three) times daily. 60 tablet 0  . buPROPion (WELLBUTRIN SR) 150 MG 12 hr tablet Take 150 mg by mouth daily.    . Cholecalciferol 50 MCG (2000 UT) TABS Take 2,000 Units by mouth daily.    [name] dexamethasone (DECADRON) 4 MG tablet Take 1 tablet (4 mg total) by mouth 2 (two) times daily. 30 tablet 0  . diclofenac Sodium (VOLTAREN) 1 % GEL Apply 2-4 g topically 4 (four) times daily as needed.    . feeding supplement (ENSURE ENLIVE / ENSURE PLUS) LIQD Take 237 mLs by mouth 3 (three) times daily between meals. 54270 mL 0  . folic acid (FOLVITE) 1 MG tablet Take 1 mg by mouth daily.    [name] gabapentin (NEURONTIN) 300 MG capsule Take 300 mg by mouth 3 (three) times daily.    . hydrOXYzine (VISTARIL) 25 MG capsule Take 25 mg by mouth 4 (four) times daily as needed for anxiety.    . Multiple Vitamin (MULTIVITAMIN WITH MINERALS) TABS tablet Take 1 tablet by mouth daily.    . naloxone (NARCAN) nasal spray 4 mg/0.1 mL SPRAY 1 SPRAY INTO ONE NOSTRIL AS DIRECTED FOR OPIOID OVERDOSE (TURN PERSON ON SIDE AFTER DOSE. IF NO RESPONSE IN 2-3 MINUTES OR PERSON RESPONDS BUT RELAPSES, REPEAT USING A NEW SPRAY DEVICE AND SPRAY INTO THE OTHER NOSTRIL. CALL 911 AFTER USE.) * EMERGENCY USE ONLY *    . nicotine polacrilex (COMMIT) 4 MG lozenge DISSOLVE 1 LOZENGE  MOUTH EVERY HOUR AS NEEDED TO HELP STOP SMOKING ** CALL [facility] [phone] FOR INFORMATION ABOUT SUPPORT IN QUITTING.  - MAX 24 PIECES IN 24 HOURS TO HELP STOP SMOKING ** CALL [facility] [phone] FOR INFORMATION ABOUT SUPPORT IN QUITTING.  - MAX 24 PIECES IN 24 HOURS 108 tablet 1  . omeprazole (PRILOSEC) 20 MG capsule Take 1 capsule (20 mg total) by mouth daily. 30 capsule 0  . traZODone (DESYREL) 50 MG tablet Take 50 mg by mouth at bedtime as needed.    . lidocaine (LIDODERM) 5 % Place 1 patch onto the skin See admin instructions. Apply 1 patch to the skin once a day to painful areas for 12 hours, then remove. (Patient not taking: No sig reported)    . ondansetron (ZOFRAN) 4 MG tablet  Take 1 tablet (4 mg total) by mouth every 8 (eight) hours as needed for nausea or vomiting. 45 tablet 0  . oxyCODONE (OXYCONTIN) 15 mg 12 hr tablet Take 1 tablet (15 mg total) by mouth every 8 (eight) hours. 90 tablet 0  . Oxycodone HCl 10 MG TABS Take 1 tablet (10 mg total) by mouth every 4 (four) hours as needed. 60 tablet 0  . prochlorperazine (COMPAZINE) 10 MG tablet Take 1 tablet (10 mg total) by mouth every 6 (six) hours as needed for nausea or vomiting. 45 tablet 0   No current facility-administered medications for this visit.      [name]  PHYSICAL EXAMINATION: ECOG PERFORMANCE STATUS: 3 - Symptomatic, >50% confined to bed  Vitals:   07/26/20 0931  BP: (!) 85/53  Pulse: 98  Resp: 18  Temp: 99.7 F (37.6 C)  SpO2: 94%   Filed Weights   07/26/20 0931  Weight: 141 lb (64 kg)    Physical Exam Constitutional:      Comments: Thin built cachectic male patient.  Is in a wheelchair.  Complains of left hip pain.  HENT:     Head: Normocephalic and atraumatic.     Mouth/Throat:     Pharynx: No oropharyngeal exudate.  Eyes:     Pupils: Pupils are equal, round, and reactive to light.  Cardiovascular:     Rate and Rhythm: Normal rate and regular rhythm.  Pulmonary:     Effort: No  respiratory distress.     Breath sounds: No wheezing.     Comments: Decreased air entry bilaterally. Abdominal:     General: Bowel sounds are normal. There is no distension.     Palpations: Abdomen is soft. There is no mass.     Tenderness: There is no abdominal tenderness. There is no guarding or rebound.  Musculoskeletal:        General: No tenderness. Normal range of motion.     Cervical back: Normal range of motion and neck supple.  Skin:    General: Skin is warm.  Neurological:     Mental Status: He is alert and oriented to person, place, and time.  Psychiatric:        Mood and Affect: Affect normal.      LABORATORY DATA:  I have reviewed the data as listed Lab Results  Component Value Date   WBC 10.0 07/26/2020   HGB 8.7 (L) 07/26/2020   HCT 26.6 (L) 07/26/2020   MCV 100.8 (H) 07/26/2020   PLT 453 (H) 07/26/2020   Recent Labs    05/13/20 1557 05/14/20 0547 07/09/20 2120 07/10/20 0608 07/14/20 0743 07/16/20 0822 07/26/20 1107  NA 137   < >  --    < > 133* 134* 137  K 3.6   < >  --    < > 3.9 4.1 4.2  CL 102   < >  --    < > 94* 95* 104  CO2 25   < >  --    < > _0 GLUCOSE 95   < >  --    < > 105* 111* 89  BUN 9   < >  --    < > _1 CREATININE 0.58*   < >  --    < > 0.53* 0.50* 0.79  CALCIUM 8.9   < >  --    < > 8.8* 8.7* 9.1  GFRNONAA >60   < >  --    < > >  60 >60 >60  PROT 6.9  --  7.2  --   --   --  6.8  ALBUMIN 3.3*  --  3.4*  --   --   --  3.3*  AST 17  --  19  --   --   --  14*  ALT 7  --  16  --   --   --  26  ALKPHOS 50  --  66  --   --   --  64  BILITOT 0.8  --  0.5  --   --   --  0.9  BILIDIR  --   --  <0.1  --   --   --   --   IBILI  --   --  NOT CALCULATED  --   --   --   --    < > = values in this interval not displayed.    RADIOGRAPHIC STUDIES: I have personally reviewed the radiological images as listed and agreed with the findings in the report. MR Brain W Wo Contrast  Result Date: 07/27/2020 CLINICAL DATA:  Non-small cell  lung cancer staging. Left lower extremity weakness. EXAM: MRI HEAD WITHOUT AND WITH CONTRAST TECHNIQUE: Multiplanar, multiecho pulse sequences of the brain and surrounding structures were obtained without and with intravenous contrast. CONTRAST:  44m GADAVIST GADOBUTROL 1 MMOL/ML IV SOLN COMPARISON:  None. FINDINGS: Brain: No acute infarct, intracranial hemorrhage, midline shift, or extra-axial fluid collection is identified. Small chronic infarcts are noted in the cerebellum bilaterally, and there are small chronic cortical infarcts in the superior aspects of both occipital lobes and right frontal lobe. T2 hyperintensities in the cerebral white matter bilaterally are nonspecific but compatible with mild-to-moderate chronic small vessel ischemic disease. There is moderate generalized cerebral atrophy. There is an 8 mm enhancing lesion in the medial aspect of the left cerebellar hemisphere with mild to moderate surrounding edema (series 18, image 45). There is no significant posterior fossa mass effect. There is a 7 mm enhancing lesion anteriorly in the left frontal lobe without edema (series 18, image 85). There is a 4 mm focus of enhancement in the anterior right temporal lobe on axial imaging (series 18, image 57) without edema. This is not visible on coronal or sagittal imaging, potentially due to slice selection, and this may reflect a metastasis or be vascular. There is thin diffuse dural enhancement over both cerebral convexities without nodularity. Vascular: Major intracranial vascular flow voids are preserved. Skull and upper cervical spine: Unremarkable bone marrow signal. Sinuses/Orbits: Unremarkable orbits. Mild mucosal thickening in the maxillary sinuses. Clear mastoid air cells. Other: None. IMPRESSION: 1. Small enhancing lesions in the cerebellum and left frontal lobe consistent with metastases. Mild-to-moderate edema surrounding the left cerebellar metastasis without significant mass effect. 2. 4 mm  focus of enhancement in the anterior right temporal lobe which may reflect a third metastasis or be vascular. 3. Mild nonspecific smooth dural enhancement over both cerebral convexities. No nodularity to strongly suggest dural metastatic disease. 4. Mild-to-moderate chronic small vessel ischemic disease and moderate cerebral atrophy with multiple chronic infarcts. 5. Electronically Signed   By: ALogan BoresM.D.   On: 07/27/2020 13:21   MR Lumbar Spine W Wo Contrast  Result Date: 07/11/2020 CLINICAL DATA:  65year old male with low back and left lower extremity pain progressed x3 months. Spiculated left lower lobe lung mass in December. EXAM: MRI LUMBAR SPINE WITHOUT AND WITH CONTRAST TECHNIQUE: Multiplanar and multiecho pulse sequences of the  lumbar spine were obtained without and with intravenous contrast. CONTRAST:  71m GADAVIST GADOBUTROL 1 MMOL/ML IV SOLN COMPARISON:  CT Chest, Abdomen, and Pelvis 05/14/2020. FINDINGS: Segmentation:  Normal on the December CT. Alignment: Grade 2 spondylolisthesis of L4 on L5, and grade 1 anterolisthesis of L5 on S1 are stable since December. No associated pars fractures. Underlying mild dextroconvex lumbar scoliosis. Vertebrae: Abnormal decreased T1 signal, STIR hyperintensity and patchy enhancement in the left sacral ala partially visible on series 7, image 11. Elsewhere the visible sacrum and left SI joint appear intact. However, left SI joint ankylosis was demonstrated on the prior CT. Subtle semicircular area of increased STIR signal in the left posterolateral L2 vertebral body (series 7, image 9) demonstrates mild enhancement, and a subtle sclerotic lesion was present here in December. However, axial MRI appearance today raises the possibility of degenerative Schmorl's node (series 8, image 15). The lesion is 16 mm diameter by 8 mm thick. Otherwise normal marrow signal in the visible lower thoracic and lumbar spine. Mild chronic T11 superior endplate compression is  stable since December. Conus medullaris and cauda equina: Conus extends to the T12-L1 level. No lower spinal cord or conus signal abnormality. No abnormal intradural enhancement. No dural thickening. Cauda equina nerve roots appear normal. Paraspinal and other soft tissues: Partially visible distended urinary bladder. Trace free fluid in the pelvis. Otherwise negative visible abdominal viscera. Negative lumbar paraspinal soft tissues. Disc levels: Capacious lower thoracic and lumbar spinal canal above L4. L4-L5: Grade 2 spondylolisthesis with advanced disc and posterior element degeneration. Severe facet hypertrophy greater on the right with degenerative facet joint fluid. Moderate spinal stenosis and bilateral lateral recess stenosis. Moderate to severe bilateral L4 foraminal stenosis. L5-S1: Mild anterolisthesis with disc and posterior element degeneration but no spinal stenosis. There is moderate right L5 foraminal stenosis. IMPRESSION: 1. Partially visible abnormal signal in the left sacral ala with patchy enhancement. This does not seem mass-like, and more resembles a sacral insufficiency fracture than metastatic disease (but see also #2). 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 2. Small 5 x 16 mm enhancing lesion in the L2 posterosuperior vertebral body is indeterminate for small bone Metastasis versus Schmorl's node. Follow-up PET-CT might best characterize further. Failing MAC, short interval 2-3 month follow-up MRI would be most valuable. 3. No other metastatic disease identified in the visible lower thoracic or lumbar spine. 4. Degenerative grade 2 spondylolisthesis at L4-L5 with advanced disc and posterior element degeneration, moderate spinal, lateral recess, and moderate to severe foraminal stenosis. Grade 1 spondylolisthesis at L5-S1 with moderate right foraminal stenosis. Electronically Signed   By: HGenevie AnnM.D.   On: 07/11/2020 08:10   CT Hip Left Wo Contrast  Result Date:  07/10/2020 CLINICAL DATA:  Left leg and arm weakness with pain since March 2021, negative radiographs EXAM: CT OF THE LEFT HIP WITHOUT CONTRAST TECHNIQUE: Multidetector CT imaging of the left hip was performed according to the standard protocol. Multiplanar CT image reconstructions were also generated. COMPARISON:  Radiograph 07/09/2020, CT 05/14/2020 FINDINGS: Bones/Joint/Cartilage The osseous structures appear diffusely demineralized which may limit detection of small or nondisplaced fractures. Ovoid lucent lesion centered within the lesser trochanter measuring approximately 2.6 x 2.3 x 3.1 cm in size. No clear associated pathologic fracture is evident at this time. No other suspicious lytic or blastic lesions within the included margins of imaging. No other acute bony abnormality. Specifically, no fracture, subluxation, or dislocation of the left hip or included bony pelvis. More remote remote  posttraumatic changes of the left pubic root, superior and inferior rami, unchanged from comparison CT. Ankylosis left SI joint and likely prior traumatic deformity of the left ilium. Degenerative change at the pubic symphysis and left hip is similar to comparison. No sizable hip effusion. Ligaments Suboptimally assessed by CT. Muscles and Tendons No acute musculotendinous abnormality is seen. Soft tissues Atherosclerotic calcifications in the iliac arteries and proximal common and superficial femoral arteries. Circumferential thickening of the urinary bladder though possibly related to underdistention. Fluid-filled appearance of the colon, correlate for rapid transit state. IMPRESSION: 1. Ovoid lucent lesion centered within the left lesser trochanter measuring approximately 2.6 x 2.3 x 3.1 cm in size. No clear associated pathologic fracture is evident at this time. Findings concerning for metastatic lesion given known left lung lesion. 2. Remote posttraumatic changes of the left pubic root, superior and inferior rami, and  left ilium unchanged from comparison CT. 3. Ankylosis left SI joint. 4. Circumferential thickening of the urinary bladder though possibly related to underdistention. Correlate with urinalysis to exclude cystitis. 5. Fluid-filled appearance of the colon, could reflect diarrheal illness/rapid transit state. 6. Atherosclerosis These results were called by telephone at the time of interpretation on 07/10/2020 at 12:23 am to provider Dr. Tamala Julian, who verbally acknowledged these results. Electronically Signed   By: Lovena Le M.D.   On: 07/10/2020 00:23   DG C-Arm 1-60 Min-No Report  Result Date: 07/12/2020 Fluoroscopy was utilized by the requesting physician.  No radiographic interpretation.   DG Hip Unilat W or Wo Pelvis 2-3 Views Left  Result Date: 07/09/2020 CLINICAL DATA:  Acute on chronic hip pain EXAM: DG HIP (WITH OR WITHOUT PELVIS) 2-3V LEFT COMPARISON:  05/13/2020 FINDINGS: Chronic deformity of left iliac bone and pubic rami. No acute displaced fracture or malalignment is seen. There are mild degenerative changes of both hips. Vascular calcifications. IMPRESSION: No acute osseous abnormality. Chronic deformity of left iliac bone and pubic rami. Electronically Signed   By: Donavan Foil M.D.   On: 07/09/2020 21:25   CT Super D Chest W Contrast  Result Date: 07/11/2020 CLINICAL DATA:  Left lower lobe mass, EBUS planning EXAM: CT CHEST WITH CONTRAST TECHNIQUE: Multidetector CT imaging of the chest was performed using thin slice collimation for electromagnetic bronchoscopy planning purposes, with intravenous contrast. CONTRAST:  65m OMNIPAQUE IOHEXOL 300 MG/ML  SOLN COMPARISON:  CT chest, 05/14/2020 FINDINGS: Cardiovascular: Aortic atherosclerosis. Normal heart size. Three-vessel coronary artery calcifications and/or stents. No pericardial effusion. Mediastinum/Nodes: No enlarged mediastinal, hilar, or axillary lymph nodes. Thyroid gland, trachea, and esophagus demonstrate no significant findings.  Lungs/Pleura: Slight interval enlargement of a large mass of the superior segment left lower lobe, measuring 4.9 x 4.4 cm, previously 4.6 x 3.8 cm when measured similarly (series 3, image 40). There is postobstructive consolidation and nodularity of the dependent left lower lobe (series 3, image 51). Significant interval enlargement of a spiculated appearing nodule of the anterior left upper lobe measuring 1.4 x 1.2 cm, previously 1.1 x 1.0 cm when measured similarly (series 3, image 37). Unchanged small nodule of the superior segment right lower lobe measuring 4 mm (series 3, image 27). Slight interval enlargement of a nodule of the peripheral right upper lobe measuring 3 mm, previously no greater than 1-2 mm (series 3, image 35). Minimal centrilobular emphysema. No pleural effusion or pneumothorax. Upper Abdomen: No acute abnormality. Musculoskeletal: No chest wall mass or suspicious bone lesions identified. IMPRESSION: 1. Slight interval enlargement of a large mass of the superior segment  left lower lobe, measuring 4.9 x 4.4 cm, previously 4.6 x 3.8 cm when measured similarly. There is postobstructive consolidation and nodularity of the dependent left lower lobe. Findings are consistent with primary lung malignancy. 2. Significant interval enlargement of a spiculated appearing nodule of the anterior left upper lobe measuring 1.4 x 1.2 cm, previously 1.1 x 1.0 cm when measured similarly. This is concerning for a synchronous primary versus metastasis. 3. Slight interval enlargement of a nodule of the peripheral right upper lobe measuring 3 mm, previously no greater than 1-2 mm. This is again concerning for synchronous primary or metastasis. 4. Unchanged small nodule of the superior segment right lower lobe measuring 4 mm, nonspecific. 5. No mediastinal lymphadenopathy. 6. Minimal centrilobular emphysema. 7. Coronary artery disease. Aortic Atherosclerosis (ICD10-I70.0) and Emphysema (ICD10-J43.9). Electronically  Signed   By: Eddie Candle M.D.   On: 07/11/2020 14:10    ASSESSMENT & PLAN:   Primary cancer of left upper lobe of lung ([place]) #Stage IV LUL lung cancer--non-small cell favor adenocarcinoma; metastasis to the bone.  Awaiting NGS testing.  Order MRI brain.  #Discussed with the patient the incurability of his malignancy; at best treatment could be offered to palliative symptoms.  However patient has poor social support/concern for noncompliance which could be a huge barrier for his systemic therapies.  Discussed with [employer].  # Nausea/vomitting-etiology is unclear.  Recommend MRI brain ASAP.  # pain not controlled-poorly controlled; recommend OxyContin 15 every 8 hours; along with breakthrough pain medication.  # debility/fell off the bed-question neurologic mechanical/musculoskeletal.  Await brain MRI  # DISPOSITION: # labs today- cbc/cmp/TSH # MRI brain ASAP # IVFs over 1 hour;DEX; zofran # follow up on 3/21- MD; labs- cbc/cmp; possible IVFs; dex  # 40 minutes face-to-face with the patient discussing the above plan of care; more than 50% of time spent on prognosis/ natural history; counseling and coordination.   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  I tried to reach the patient; unavailable.  I spoke to patient's brother Linna Hoff regarding the results of the MRI brain.  Discussed with radiation oncology plan simulation/whole brain RT.  We will start the patient on steroids; and also baclofen patient's prior home medication.     All questions were answered. The patient knows to call the clinic with any problems, questions or concerns.       Cammie Sickle, MD 07/29/2020 8:07 PM

## 2020-07-26 NOTE — Progress Notes (Signed)
Has severe pain in right leg. States he has been feeling dizzy. Has nausea today.

## 2020-07-26 NOTE — Progress Notes (Signed)
  Oncology Nurse Navigator Documentation  Navigator Location: [facility] (07/26/20 1200)   )Navigator Encounter Type: Follow-up Appt (07/26/20 1200)   Abnormal Finding Date: 07/09/20 (07/26/20 1200) Confirmed Diagnosis Date: 07/16/20 (07/26/20 1200)             Treatment Initiated Date: 07/22/20 (07/26/20 1200) Patient Visit Type: MedOnc (07/26/20 1200)   Barriers/Navigation Needs: Coordination of Care;Pain;Health Literacy (07/26/20 1200)   Interventions: Coordination of Care (07/26/20 1200)   Coordination of Care: Appts;Radiology (07/26/20 1200)        Acuity: Level 3-Moderate Needs (3-4 Barriers Identified) (07/26/20 1200)    met with patient during follow up visit with Dr. Rogue Bussing to discuss next steps after radiation. Pt was not feeling well during visit and was added on for IVF today. All questions answered during visit. Phone call made to pt's brother who cares for pt at the home. Discussed with brother the need for his presence during follow up visit on 3/21 to help make decisions. Pt verbalized understanding and stated will bring him for appt that day. Transportation has been cancelled per brother's request. Informed that pt has copy of appts but appt details discussed with brother over the phone as well. Brother's contact number added to pt's chart. Pt has contact info but contact info given to brother while on the phone and instructed to call with any questions or needs. Linna Hoff, pt's brother, verbalized understanding. Nothing further needed at this time.     Time Spent with Patient: 60 (07/26/20 1200)

## 2020-07-26 NOTE — Progress Notes (Signed)
Tumor Board Documentation  Kevin Baker was presented by Dr Rogue Bussing at our Tumor Board on 07/25/2020, which included representatives from medical oncology,radiation oncology,internal medicine,navigation,pathology,radiology,surgical,pharmacy,research,palliative care.  Kevin Baker currently presents as a new patient,for MDC,for new positive pathology with history of the following treatments: surgical intervention(s).  Additionally, we reviewed previous medical and familial history, history of present illness, and recent lab results along with all available histopathologic and imaging studies. The tumor board considered available treatment options and made the following recommendations: Radiation therapy (primary modality),Additional screening (NGS which is pending) Possible Chemotherapy  The following procedures/referrals were also placed: No orders of the defined types were placed in this encounter.   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  Tumor board is a meeting of clinicians from various specialty areas who evaluate and discuss patients for whom a multidisciplinary approach is being considered. Final determinations in the plan of care are those of the provider(s). The responsibility for follow up of recommendations given during tumor board is that of the provider.   Today's extended care, comprehensive team conference, [place] was not present for the discussion and was not examined.   Multidisciplinary Tumor Board is a multidisciplinary case peer review process.  Decisions discussed in the Multidisciplinary Tumor Board reflect the opinions of the specialists present at the conference without having examined the patient.  Ultimately, treatment and diagnostic decisions rest with the primary  provider(s) and the patient.

## 2020-07-26 NOTE — Progress Notes (Addendum)
Kevin Baker  Telephone:([phone] [phone]   Name: [name] Date: 07/26/2020 MRN: [id number]  DOB: [birth year]  Patient Care Team: Patient, No Pcp Per as PCP - General (General Practice)    REASON FOR CONSULTATION: Kevin Baker is a 65 y.o. 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  Patient was hospitalized 05/13/2020 to 05/14/2020 with progressive left groin pain.  He was found to have an inguinal hernia, which was reducible and did not require surgery.  CT of the chest revealed a left lower lobe mass concerning for primary bronchogenic carcinoma.  Patient also had several nodules in the left upper lobe and left posterior costophrenic sulcus.  He ultimately left AMA.  Patient was again hospitalized 07/09/2020-07/19/2020 with worsening left hip pain.  CT of the left hip revealed a metastatic lesion in the left trochanter.    Patient was evaluated by Ortho but felt not to be a surgical candidate.  He was started on XRT.  Palliative care is consulted to help address goals and manage ongoing symptoms..   SOCIAL HISTORY:     reports that he has been smoking cigarettes. He has been smoking about 3.00 packs per day. He has never used smokeless tobacco. He reports previous alcohol use. He reports previous drug use.  Patient is married but separated from his wife.  His wife currently lives in [place].  Patient has been married 11 times to 58 different women.  He has a daughter who lives in [place] and another and [place].  Patient lives at home with his brother and sister-in-law.  His sister-in-law is currently under hospice care for terminal cancer.  Patient is a veteran from the [place] [employer] and served during the [place] war.  He receives health care through the [place].  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   ADVANCE DIRECTIVES:  None on file  CODE STATUS:   PAST MEDICAL HISTORY: Past Medical History:  Diagnosis Date  . Alcohol use disorder, severe, dependence ([place])   . Mass of lower lobe of left lung     PAST SURGICAL HISTORY:  Past Surgical History:  Procedure Laterality Date  . BACK SURGERY    . LEG SURGERY Right   . PELVIC FRACTURE SURGERY    . VIDEO BRONCHOSCOPY WITH ENDOBRONCHIAL NAVIGATION Left 07/12/2020   Procedure: VIDEO BRONCHOSCOPY WITH ENDOBRONCHIAL NAVIGATION;  Surgeon: Ottie Glazier, MD;  Location: [facility] ORS;  Service: Thoracic;  Laterality: Left;    HEMATOLOGY/ONCOLOGY HISTORY:  Oncology History Overview Note  # MARCH 2022- LEFT LUNG CA with mets to left femur [s/p Bronch; Dr.Aleskerov]  # NGS/MOLECULAR TESTS:PDL-1 0%.     # PALLIATIVE CARE EVALUATION: JOSH  # PAIN MANAGEMENT:    DIAGNOSIS:   STAGE:         ;  GOALS:  CURRENT/MOST RECENT THERAPY :     # PDL-1-0   Primary cancer of left upper lobe of lung ([place])  07/17/2020 Initial Diagnosis   Primary cancer of left upper lobe of lung ([facility])     ALLERGIES:  is allergic to codeine, sulfamethoxazole-trimethoprim, lactose intolerance (gi), and no known allergies.  MEDICATIONS:  Current Outpatient Medications  Medication Sig Dispense Refill  . ondansetron (ZOFRAN) 4 MG tablet Take 1 tablet (4 mg total) by mouth  every 8 (eight) hours as needed for nausea or vomiting. 45 tablet 0  . prochlorperazine (COMPAZINE) 10 MG tablet Take 1 tablet (10 mg total) by mouth every 6 (six) hours as needed for nausea or vomiting. 45 tablet 0  . acetaminophen (TYLENOL) 325 MG tablet Take 500 mg by mouth every 6 (six) hours as needed.    . baclofen (LIORESAL) 10 MG tablet Take 10 mg by mouth 3 (three) times daily as needed for muscle spasms.    [name] buPROPion (WELLBUTRIN SR) 150 MG 12 hr tablet Take 150 mg by mouth daily.    . Cholecalciferol 50 MCG (2000 UT) TABS  Take 2,000 Units by mouth daily.    . diclofenac Sodium (VOLTAREN) 1 % GEL Apply 2-4 g topically 4 (four) times daily as needed.    . feeding supplement (ENSURE ENLIVE / ENSURE PLUS) LIQD Take 237 mLs by mouth 3 (three) times daily between meals. 05397 mL 0  . folic acid (FOLVITE) 1 MG tablet Take 1 mg by mouth daily.    [name] gabapentin (NEURONTIN) 300 MG capsule Take 300 mg by mouth 3 (three) times daily.    . hydrOXYzine (VISTARIL) 25 MG capsule Take 25 mg by mouth 4 (four) times daily as needed for anxiety.    . lidocaine (LIDODERM) 5 % Place 1 patch onto the skin See admin instructions. Apply 1 patch to the skin once a day to painful areas for 12 hours, then remove. (Patient not taking: No sig reported)    . Multiple Vitamin (MULTIVITAMIN WITH MINERALS) TABS tablet Take 1 tablet by mouth daily.    . naloxone (NARCAN) nasal spray 4 mg/0.1 mL SPRAY 1 SPRAY INTO ONE NOSTRIL AS DIRECTED FOR OPIOID OVERDOSE (TURN PERSON ON SIDE AFTER DOSE. IF NO RESPONSE IN 2-3 MINUTES OR PERSON RESPONDS BUT RELAPSES, REPEAT USING A NEW SPRAY DEVICE AND SPRAY INTO THE OTHER NOSTRIL. CALL 911 AFTER USE.) * EMERGENCY USE ONLY *    . nicotine polacrilex (COMMIT) 4 MG lozenge DISSOLVE 1 LOZENGE MOUTH EVERY HOUR AS NEEDED TO HELP STOP SMOKING ** CALL [facility] [phone] FOR INFORMATION ABOUT SUPPORT IN QUITTING.  - MAX 24 PIECES IN 24 HOURS TO HELP STOP SMOKING ** CALL [facility] [phone] FOR INFORMATION ABOUT SUPPORT IN QUITTING.  - MAX 24 PIECES IN 24 HOURS 108 tablet 1  . omeprazole (PRILOSEC) 20 MG capsule Take 1 capsule (20 mg total) by mouth daily. 30 capsule 0  . oxyCODONE (OXYCONTIN) 15 mg 12 hr tablet Take 1 tablet (15 mg total) by mouth every 8 (eight) hours. 90 tablet 0  . Oxycodone HCl 10 MG TABS Take 1 tablet (10 mg total) by mouth every 4 (four) hours as needed. 60 tablet 0  . traZODone (DESYREL) 50 MG tablet Take 50 mg by mouth at bedtime as needed.     No current facility-administered  medications for this visit.   Facility-Administered Medications Ordered in Other Visits  Medication Dose Route Frequency Provider Last Rate Last Admin  . 0.9 %  sodium chloride infusion   Intravenous Continuous Cammie Sickle, MD 999 mL/hr at 07/26/20 1059 New Bag at 07/26/20 1059    VITAL SIGNS: There were no vitals taken for this visit. There were no vitals filed for this visit.  Estimated body mass index is 19.12 kg/m as calculated from the following:   Height as of an earlier encounter on 07/26/20: 6' (1.829 m).   Weight as of an earlier encounter on 07/26/20: 141  lb (64 kg).  LABS: CBC:    Component Value Date/Time   WBC 10.0 07/26/2020 1107   HGB 8.7 (L) 07/26/2020 1107   HCT 26.6 (L) 07/26/2020 1107   PLT 453 (H) 07/26/2020 1107   MCV 100.8 (H) 07/26/2020 1107   NEUTROABS 8.0 (H) 07/26/2020 1107   LYMPHSABS 0.8 07/26/2020 1107   MONOABS 1.2 (H) 07/26/2020 1107   EOSABS 0.0 07/26/2020 1107   BASOSABS 0.1 07/26/2020 1107   Comprehensive Metabolic Panel:    Component Value Date/Time   NA 134 (L) 07/16/2020 0822   K 4.1 07/16/2020 0822   CL 95 (L) 07/16/2020 0822   CO2 31 07/16/2020 0822   BUN 15 07/16/2020 0822   CREATININE 0.50 (L) 07/16/2020 0822   GLUCOSE 111 (H) 07/16/2020 0822   CALCIUM 8.7 (L) 07/16/2020 0822   AST 19 07/09/2020 2120   ALT 16 07/09/2020 2120   ALKPHOS 66 07/09/2020 2120   BILITOT 0.5 07/09/2020 2120   PROT 7.2 07/09/2020 2120   ALBUMIN 3.4 (L) 07/09/2020 2120    RADIOGRAPHIC STUDIES: MR Lumbar Spine W Wo Contrast  Result Date: 07/11/2020 CLINICAL DATA:  65 year old male with low back and left lower extremity pain progressed x3 months. Spiculated left lower lobe lung mass in December. EXAM: MRI LUMBAR SPINE WITHOUT AND WITH CONTRAST TECHNIQUE: Multiplanar and multiecho pulse sequences of the lumbar spine were obtained without and with intravenous contrast. CONTRAST:  5m GADAVIST GADOBUTROL 1 MMOL/ML IV SOLN COMPARISON:  CT Chest,  Abdomen, and Pelvis 05/14/2020. FINDINGS: Segmentation:  Normal on the December CT. Alignment: Grade 2 spondylolisthesis of L4 on L5, and grade 1 anterolisthesis of L5 on S1 are stable since December. No associated pars fractures. Underlying mild dextroconvex lumbar scoliosis. Vertebrae: Abnormal decreased T1 signal, STIR hyperintensity and patchy enhancement in the left sacral ala partially visible on series 7, image 11. Elsewhere the visible sacrum and left SI joint appear intact. However, left SI joint ankylosis was demonstrated on the prior CT. Subtle semicircular area of increased STIR signal in the left posterolateral L2 vertebral body (series 7, image 9) demonstrates mild enhancement, and a subtle sclerotic lesion was present here in December. However, axial MRI appearance today raises the possibility of degenerative Schmorl's node (series 8, image 15). The lesion is 16 mm diameter by 8 mm thick. Otherwise normal marrow signal in the visible lower thoracic and lumbar spine. Mild chronic T11 superior endplate compression is stable since December. Conus medullaris and cauda equina: Conus extends to the T12-L1 level. No lower spinal cord or conus signal abnormality. No abnormal intradural enhancement. No dural thickening. Cauda equina nerve roots appear normal. Paraspinal and other soft tissues: Partially visible distended urinary bladder. Trace free fluid in the pelvis. Otherwise negative visible abdominal viscera. Negative lumbar paraspinal soft tissues. Disc levels: Capacious lower thoracic and lumbar spinal canal above L4. L4-L5: Grade 2 spondylolisthesis with advanced disc and posterior element degeneration. Severe facet hypertrophy greater on the right with degenerative facet joint fluid. Moderate spinal stenosis and bilateral lateral recess stenosis. Moderate to severe bilateral L4 foraminal stenosis. L5-S1: Mild anterolisthesis with disc and posterior element degeneration but no spinal stenosis. There is  moderate right L5 foraminal stenosis. IMPRESSION: 1. Partially visible abnormal signal in the left sacral ala with patchy enhancement. This does not seem mass-like, and more resembles a sacral insufficiency fracture than metastatic disease (but see also #2). 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 2. Small 5 x 16 mm enhancing lesion in the L2  posterosuperior vertebral body is indeterminate for small bone Metastasis versus Schmorl's node. Follow-up PET-CT might best characterize further. Failing MAC, short interval 2-3 month follow-up MRI would be most valuable. 3. No other metastatic disease identified in the visible lower thoracic or lumbar spine. 4. Degenerative grade 2 spondylolisthesis at L4-L5 with advanced disc and posterior element degeneration, moderate spinal, lateral recess, and moderate to severe foraminal stenosis. Grade 1 spondylolisthesis at L5-S1 with moderate right foraminal stenosis. Electronically Signed   By: Genevie Ann M.D.   On: 07/11/2020 08:10   CT Hip Left Wo Contrast  Result Date: 07/10/2020 CLINICAL DATA:  Left leg and arm weakness with pain since March 2021, negative radiographs EXAM: CT OF THE LEFT HIP WITHOUT CONTRAST TECHNIQUE: Multidetector CT imaging of the left hip was performed according to the standard protocol. Multiplanar CT image reconstructions were also generated. COMPARISON:  Radiograph 07/09/2020, CT 05/14/2020 FINDINGS: Bones/Joint/Cartilage The osseous structures appear diffusely demineralized which may limit detection of small or nondisplaced fractures. Ovoid lucent lesion centered within the lesser trochanter measuring approximately 2.6 x 2.3 x 3.1 cm in size. No clear associated pathologic fracture is evident at this time. No other suspicious lytic or blastic lesions within the included margins of imaging. No other acute bony abnormality. Specifically, no fracture, subluxation, or dislocation of the left hip or included bony pelvis. More remote remote  posttraumatic changes of the left pubic root, superior and inferior rami, unchanged from comparison CT. Ankylosis left SI joint and likely prior traumatic deformity of the left ilium. Degenerative change at the pubic symphysis and left hip is similar to comparison. No sizable hip effusion. Ligaments Suboptimally assessed by CT. Muscles and Tendons No acute musculotendinous abnormality is seen. Soft tissues Atherosclerotic calcifications in the iliac arteries and proximal common and superficial femoral arteries. Circumferential thickening of the urinary bladder though possibly related to underdistention. Fluid-filled appearance of the colon, correlate for rapid transit state. IMPRESSION: 1. Ovoid lucent lesion centered within the left lesser trochanter measuring approximately 2.6 x 2.3 x 3.1 cm in size. No clear associated pathologic fracture is evident at this time. Findings concerning for metastatic lesion given known left lung lesion. 2. Remote posttraumatic changes of the left pubic root, superior and inferior rami, and left ilium unchanged from comparison CT. 3. Ankylosis left SI joint. 4. Circumferential thickening of the urinary bladder though possibly related to underdistention. Correlate with urinalysis to exclude cystitis. 5. Fluid-filled appearance of the colon, could reflect diarrheal illness/rapid transit state. 6. Atherosclerosis These results were called by telephone at the time of interpretation on 07/10/2020 at 12:23 am to provider Dr. Tamala Julian, who verbally acknowledged these results. Electronically Signed   By: Lovena Le M.D.   On: 07/10/2020 00:23   DG C-Arm 1-60 Min-No Report  Result Date: 07/12/2020 Fluoroscopy was utilized by the requesting physician.  No radiographic interpretation.   DG Hip Unilat W or Wo Pelvis 2-3 Views Left  Result Date: 07/09/2020 CLINICAL DATA:  Acute on chronic hip pain EXAM: DG HIP (WITH OR WITHOUT PELVIS) 2-3V LEFT COMPARISON:  05/13/2020 FINDINGS: Chronic  deformity of left iliac bone and pubic rami. No acute displaced fracture or malalignment is seen. There are mild degenerative changes of both hips. Vascular calcifications. IMPRESSION: No acute osseous abnormality. Chronic deformity of left iliac bone and pubic rami. Electronically Signed   By: Donavan Foil M.D.   On: 07/09/2020 21:25   CT Super D Chest W Contrast  Result Date: 07/11/2020 CLINICAL DATA:  Left lower lobe  mass, EBUS planning EXAM: CT CHEST WITH CONTRAST TECHNIQUE: Multidetector CT imaging of the chest was performed using thin slice collimation for electromagnetic bronchoscopy planning purposes, with intravenous contrast. CONTRAST:  75m OMNIPAQUE IOHEXOL 300 MG/ML  SOLN COMPARISON:  CT chest, 05/14/2020 FINDINGS: Cardiovascular: Aortic atherosclerosis. Normal heart size. Three-vessel coronary artery calcifications and/or stents. No pericardial effusion. Mediastinum/Nodes: No enlarged mediastinal, hilar, or axillary lymph nodes. Thyroid gland, trachea, and esophagus demonstrate no significant findings. Lungs/Pleura: Slight interval enlargement of a large mass of the superior segment left lower lobe, measuring 4.9 x 4.4 cm, previously 4.6 x 3.8 cm when measured similarly (series 3, image 40). There is postobstructive consolidation and nodularity of the dependent left lower lobe (series 3, image 51). Significant interval enlargement of a spiculated appearing nodule of the anterior left upper lobe measuring 1.4 x 1.2 cm, previously 1.1 x 1.0 cm when measured similarly (series 3, image 37). Unchanged small nodule of the superior segment right lower lobe measuring 4 mm (series 3, image 27). Slight interval enlargement of a nodule of the peripheral right upper lobe measuring 3 mm, previously no greater than 1-2 mm (series 3, image 35). Minimal centrilobular emphysema. No pleural effusion or pneumothorax. Upper Abdomen: No acute abnormality. Musculoskeletal: No chest wall mass or suspicious bone lesions  identified. IMPRESSION: 1. Slight interval enlargement of a large mass of the superior segment left lower lobe, measuring 4.9 x 4.4 cm, previously 4.6 x 3.8 cm when measured similarly. There is postobstructive consolidation and nodularity of the dependent left lower lobe. Findings are consistent with primary lung malignancy. 2. Significant interval enlargement of a spiculated appearing nodule of the anterior left upper lobe measuring 1.4 x 1.2 cm, previously 1.1 x 1.0 cm when measured similarly. This is concerning for a synchronous primary versus metastasis. 3. Slight interval enlargement of a nodule of the peripheral right upper lobe measuring 3 mm, previously no greater than 1-2 mm. This is again concerning for synchronous primary or metastasis. 4. Unchanged small nodule of the superior segment right lower lobe measuring 4 mm, nonspecific. 5. No mediastinal lymphadenopathy. 6. Minimal centrilobular emphysema. 7. Coronary artery disease. Aortic Atherosclerosis (ICD10-I70.0) and Emphysema (ICD10-J43.9). Electronically Signed   By: AEddie CandleM.D.   On: 07/11/2020 14:10    PERFORMANCE STATUS (ECOG) : 3 - Symptomatic, >50% confined to bed  Review of Systems Unless otherwise noted, a complete review of systems is negative.  Physical Exam General: NAD Pulmonary: unlabored Abdomen: soft, nontender, + bowel sounds GU: no suprapubic tenderness Extremities: no edema, no joint deformities Skin: no rashes Neurological: Weakness but otherwise nonfocal  IMPRESSION: Patient seen for post hospital follow-up.  Patient acutely symptomatic this morning with nausea and pain.  He vomited in the clinic.  Patient given IV fluids, Zofran, and dexamethasone.  We will also give him morphine 2 mg IV x1.  Patient felt improved after receiving IV fluids and medications.  Patient was really unable to tell me what medications he is taking at home as his brother is completely managing all of his medications.  Patient  feels pain has been poorly controlled since discharging home from the hospital.  I called and spoke with patient's brother by phone.  We discussed increasing his OxyContin to 15 mg every 8 hours.  Brother says that patient is out of oxycodone IR despite it being dispensed 1 week ago.  Will refill but discussed appropriate administration and frequency for breakthrough pain.  I will also send Rx for as needed antiemetics.  Patient  unable to receive XRT today due to symptomatic complaints.  Will reschedule for next week.  We will plan follow-up clinic visit in 2 weeks with Dr. Rogue Bussing and me to further discuss goals of treatment.  Note that patient's brother reports that patient is still actively drinking alcohol daily.  Sobriety strongly encouraged as this may ultimately impact his treatment options and interfere with medication regimen. I spoke with patient about maintaining sobriety.  Patient has previously verbalized a desire to pursue systemic treatment for the cancer but this may be limited by his ongoing alcohol use.  There is discrepancy in his reported alcohol intake.  Patient says that he is drinking 1 beer on occasion.  His brother says that he is drinking at least a sixpack a day.  Home health was ordered but has not yet started.  PLAN: -Continue current scope of treatment -Continue Home health -Referral for home palliative care -Zofran and Compazine as needed for nausea -Strongly recommend sobriety -Increase OxyContin 15 mg every 8 hours #90 -Increase frequency oxycodone 10 mg every 4 hours as needed #60 -RTC 2 weeks  Case and plan discussed with Dr. Rogue Bussing  Patient expressed understanding and was in agreement with this plan. He also understands that He can call the clinic at any time with any questions, concerns, or complaints.     Time Total: 45 minutes  Visit consisted of counseling and education dealing with the complex and emotionally intense issues of symptom  management and palliative care in the setting of serious and potentially life-threatening illness.Greater than 50%  of this time was spent counseling and coordinating care related to the above assessment and plan.  Signed by: Altha Harm, PhD, NP-C

## 2020-07-27 ENCOUNTER — Ambulatory Visit
Admission: RE | Admit: 2020-07-27 | Discharge: 2020-07-27 | Disposition: A | Discharge status: Home / Self Care | Payer: Medicare HMO | Source: Ambulatory Visit | Attending: Internal Medicine | Admitting: Internal Medicine

## 2020-07-27 ENCOUNTER — Other Ambulatory Visit: Payer: Self-pay

## 2020-07-27 DIAGNOSIS — C3412 Malignant neoplasm of upper lobe, left bronchus or lung: Secondary | ICD-10-CM | POA: Diagnosis not present

## 2020-07-27 DIAGNOSIS — G936 Cerebral edema: Secondary | ICD-10-CM | POA: Diagnosis not present

## 2020-07-27 DIAGNOSIS — G319 Degenerative disease of nervous system, unspecified: Secondary | ICD-10-CM | POA: Diagnosis not present

## 2020-07-27 DIAGNOSIS — I639 Cerebral infarction, unspecified: Secondary | ICD-10-CM | POA: Diagnosis not present

## 2020-07-27 DIAGNOSIS — I6782 Cerebral ischemia: Secondary | ICD-10-CM | POA: Diagnosis not present

## 2020-07-27 MED ORDER — GADOBUTROL 1 MMOL/ML IV SOLN
6.0000 mL | Freq: Once | INTRAVENOUS | Status: AC | PRN
  Administered 2020-07-27: 6 mL via INTRAVENOUS

## 2020-07-29 ENCOUNTER — Encounter: Payer: Self-pay | Admitting: *Deleted

## 2020-07-29 ENCOUNTER — Telehealth: Payer: Self-pay | Admitting: Internal Medicine

## 2020-07-29 ENCOUNTER — Ambulatory Visit
Admission: RE | Admit: 2020-07-29 | Discharge: 2020-07-29 | Disposition: A | Discharge status: Home / Self Care | Payer: Medicare HMO | Source: Ambulatory Visit | Attending: Radiation Oncology | Admitting: Radiation Oncology

## 2020-07-29 DIAGNOSIS — C3412 Malignant neoplasm of upper lobe, left bronchus or lung: Secondary | ICD-10-CM | POA: Diagnosis not present

## 2020-07-29 DIAGNOSIS — R5383 Other fatigue: Secondary | ICD-10-CM | POA: Diagnosis not present

## 2020-07-29 DIAGNOSIS — C7931 Secondary malignant neoplasm of brain: Secondary | ICD-10-CM | POA: Diagnosis not present

## 2020-07-29 DIAGNOSIS — M25512 Pain in left shoulder: Secondary | ICD-10-CM | POA: Diagnosis not present

## 2020-07-29 DIAGNOSIS — K4090 Unilateral inguinal hernia, without obstruction or gangrene, not specified as recurrent: Secondary | ICD-10-CM | POA: Diagnosis not present

## 2020-07-29 DIAGNOSIS — F17210 Nicotine dependence, cigarettes, uncomplicated: Secondary | ICD-10-CM | POA: Diagnosis not present

## 2020-07-29 DIAGNOSIS — M549 Dorsalgia, unspecified: Secondary | ICD-10-CM | POA: Diagnosis not present

## 2020-07-29 DIAGNOSIS — M2550 Pain in unspecified joint: Secondary | ICD-10-CM | POA: Diagnosis not present

## 2020-07-29 DIAGNOSIS — C7951 Secondary malignant neoplasm of bone: Secondary | ICD-10-CM | POA: Diagnosis not present

## 2020-07-29 MED ORDER — DEXAMETHASONE 4 MG PO TABS: 4.0000 mg | ORAL_TABLET | Freq: Two times a day (BID) | ORAL | 0 refills | Status: DC

## 2020-07-29 MED ORDER — BACLOFEN 5 MG PO TABS: 10.0000 mg | ORAL_TABLET | Freq: Three times a day (TID) | ORAL | 0 refills | Status: DC

## 2020-07-29 NOTE — Telephone Encounter (Signed)
hayley-I spoke to patient's brother Linna Hoff; informed him of the metastatic disease to the brain.  As per his request-I refilled patient's baclofen.  I also called in a prescription of dexamethasone-for brain swelling.  Please inform the the patient's brother to make sure patient starts medication ASAP.   Follow-up with [place] as planned.

## 2020-07-30 ENCOUNTER — Other Ambulatory Visit: Payer: Medicare HMO

## 2020-07-30 ENCOUNTER — Inpatient Hospital Stay
Admission: RE | Admit: 2020-07-30 | Discharge: 2020-07-30 | Disposition: A | Discharge status: Home / Self Care | Payer: Medicare HMO | Source: Ambulatory Visit | Attending: Radiation Oncology | Admitting: Radiation Oncology

## 2020-07-30 ENCOUNTER — Ambulatory Visit
Admission: RE | Admit: 2020-07-30 | Discharge: 2020-07-30 | Disposition: A | Discharge status: Home / Self Care | Payer: Medicare HMO | Source: Ambulatory Visit | Attending: Radiation Oncology | Admitting: Radiation Oncology

## 2020-07-30 ENCOUNTER — Inpatient Hospital Stay: Payer: Medicare HMO | Admitting: Internal Medicine

## 2020-07-30 ENCOUNTER — Inpatient Hospital Stay (HOSPITAL_BASED_OUTPATIENT_CLINIC_OR_DEPARTMENT_OTHER): Payer: Medicare HMO | Admitting: Hospice and Palliative Medicine

## 2020-07-30 ENCOUNTER — Encounter: Payer: Self-pay | Admitting: Hospice and Palliative Medicine

## 2020-07-30 ENCOUNTER — Other Ambulatory Visit: Payer: Self-pay | Admitting: *Deleted

## 2020-07-30 ENCOUNTER — Inpatient Hospital Stay: Payer: Medicare HMO

## 2020-07-30 VITALS — BP 116/63 | HR 80 | Temp 96.5°F | Resp 18

## 2020-07-30 DIAGNOSIS — M25512 Pain in left shoulder: Secondary | ICD-10-CM | POA: Diagnosis not present

## 2020-07-30 DIAGNOSIS — C3490 Malignant neoplasm of unspecified part of unspecified bronchus or lung: Secondary | ICD-10-CM

## 2020-07-30 DIAGNOSIS — C3412 Malignant neoplasm of upper lobe, left bronchus or lung: Secondary | ICD-10-CM

## 2020-07-30 DIAGNOSIS — C7951 Secondary malignant neoplasm of bone: Secondary | ICD-10-CM | POA: Diagnosis not present

## 2020-07-30 DIAGNOSIS — M549 Dorsalgia, unspecified: Secondary | ICD-10-CM | POA: Diagnosis not present

## 2020-07-30 DIAGNOSIS — J439 Emphysema, unspecified: Secondary | ICD-10-CM | POA: Diagnosis not present

## 2020-07-30 DIAGNOSIS — G893 Neoplasm related pain (acute) (chronic): Secondary | ICD-10-CM

## 2020-07-30 DIAGNOSIS — C7931 Secondary malignant neoplasm of brain: Secondary | ICD-10-CM | POA: Diagnosis not present

## 2020-07-30 DIAGNOSIS — E43 Unspecified severe protein-calorie malnutrition: Secondary | ICD-10-CM | POA: Diagnosis not present

## 2020-07-30 DIAGNOSIS — K4090 Unilateral inguinal hernia, without obstruction or gangrene, not specified as recurrent: Secondary | ICD-10-CM | POA: Diagnosis not present

## 2020-07-30 DIAGNOSIS — M4316 Spondylolisthesis, lumbar region: Secondary | ICD-10-CM | POA: Diagnosis not present

## 2020-07-30 DIAGNOSIS — Z515 Encounter for palliative care: Secondary | ICD-10-CM | POA: Diagnosis not present

## 2020-07-30 DIAGNOSIS — D630 Anemia in neoplastic disease: Secondary | ICD-10-CM | POA: Diagnosis not present

## 2020-07-30 DIAGNOSIS — M5136 Other intervertebral disc degeneration, lumbar region: Secondary | ICD-10-CM | POA: Diagnosis not present

## 2020-07-30 DIAGNOSIS — F17210 Nicotine dependence, cigarettes, uncomplicated: Secondary | ICD-10-CM | POA: Diagnosis not present

## 2020-07-30 DIAGNOSIS — M2550 Pain in unspecified joint: Secondary | ICD-10-CM | POA: Diagnosis not present

## 2020-07-30 DIAGNOSIS — R5383 Other fatigue: Secondary | ICD-10-CM | POA: Diagnosis not present

## 2020-07-30 DIAGNOSIS — M1612 Unilateral primary osteoarthritis, left hip: Secondary | ICD-10-CM | POA: Diagnosis not present

## 2020-07-30 MED ORDER — DICLOFENAC SODIUM 1 % EX GEL: 2.0000 g | Freq: Four times a day (QID) | CUTANEOUS | 0 refills | Status: AC | PRN

## 2020-07-30 NOTE — Progress Notes (Signed)
[place]  [phone] [phone]   Name: Kevin Baker Date: 07/30/2020 MRN: [id number]  DOB: 01-14-1956  Patient Care Team: Patient, No Pcp Per as PCP - General (General Practice)    REASON FOR CONSULTATION: Kevin Baker is a 65 y.o. 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  Patient was hospitalized 05/13/2020 to 05/14/2020 with progressive left groin pain.  He was found to have an inguinal hernia, which was reducible and did not require surgery.  CT of the chest revealed a left lower lobe mass concerning for primary bronchogenic carcinoma.  Patient also had several nodules in the left upper lobe and left posterior costophrenic sulcus.  He ultimately left AMA.  Patient was again hospitalized 07/09/2020-07/19/2020 with worsening left hip pain.  CT of the left hip revealed a metastatic lesion in the left trochanter.    Patient was evaluated by Ortho but felt not to be a surgical candidate.  He was started on XRT.  Palliative care is consulted to help address goals and manage ongoing symptoms..   SOCIAL HISTORY:     reports that he has been smoking cigarettes. He has been smoking about 3.00 packs per day. He has never used smokeless tobacco. He reports previous alcohol use. He reports previous drug use.  Patient is married but separated from his wife.  His wife currently lives in [place].  Patient has been married 11 times to 1 different women.  He has a daughter who lives in [place] and another and [place].  Patient lives at home with his brother and sister-in-law.  His sister-in-law is currently under hospice care for terminal cancer.  Patient is a veteran from the [facility] and served during the [place] war.  He receives health care through the [place].  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   ADVANCE DIRECTIVES:  None on file  CODE STATUS:   PAST MEDICAL HISTORY: Past Medical History:  Diagnosis Date  . Alcohol use disorder, severe, dependence ([place])   . Mass of lower lobe of left lung     PAST SURGICAL HISTORY:  Past Surgical History:  Procedure Laterality Date  . BACK SURGERY    . LEG SURGERY Right   . PELVIC FRACTURE SURGERY    . VIDEO BRONCHOSCOPY WITH ENDOBRONCHIAL NAVIGATION Left 07/12/2020   Procedure: VIDEO BRONCHOSCOPY WITH ENDOBRONCHIAL NAVIGATION;  Surgeon: Ottie Glazier, MD;  Location: [facility] ORS;  Service: Thoracic;  Laterality: Left;    HEMATOLOGY/ONCOLOGY HISTORY:  Oncology History Overview Note  # MARCH 2022- LEFT LUNG CA with mets to left femur [s/p Bronch; Dr.Aleskerov]; s/p RT to left femur   MARCH 11th- MRI Brain mets  # NGS/MOLECULAR TESTS:PDL-1 = 0%.     # PALLIATIVE CARE EVALUATION: JOSH  # PAIN MANAGEMENT:    DIAGNOSIS: Left lung cancer/  STAGE:  IV       ;  GOALS: Palliative  CURRENT/MOST RECENT THERAPY : RT     Primary cancer of left upper lobe of lung ([place])  07/17/2020 Initial Diagnosis   Primary cancer of left upper lobe of lung ([place])   07/29/2020 Cancer Staging   Staging form: Lung, AJCC 8th Edition - Clinical: Stage IVB (cT2, cN0, pM1c) - Signed by Cammie Sickle, MD on 07/29/2020 Stage prefix:  Initial diagnosis     ALLERGIES:  is allergic to codeine, sulfamethoxazole-trimethoprim, lactose intolerance (gi), and no known allergies.  MEDICATIONS:  Current Outpatient Medications  Medication Sig Dispense Refill  . acetaminophen (TYLENOL) 325 MG tablet Take 500 mg by mouth every 6 (six) hours as needed.    . baclofen 5 MG TABS Take 10 mg by mouth 3 (three) times daily. 60 tablet 0  . buPROPion (WELLBUTRIN SR) 150 MG 12 hr tablet Take 150 mg by mouth daily.    . Cholecalciferol 50 MCG (2000 UT) TABS Take 2,000 Units by mouth daily.    [name] dexamethasone  (DECADRON) 4 MG tablet Take 1 tablet (4 mg total) by mouth 2 (two) times daily. 30 tablet 0  . feeding supplement (ENSURE ENLIVE / ENSURE PLUS) LIQD Take 237 mLs by mouth 3 (three) times daily between meals. 70488 mL 0  . folic acid (FOLVITE) 1 MG tablet Take 1 mg by mouth daily.    [name] gabapentin (NEURONTIN) 300 MG capsule Take 300 mg by mouth 3 (three) times daily.    . hydrOXYzine (VISTARIL) 25 MG capsule Take 25 mg by mouth 4 (four) times daily as needed for anxiety.    . Multiple Vitamin (MULTIVITAMIN WITH MINERALS) TABS tablet Take 1 tablet by mouth daily.    . naloxone (NARCAN) nasal spray 4 mg/0.1 mL SPRAY 1 SPRAY INTO ONE NOSTRIL AS DIRECTED FOR OPIOID OVERDOSE (TURN PERSON ON SIDE AFTER DOSE. IF NO RESPONSE IN 2-3 MINUTES OR PERSON RESPONDS BUT RELAPSES, REPEAT USING A NEW SPRAY DEVICE AND SPRAY INTO THE OTHER NOSTRIL. CALL 911 AFTER USE.) * EMERGENCY USE ONLY *    . nicotine polacrilex (COMMIT) 4 MG lozenge DISSOLVE 1 LOZENGE MOUTH EVERY HOUR AS NEEDED TO HELP STOP SMOKING ** CALL [facility] [phone] FOR INFORMATION ABOUT SUPPORT IN QUITTING.  - MAX 24 PIECES IN 24 HOURS TO HELP STOP SMOKING ** CALL [facility] [phone] FOR INFORMATION ABOUT SUPPORT IN QUITTING.  - MAX 24 PIECES IN 24 HOURS 108 tablet 1  . omeprazole (PRILOSEC) 20 MG capsule Take 1 capsule (20 mg total) by mouth daily. 30 capsule 0  . ondansetron (ZOFRAN) 4 MG tablet Take 1 tablet (4 mg total) by mouth every 8 (eight) hours as needed for nausea or vomiting. 45 tablet 0  . oxyCODONE (OXYCONTIN) 15 mg 12 hr tablet Take 1 tablet (15 mg total) by mouth every 8 (eight) hours. 90 tablet 0  . Oxycodone HCl 10 MG TABS Take 1 tablet (10 mg total) by mouth every 4 (four) hours as needed. 60 tablet 0  . prochlorperazine (COMPAZINE) 10 MG tablet Take 1 tablet (10 mg total) by mouth every 6 (six) hours as needed for nausea or vomiting. 45 tablet 0  . diclofenac Sodium (VOLTAREN) 1 % GEL Apply 2-4 g topically 4 (four)  times daily as needed. 50 g 0  . traZODone (DESYREL) 50 MG tablet Take 50 mg by mouth at bedtime as needed. (Patient not taking: Reported on 07/30/2020)     No current facility-administered medications for this visit.    VITAL SIGNS: BP 116/63 (BP Location: Left Arm, Patient Position: Sitting)   Pulse 80   Temp (!) 96.5 F (35.8 C) (Tympanic)   Resp 18  There were no vitals filed for this visit.  Estimated body mass index is 19.12 kg/m as calculated from the following:   Height as of 07/26/20: 6' (1.829 m).   Weight as of 07/26/20: 141 lb (64 kg).  LABS: CBC:    Component Value Date/Time   WBC 10.0 07/26/2020 1107   HGB 8.7 (L) 07/26/2020 1107   HCT 26.6 (L) 07/26/2020 1107   PLT 453 (H) 07/26/2020 1107   MCV 100.8 (H) 07/26/2020 1107   NEUTROABS 8.0 (H) 07/26/2020 1107   LYMPHSABS 0.8 07/26/2020 1107   MONOABS 1.2 (H) 07/26/2020 1107   EOSABS 0.0 07/26/2020 1107   BASOSABS 0.1 07/26/2020 1107   Comprehensive Metabolic Panel:    Component Value Date/Time   NA 137 07/26/2020 1107   K 4.2 07/26/2020 1107   CL 104 07/26/2020 1107   CO2 24 07/26/2020 1107   BUN 17 07/26/2020 1107   CREATININE 0.79 07/26/2020 1107   GLUCOSE 89 07/26/2020 1107   CALCIUM 9.1 07/26/2020 1107   AST 14 (L) 07/26/2020 1107   ALT 26 07/26/2020 1107   ALKPHOS 64 07/26/2020 1107   BILITOT 0.9 07/26/2020 1107   PROT 6.8 07/26/2020 1107   ALBUMIN 3.3 (L) 07/26/2020 1107    RADIOGRAPHIC STUDIES: MR Brain W Wo Contrast  Result Date: 07/27/2020 CLINICAL DATA:  Non-small cell lung cancer staging. Left lower extremity weakness. EXAM: MRI HEAD WITHOUT AND WITH CONTRAST TECHNIQUE: Multiplanar, multiecho pulse sequences of the brain and surrounding structures were obtained without and with intravenous contrast. CONTRAST:  41m GADAVIST GADOBUTROL 1 MMOL/ML IV SOLN COMPARISON:  None. FINDINGS: Brain: No acute infarct, intracranial hemorrhage, midline shift, or extra-axial fluid collection is identified.  Small chronic infarcts are noted in the cerebellum bilaterally, and there are small chronic cortical infarcts in the superior aspects of both occipital lobes and right frontal lobe. T2 hyperintensities in the cerebral white matter bilaterally are nonspecific but compatible with mild-to-moderate chronic small vessel ischemic disease. There is moderate generalized cerebral atrophy. There is an 8 mm enhancing lesion in the medial aspect of the left cerebellar hemisphere with mild to moderate surrounding edema (series 18, image 45). There is no significant posterior fossa mass effect. There is a 7 mm enhancing lesion anteriorly in the left frontal lobe without edema (series 18, image 85). There is a 4 mm focus of enhancement in the anterior right temporal lobe on axial imaging (series 18, image 57) without edema. This is not visible on coronal or sagittal imaging, potentially due to slice selection, and this may reflect a metastasis or be vascular. There is thin diffuse dural enhancement over both cerebral convexities without nodularity. Vascular: Major intracranial vascular flow voids are preserved. Skull and upper cervical spine: Unremarkable bone marrow signal. Sinuses/Orbits: Unremarkable orbits. Mild mucosal thickening in the maxillary sinuses. Clear mastoid air cells. Other: None. IMPRESSION: 1. Small enhancing lesions in the cerebellum and left frontal lobe consistent with metastases. Mild-to-moderate edema surrounding the left cerebellar metastasis without significant mass effect. 2. 4 mm focus of enhancement in the anterior right temporal lobe which may reflect a third metastasis or be vascular. 3. Mild nonspecific smooth dural enhancement over both cerebral convexities. No nodularity to strongly suggest dural metastatic disease. 4. Mild-to-moderate chronic small vessel ischemic disease and moderate cerebral atrophy with multiple chronic infarcts. 5. Electronically Signed   By: ALogan BoresM.D.   On:  07/27/2020 13:21   MR Lumbar Spine W Wo Contrast  Result Date: 07/11/2020 CLINICAL DATA:  65year old male with low back and left lower extremity pain progressed x3 months. Spiculated left lower lobe lung mass in December. EXAM: MRI LUMBAR SPINE WITHOUT AND WITH CONTRAST TECHNIQUE: Multiplanar and multiecho pulse sequences of the lumbar spine were obtained without  and with intravenous contrast. CONTRAST:  60m GADAVIST GADOBUTROL 1 MMOL/ML IV SOLN COMPARISON:  CT Chest, Abdomen, and Pelvis 05/14/2020. FINDINGS: Segmentation:  Normal on the December CT. Alignment: Grade 2 spondylolisthesis of L4 on L5, and grade 1 anterolisthesis of L5 on S1 are stable since December. No associated pars fractures. Underlying mild dextroconvex lumbar scoliosis. Vertebrae: Abnormal decreased T1 signal, STIR hyperintensity and patchy enhancement in the left sacral ala partially visible on series 7, image 11. Elsewhere the visible sacrum and left SI joint appear intact. However, left SI joint ankylosis was demonstrated on the prior CT. Subtle semicircular area of increased STIR signal in the left posterolateral L2 vertebral body (series 7, image 9) demonstrates mild enhancement, and a subtle sclerotic lesion was present here in December. However, axial MRI appearance today raises the possibility of degenerative Schmorl's node (series 8, image 15). The lesion is 16 mm diameter by 8 mm thick. Otherwise normal marrow signal in the visible lower thoracic and lumbar spine. Mild chronic T11 superior endplate compression is stable since December. Conus medullaris and cauda equina: Conus extends to the T12-L1 level. No lower spinal cord or conus signal abnormality. No abnormal intradural enhancement. No dural thickening. Cauda equina nerve roots appear normal. Paraspinal and other soft tissues: Partially visible distended urinary bladder. Trace free fluid in the pelvis. Otherwise negative visible abdominal viscera. Negative lumbar paraspinal  soft tissues. Disc levels: Capacious lower thoracic and lumbar spinal canal above L4. L4-L5: Grade 2 spondylolisthesis with advanced disc and posterior element degeneration. Severe facet hypertrophy greater on the right with degenerative facet joint fluid. Moderate spinal stenosis and bilateral lateral recess stenosis. Moderate to severe bilateral L4 foraminal stenosis. L5-S1: Mild anterolisthesis with disc and posterior element degeneration but no spinal stenosis. There is moderate right L5 foraminal stenosis. IMPRESSION: 1. Partially visible abnormal signal in the left sacral ala with patchy enhancement. This does not seem mass-like, and more resembles a sacral insufficiency fracture than metastatic disease (but see also #2). 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 2. Small 5 x 16 mm enhancing lesion in the L2 posterosuperior vertebral body is indeterminate for small bone Metastasis versus Schmorl's node. Follow-up PET-CT might best characterize further. Failing MAC, short interval 2-3 month follow-up MRI would be most valuable. 3. No other metastatic disease identified in the visible lower thoracic or lumbar spine. 4. Degenerative grade 2 spondylolisthesis at L4-L5 with advanced disc and posterior element degeneration, moderate spinal, lateral recess, and moderate to severe foraminal stenosis. Grade 1 spondylolisthesis at L5-S1 with moderate right foraminal stenosis. Electronically Signed   By: HGenevie AnnM.D.   On: 07/11/2020 08:10   CT Hip Left Wo Contrast  Result Date: 07/10/2020 CLINICAL DATA:  Left leg and arm weakness with pain since March 2021, negative radiographs EXAM: CT OF THE LEFT HIP WITHOUT CONTRAST TECHNIQUE: Multidetector CT imaging of the left hip was performed according to the standard protocol. Multiplanar CT image reconstructions were also generated. COMPARISON:  Radiograph 07/09/2020, CT 05/14/2020 FINDINGS: Bones/Joint/Cartilage The osseous structures appear diffusely  demineralized which may limit detection of small or nondisplaced fractures. Ovoid lucent lesion centered within the lesser trochanter measuring approximately 2.6 x 2.3 x 3.1 cm in size. No clear associated pathologic fracture is evident at this time. No other suspicious lytic or blastic lesions within the included margins of imaging. No other acute bony abnormality. Specifically, no fracture, subluxation, or dislocation of the left hip or included bony pelvis. More remote remote posttraumatic changes of the left  pubic root, superior and inferior rami, unchanged from comparison CT. Ankylosis left SI joint and likely prior traumatic deformity of the left ilium. Degenerative change at the pubic symphysis and left hip is similar to comparison. No sizable hip effusion. Ligaments Suboptimally assessed by CT. Muscles and Tendons No acute musculotendinous abnormality is seen. Soft tissues Atherosclerotic calcifications in the iliac arteries and proximal common and superficial femoral arteries. Circumferential thickening of the urinary bladder though possibly related to underdistention. Fluid-filled appearance of the colon, correlate for rapid transit state. IMPRESSION: 1. Ovoid lucent lesion centered within the left lesser trochanter measuring approximately 2.6 x 2.3 x 3.1 cm in size. No clear associated pathologic fracture is evident at this time. Findings concerning for metastatic lesion given known left lung lesion. 2. Remote posttraumatic changes of the left pubic root, superior and inferior rami, and left ilium unchanged from comparison CT. 3. Ankylosis left SI joint. 4. Circumferential thickening of the urinary bladder though possibly related to underdistention. Correlate with urinalysis to exclude cystitis. 5. Fluid-filled appearance of the colon, could reflect diarrheal illness/rapid transit state. 6. Atherosclerosis These results were called by telephone at the time of interpretation on 07/10/2020 at 12:23 am to  provider Dr. Tamala Julian, who verbally acknowledged these results. Electronically Signed   By: Lovena Le M.D.   On: 07/10/2020 00:23   DG C-Arm 1-60 Min-No Report  Result Date: 07/12/2020 Fluoroscopy was utilized by the requesting physician.  No radiographic interpretation.   DG Hip Unilat W or Wo Pelvis 2-3 Views Left  Result Date: 07/09/2020 CLINICAL DATA:  Acute on chronic hip pain EXAM: DG HIP (WITH OR WITHOUT PELVIS) 2-3V LEFT COMPARISON:  05/13/2020 FINDINGS: Chronic deformity of left iliac bone and pubic rami. No acute displaced fracture or malalignment is seen. There are mild degenerative changes of both hips. Vascular calcifications. IMPRESSION: No acute osseous abnormality. Chronic deformity of left iliac bone and pubic rami. Electronically Signed   By: Donavan Foil M.D.   On: 07/09/2020 21:25   CT Super D Chest W Contrast  Result Date: 07/11/2020 CLINICAL DATA:  Left lower lobe mass, EBUS planning EXAM: CT CHEST WITH CONTRAST TECHNIQUE: Multidetector CT imaging of the chest was performed using thin slice collimation for electromagnetic bronchoscopy planning purposes, with intravenous contrast. CONTRAST:  82m OMNIPAQUE IOHEXOL 300 MG/ML  SOLN COMPARISON:  CT chest, 05/14/2020 FINDINGS: Cardiovascular: Aortic atherosclerosis. Normal heart size. Three-vessel coronary artery calcifications and/or stents. No pericardial effusion. Mediastinum/Nodes: No enlarged mediastinal, hilar, or axillary lymph nodes. Thyroid gland, trachea, and esophagus demonstrate no significant findings. Lungs/Pleura: Slight interval enlargement of a large mass of the superior segment left lower lobe, measuring 4.9 x 4.4 cm, previously 4.6 x 3.8 cm when measured similarly (series 3, image 40). There is postobstructive consolidation and nodularity of the dependent left lower lobe (series 3, image 51). Significant interval enlargement of a spiculated appearing nodule of the anterior left upper lobe measuring 1.4 x 1.2 cm,  previously 1.1 x 1.0 cm when measured similarly (series 3, image 37). Unchanged small nodule of the superior segment right lower lobe measuring 4 mm (series 3, image 27). Slight interval enlargement of a nodule of the peripheral right upper lobe measuring 3 mm, previously no greater than 1-2 mm (series 3, image 35). Minimal centrilobular emphysema. No pleural effusion or pneumothorax. Upper Abdomen: No acute abnormality. Musculoskeletal: No chest wall mass or suspicious bone lesions identified. IMPRESSION: 1. Slight interval enlargement of a large mass of the superior segment left lower lobe, measuring 4.9  x 4.4 cm, previously 4.6 x 3.8 cm when measured similarly. There is postobstructive consolidation and nodularity of the dependent left lower lobe. Findings are consistent with primary lung malignancy. 2. Significant interval enlargement of a spiculated appearing nodule of the anterior left upper lobe measuring 1.4 x 1.2 cm, previously 1.1 x 1.0 cm when measured similarly. This is concerning for a synchronous primary versus metastasis. 3. Slight interval enlargement of a nodule of the peripheral right upper lobe measuring 3 mm, previously no greater than 1-2 mm. This is again concerning for synchronous primary or metastasis. 4. Unchanged small nodule of the superior segment right lower lobe measuring 4 mm, nonspecific. 5. No mediastinal lymphadenopathy. 6. Minimal centrilobular emphysema. 7. Coronary artery disease. Aortic Atherosclerosis (ICD10-I70.0) and Emphysema (ICD10-J43.9). Electronically Signed   By: Eddie Candle M.D.   On: 07/11/2020 14:10    PERFORMANCE STATUS (ECOG) : 3 - Symptomatic, >50% confined to bed  Review of Systems Unless otherwise noted, a complete review of systems is negative.  Physical Exam General: NAD Pulmonary: unlabored Abdomen: soft, nontender, + bowel sounds GU: no suprapubic tenderness Extremities: no edema, no joint deformities Skin: no rashes Neurological: Weakness  but otherwise nonfocal  IMPRESSION: Patient with an acute add-on to my clinic schedule today at his request.  Patient would like to pursue an order for a hospital bed at home.  He says that his bed in the hospital significantly facilitated his transferring and assisted with positioning to alleviate his pain.  Will order.  Patient is also having difficulty ambulating to the bathroom at night and having episodes of incontinence while trying to ambulate.  Will order bedside commode.  Patient requested refills of his oxycodone and OxyContin but these were just refilled on 3/11.  He requested Voltaren cream, which was used in the hospital.  Okay to refill.  He requested a medication for insomnia.  However, he has trazodone at home and says it works but is not currently using it.      Durable Medical Equipment  (From admission, onward)         Start     Ordered   07/30/20 0000  For home use only DME Hospital bed       Question Answer Comment  Length of Need Lifetime   Patient has (list medical condition): lung cancer with bone mets   The above medical condition requires: Patient requires the ability to reposition frequently   Bed type Semi-electric   Hoyer Lift Yes   Trapeze Bar Yes   Support Surface: Gel Overlay      07/30/20 1142   07/30/20 0000  DME Bedside commode       Question:  Patient needs a bedside commode to treat with the following condition  Answer:  Weakness   07/30/20 1142          PLAN: -Continue current scope of treatment -Continue Home health -DME: hospital bed and BSC -Continue OxyContin/oxycodone IR -RTC on 3/21   Patient expressed understanding and was in agreement with this plan. He also understands that He can call the clinic at any time with any questions, concerns, or complaints.     Time Total: 45 minutes  Visit consisted of counseling and education dealing with the complex and emotionally intense issues of symptom management and palliative  care in the setting of serious and potentially life-threatening illness.Greater than 50%  of this time was spent counseling and coordinating care related to the above assessment and plan.  Signed by:  Altha Harm, PhD, NP-C

## 2020-07-30 NOTE — Progress Notes (Signed)
    Durable Medical Equipment  (From admission, onward)         Start     Ordered   07/30/20 0000  For home use only DME Hospital bed       Question Answer Comment  Length of Need Lifetime   Patient has (list medical condition): lung cancer with bone mets   The above medical condition requires: Patient requires the ability to reposition frequently   Bed type Semi-electric   [facility] Lift Yes   Trapeze Bar Yes   Support Surface: Gel Overlay      07/30/20 1142   07/30/20 0000  DME Bedside commode       Question:  Patient needs a bedside commode to treat with the following condition  Answer:  Weakness   07/30/20 1142

## 2020-07-30 NOTE — Progress Notes (Signed)
Pt complains of persistent pain in left hip/groin area. States wakes up in the middle of night in pain and unable to get pain medication since his brother is sleeping. Requests assistance with getting hospital bed and something to help him sleep at night as well.

## 2020-07-30 NOTE — Progress Notes (Signed)
Radiation Oncology Follow up Note  Name: Kevin Baker   Date:   07/30/2020 MRN:  [id number] DOB: Oct 14, 1955    This 65 y.o. 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 provider found  HPI: Patient is a 65 year old male with multiple medical histories including history of alcohol use hepatitis C.  He is currently was undergoing treatment in our department for metastatic disease to his left femur.  He also has a CT scan showing a left lower lobe mass concerning for primary bronchogenic carcinoma.  He has had palliative radiation therapy which is ease the pain in his left hip considerably..  Biopsy of his lung was positive for non-small cell carcinoma favoring poorly differentiated adenocarcinoma.  He recently had an MRI of his brain showing small enhancing lesions in the cerebellum and left frontal lobe consistent with metastatic disease.  He also to 4 mm focus of enhancement in the right temporal lobe also reflecting possible metastatic disease.  He is now referred to [facility] for consideration of palliative radiation therapy to his brain.  He is having no focal neurologic deficits.  He states his visual fields are poor although this is been prior to recent history..  COMPLICATIONS OF TREATMENT: none  FOLLOW UP COMPLIANCE: keeps appointments   PHYSICAL EXAM:  There were no vitals taken for this visit. Motor or sensory and DTR levels are equal symmetric in upper lower extremities with some guarding on range of motion of the left lower extremity based on the lesser trochanter metastatic disease.  Crude visual fields within normal range proprioception is intact.  Well-developed well-nourished patient in NAD. HEENT reveals PERLA, EOMI, discs not visualized.  Oral cavity is clear. No oral mucosal lesions are  identified. Neck is clear without evidence of cervical or supraclavicular adenopathy. Lungs are clear to A&P. Cardiac examination is essentially unremarkable with regular rate and rhythm without murmur rub or thrill. Abdomen is benign with no organomegaly or masses noted. Motor sensory and DTR levels are equal and symmetric in the upper and lower extremities. Cranial nerves II through XII are grossly intact. Proprioception is intact. No peripheral adenopathy or edema is identified. No motor or sensory levels are noted. Crude visual fields are within normal range.  RADIOLOGY RESULTS: MRI of brain reviewed compatible with above-stated findings  PLAN: Present time electrolyte with palliative radiation therapy to his whole brain.  Would plan on delivering 30 Gray in 10 fractions.  Risks and benefits of treatment including hair loss fatigue skin reaction possible cognitive decline all were discussed in detail with the patient.  He comprehends my recommendations well.  We have simulated him today and will start treatments as soon as possible.  Patient comprehends my recommendations well.  I would like to take this opportunity to thank you for allowing me to participate in the care of your patient.Noreene Filbert, MD

## 2020-07-30 NOTE — Progress Notes (Signed)
Radiation Oncology Follow up Note old patient new area of brain metastasis  Name: Kevin Baker     Date:   07/30/2020 MRN:  [id number] DOB: Dec 22, 1955               This 65 y.o. 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 provider found  HPI: Patient is a 65 year old male with multiple medical histories including history of alcohol use hepatitis C.  He is currently was undergoing treatment in our department for metastatic disease to his left femur.  He also has a CT scan showing a left lower lobe mass concerning for primary bronchogenic carcinoma.  He has had palliative radiation therapy which is ease the pain in his left hip considerably..  Biopsy of his lung was positive for non-small cell carcinoma favoring poorly differentiated adenocarcinoma.  He recently had an MRI of his brain showing small enhancing lesions in the cerebellum and left frontal lobe consistent with metastatic disease.  He also to 4 mm focus of enhancement in the right temporal lobe also reflecting possible metastatic disease.  He is now referred to [facility] for consideration of palliative radiation therapy to his brain.  He is having no focal neurologic deficits.  He states his visual fields are poor although this is been prior to recent history..  COMPLICATIONS OF TREATMENT: none  FOLLOW UP COMPLIANCE: keeps appointments   PHYSICAL EXAM:  There were no vitals taken for this visit. Motor or sensory and DTR levels are equal symmetric in upper lower extremities with some guarding on range of motion of the left lower extremity based on the lesser trochanter metastatic disease.  Crude visual fields within normal range proprioception is intact.  Well-developed well-nourished patient in NAD. HEENT reveals PERLA, EOMI, discs not  visualized.  Oral cavity is clear. No oral mucosal lesions are identified. Neck is clear without evidence of cervical or supraclavicular adenopathy. Lungs are clear to A&P. Cardiac examination is essentially unremarkable with regular rate and rhythm without murmur rub or thrill. Abdomen is benign with no organomegaly or masses noted. Motor sensory and DTR levels are equal and symmetric in the upper and lower extremities. Cranial nerves II through XII are grossly intact. Proprioception is intact. No peripheral adenopathy or edema is identified. No motor or sensory levels are noted. Crude visual fields are within normal range.  RADIOLOGY RESULTS: MRI of brain reviewed compatible with above-stated findings  PLAN: Present time electrolyte with palliative radiation therapy to his whole brain.  Would plan on delivering 30 Gray in 10 fractions.  Risks and benefits of treatment including hair loss fatigue skin reaction possible cognitive decline all were discussed in detail with the patient.  He comprehends my recommendations well.  We have simulated him today and will start treatments as soon as possible.  Patient comprehends my recommendations well.  I would like to take this opportunity to thank you for allowing me to participate in the care of your patient.Noreene Filbert, MD          Electronically signed by Noreene Filbert, MD at 07/30/2020 1:59 PM

## 2020-07-30 NOTE — Telephone Encounter (Signed)
Spoke with pt's brother, Linna Hoff, to inform that pt needs to start decadron today prior to brain radiation. Dan verbalized understanding.

## 2020-07-30 NOTE — Progress Notes (Signed)
  Oncology Nurse Navigator Documentation  Navigator Location: [facility] (07/30/20 0900)   )Navigator Encounter Type: Lobby (07/30/20 0900)                     Patient Visit Type: RadOnc (07/30/20 0900) Treatment Phase: Active Tx (07/30/20 0900) Barriers/Navigation Needs: Coordination of Care (07/30/20 0900)   Interventions: Coordination of Care (07/30/20 0900)   Coordination of Care: Appts (07/30/20 0900)       Met with patient before and after radiation treatment today to inform of brain MRI results per Dr. Rogue Bussing. Pt given copy of MRI report and reviewed upcoming appts for CT simulation on 3/15 to prepare for whole brain radiation. Pt voiced many concerns and asked if could see Josh to discuss after his simulation. Informed pt that will inform Josh and add to his schedule if needed. Pt verbalized understanding. Will follow up with patient on 3/15.            Time Spent with Patient: 30 (07/30/20 0900)

## 2020-07-31 ENCOUNTER — Telehealth: Payer: Self-pay | Admitting: *Deleted

## 2020-07-31 ENCOUNTER — Ambulatory Visit
Admission: RE | Admit: 2020-07-31 | Discharge: 2020-07-31 | Disposition: A | Discharge status: Home / Self Care | Payer: Medicare HMO | Source: Ambulatory Visit | Attending: Radiation Oncology | Admitting: Radiation Oncology

## 2020-07-31 ENCOUNTER — Inpatient Hospital Stay: Payer: Medicare HMO

## 2020-07-31 DIAGNOSIS — E43 Unspecified severe protein-calorie malnutrition: Secondary | ICD-10-CM | POA: Diagnosis not present

## 2020-07-31 DIAGNOSIS — M549 Dorsalgia, unspecified: Secondary | ICD-10-CM | POA: Diagnosis not present

## 2020-07-31 DIAGNOSIS — C7931 Secondary malignant neoplasm of brain: Secondary | ICD-10-CM | POA: Diagnosis not present

## 2020-07-31 DIAGNOSIS — D630 Anemia in neoplastic disease: Secondary | ICD-10-CM | POA: Diagnosis not present

## 2020-07-31 DIAGNOSIS — J439 Emphysema, unspecified: Secondary | ICD-10-CM | POA: Diagnosis not present

## 2020-07-31 DIAGNOSIS — M2550 Pain in unspecified joint: Secondary | ICD-10-CM | POA: Diagnosis not present

## 2020-07-31 DIAGNOSIS — R5383 Other fatigue: Secondary | ICD-10-CM | POA: Diagnosis not present

## 2020-07-31 DIAGNOSIS — C7951 Secondary malignant neoplasm of bone: Secondary | ICD-10-CM | POA: Diagnosis not present

## 2020-07-31 DIAGNOSIS — M4316 Spondylolisthesis, lumbar region: Secondary | ICD-10-CM | POA: Diagnosis not present

## 2020-07-31 DIAGNOSIS — M25512 Pain in left shoulder: Secondary | ICD-10-CM | POA: Diagnosis not present

## 2020-07-31 DIAGNOSIS — M1612 Unilateral primary osteoarthritis, left hip: Secondary | ICD-10-CM | POA: Diagnosis not present

## 2020-07-31 DIAGNOSIS — C3412 Malignant neoplasm of upper lobe, left bronchus or lung: Secondary | ICD-10-CM | POA: Diagnosis not present

## 2020-07-31 DIAGNOSIS — G893 Neoplasm related pain (acute) (chronic): Secondary | ICD-10-CM | POA: Diagnosis not present

## 2020-07-31 DIAGNOSIS — M5136 Other intervertebral disc degeneration, lumbar region: Secondary | ICD-10-CM | POA: Diagnosis not present

## 2020-07-31 DIAGNOSIS — F17210 Nicotine dependence, cigarettes, uncomplicated: Secondary | ICD-10-CM | POA: Diagnosis not present

## 2020-07-31 DIAGNOSIS — K4090 Unilateral inguinal hernia, without obstruction or gangrene, not specified as recurrent: Secondary | ICD-10-CM | POA: Diagnosis not present

## 2020-07-31 NOTE — Telephone Encounter (Signed)
Call from Amy at [facility] asking for approval for orders for SN 1 wk 2, 1 wk break, then 1 wk 1. Also wanted to report that patient having orthostatic hypotension when he stands from sitting

## 2020-07-31 NOTE — Telephone Encounter (Signed)
Kevin Baker- ok to approve SN for patient with [facility]

## 2020-07-31 NOTE — Telephone Encounter (Signed)
Call returned to [facility] for services approval given

## 2020-08-01 ENCOUNTER — Inpatient Hospital Stay: Payer: Medicare HMO

## 2020-08-01 ENCOUNTER — Ambulatory Visit
Admission: RE | Admit: 2020-08-01 | Discharge: 2020-08-01 | Disposition: A | Discharge status: Home / Self Care | Payer: Medicare HMO | Source: Ambulatory Visit | Attending: Radiation Oncology | Admitting: Radiation Oncology

## 2020-08-01 ENCOUNTER — Telehealth: Payer: Self-pay | Admitting: Nurse Practitioner

## 2020-08-01 ENCOUNTER — Other Ambulatory Visit: Payer: Self-pay | Admitting: *Deleted

## 2020-08-01 ENCOUNTER — Other Ambulatory Visit: Payer: Self-pay

## 2020-08-01 DIAGNOSIS — C7931 Secondary malignant neoplasm of brain: Secondary | ICD-10-CM | POA: Diagnosis not present

## 2020-08-01 DIAGNOSIS — C7951 Secondary malignant neoplasm of bone: Secondary | ICD-10-CM | POA: Diagnosis not present

## 2020-08-01 DIAGNOSIS — R5383 Other fatigue: Secondary | ICD-10-CM | POA: Diagnosis not present

## 2020-08-01 DIAGNOSIS — M549 Dorsalgia, unspecified: Secondary | ICD-10-CM | POA: Diagnosis not present

## 2020-08-01 DIAGNOSIS — C3412 Malignant neoplasm of upper lobe, left bronchus or lung: Secondary | ICD-10-CM | POA: Diagnosis not present

## 2020-08-01 DIAGNOSIS — M2550 Pain in unspecified joint: Secondary | ICD-10-CM | POA: Diagnosis not present

## 2020-08-01 DIAGNOSIS — M25512 Pain in left shoulder: Secondary | ICD-10-CM | POA: Diagnosis not present

## 2020-08-01 DIAGNOSIS — K4090 Unilateral inguinal hernia, without obstruction or gangrene, not specified as recurrent: Secondary | ICD-10-CM | POA: Diagnosis not present

## 2020-08-01 DIAGNOSIS — F17210 Nicotine dependence, cigarettes, uncomplicated: Secondary | ICD-10-CM | POA: Diagnosis not present

## 2020-08-01 NOTE — Telephone Encounter (Signed)
Tried to contact patient again to schedule a Palliative Consult  and rec'd the same message as before, "The person you are trying to reach is not able to receive calls at this time".   I then called patient's brother Linna Hoff, (I saw a note in Epic by [employer] that the patient was living with him) and spoke with brother and he verified that the patient was living with him and that the patient was currently at [facility] getting radiation treatment.  He requested that I call back in a couple of hours.

## 2020-08-02 ENCOUNTER — Telehealth: Payer: Self-pay | Admitting: *Deleted

## 2020-08-02 ENCOUNTER — Encounter: Payer: Self-pay | Admitting: Internal Medicine

## 2020-08-02 ENCOUNTER — Telehealth: Payer: Self-pay | Admitting: Nurse Practitioner

## 2020-08-02 ENCOUNTER — Ambulatory Visit
Admission: RE | Admit: 2020-08-02 | Discharge: 2020-08-02 | Disposition: A | Discharge status: Home / Self Care | Payer: Medicare HMO | Source: Ambulatory Visit | Attending: Radiation Oncology | Admitting: Radiation Oncology

## 2020-08-02 ENCOUNTER — Inpatient Hospital Stay: Payer: Medicare HMO

## 2020-08-02 DIAGNOSIS — F17210 Nicotine dependence, cigarettes, uncomplicated: Secondary | ICD-10-CM | POA: Diagnosis not present

## 2020-08-02 DIAGNOSIS — C7951 Secondary malignant neoplasm of bone: Secondary | ICD-10-CM | POA: Diagnosis not present

## 2020-08-02 DIAGNOSIS — M5136 Other intervertebral disc degeneration, lumbar region: Secondary | ICD-10-CM | POA: Diagnosis not present

## 2020-08-02 DIAGNOSIS — D630 Anemia in neoplastic disease: Secondary | ICD-10-CM | POA: Diagnosis not present

## 2020-08-02 DIAGNOSIS — R5383 Other fatigue: Secondary | ICD-10-CM | POA: Diagnosis not present

## 2020-08-02 DIAGNOSIS — C7931 Secondary malignant neoplasm of brain: Secondary | ICD-10-CM | POA: Diagnosis not present

## 2020-08-02 DIAGNOSIS — M4316 Spondylolisthesis, lumbar region: Secondary | ICD-10-CM | POA: Diagnosis not present

## 2020-08-02 DIAGNOSIS — K4090 Unilateral inguinal hernia, without obstruction or gangrene, not specified as recurrent: Secondary | ICD-10-CM | POA: Diagnosis not present

## 2020-08-02 DIAGNOSIS — M2550 Pain in unspecified joint: Secondary | ICD-10-CM | POA: Diagnosis not present

## 2020-08-02 DIAGNOSIS — M549 Dorsalgia, unspecified: Secondary | ICD-10-CM | POA: Diagnosis not present

## 2020-08-02 DIAGNOSIS — E43 Unspecified severe protein-calorie malnutrition: Secondary | ICD-10-CM | POA: Diagnosis not present

## 2020-08-02 DIAGNOSIS — J439 Emphysema, unspecified: Secondary | ICD-10-CM | POA: Diagnosis not present

## 2020-08-02 DIAGNOSIS — M25512 Pain in left shoulder: Secondary | ICD-10-CM | POA: Diagnosis not present

## 2020-08-02 DIAGNOSIS — C3490 Malignant neoplasm of unspecified part of unspecified bronchus or lung: Secondary | ICD-10-CM | POA: Diagnosis not present

## 2020-08-02 DIAGNOSIS — C3412 Malignant neoplasm of upper lobe, left bronchus or lung: Secondary | ICD-10-CM | POA: Diagnosis not present

## 2020-08-02 DIAGNOSIS — M1612 Unilateral primary osteoarthritis, left hip: Secondary | ICD-10-CM | POA: Diagnosis not present

## 2020-08-02 DIAGNOSIS — G893 Neoplasm related pain (acute) (chronic): Secondary | ICD-10-CM | POA: Diagnosis not present

## 2020-08-02 NOTE — Telephone Encounter (Signed)
Spoke with patient's brother, Benuel Ly, and have scheduled an In-home Palliative Consult for 08/20/20 @ 12 Noon.

## 2020-08-02 NOTE — Telephone Encounter (Signed)
Per [facility]. Patient has not yet received his hospital bed. I called [facility] and confirmed that the order was received. Per Bethanne Ginger, [facility] has not been able to reach the patient - multiple attempts have been made to reach the patient.  Home health nurse will reach out to Kevin Baker to coordinate the patient's care.

## 2020-08-03 DIAGNOSIS — R531 Weakness: Secondary | ICD-10-CM | POA: Diagnosis not present

## 2020-08-03 DIAGNOSIS — C3412 Malignant neoplasm of upper lobe, left bronchus or lung: Secondary | ICD-10-CM | POA: Diagnosis not present

## 2020-08-05 ENCOUNTER — Inpatient Hospital Stay (HOSPITAL_BASED_OUTPATIENT_CLINIC_OR_DEPARTMENT_OTHER): Payer: Medicare HMO | Admitting: Hospice and Palliative Medicine

## 2020-08-05 ENCOUNTER — Inpatient Hospital Stay: Payer: Medicare HMO

## 2020-08-05 ENCOUNTER — Other Ambulatory Visit: Payer: Self-pay

## 2020-08-05 ENCOUNTER — Encounter: Payer: Self-pay | Admitting: *Deleted

## 2020-08-05 ENCOUNTER — Ambulatory Visit: Payer: Medicare HMO

## 2020-08-05 ENCOUNTER — Ambulatory Visit: Admission: RE | Admit: 2020-08-05 | Payer: Medicare HMO | Source: Ambulatory Visit

## 2020-08-05 ENCOUNTER — Telehealth: Payer: Self-pay | Admitting: *Deleted

## 2020-08-05 ENCOUNTER — Encounter: Payer: Self-pay | Admitting: Internal Medicine

## 2020-08-05 ENCOUNTER — Inpatient Hospital Stay (HOSPITAL_BASED_OUTPATIENT_CLINIC_OR_DEPARTMENT_OTHER): Payer: Medicare HMO | Admitting: Internal Medicine

## 2020-08-05 DIAGNOSIS — C3412 Malignant neoplasm of upper lobe, left bronchus or lung: Secondary | ICD-10-CM

## 2020-08-05 DIAGNOSIS — G893 Neoplasm related pain (acute) (chronic): Secondary | ICD-10-CM

## 2020-08-05 DIAGNOSIS — Z66 Do not resuscitate: Secondary | ICD-10-CM

## 2020-08-05 DIAGNOSIS — C7951 Secondary malignant neoplasm of bone: Secondary | ICD-10-CM | POA: Diagnosis not present

## 2020-08-05 DIAGNOSIS — R5383 Other fatigue: Secondary | ICD-10-CM | POA: Diagnosis not present

## 2020-08-05 DIAGNOSIS — Z515 Encounter for palliative care: Secondary | ICD-10-CM | POA: Diagnosis not present

## 2020-08-05 DIAGNOSIS — Z7189 Other specified counseling: Secondary | ICD-10-CM

## 2020-08-05 DIAGNOSIS — R531 Weakness: Secondary | ICD-10-CM | POA: Diagnosis not present

## 2020-08-05 DIAGNOSIS — F17210 Nicotine dependence, cigarettes, uncomplicated: Secondary | ICD-10-CM | POA: Diagnosis not present

## 2020-08-05 DIAGNOSIS — M2550 Pain in unspecified joint: Secondary | ICD-10-CM | POA: Diagnosis not present

## 2020-08-05 DIAGNOSIS — M25512 Pain in left shoulder: Secondary | ICD-10-CM | POA: Diagnosis not present

## 2020-08-05 DIAGNOSIS — M549 Dorsalgia, unspecified: Secondary | ICD-10-CM | POA: Diagnosis not present

## 2020-08-05 DIAGNOSIS — K4090 Unilateral inguinal hernia, without obstruction or gangrene, not specified as recurrent: Secondary | ICD-10-CM | POA: Diagnosis not present

## 2020-08-05 DIAGNOSIS — C7931 Secondary malignant neoplasm of brain: Secondary | ICD-10-CM | POA: Diagnosis not present

## 2020-08-05 LAB — CBC WITH DIFFERENTIAL/PLATELET
Abs Immature Granulocytes: 0.07 10*3/uL (ref 0.00–0.07)
Basophils Absolute: 0 10*3/uL (ref 0.0–0.1)
Basophils Relative: 0 %
Eosinophils Absolute: 0 10*3/uL (ref 0.0–0.5)
Eosinophils Relative: 0 %
HCT: 28.9 % — ABNORMAL LOW (ref 39.0–52.0)
Hemoglobin: 9.4 g/dL — ABNORMAL LOW (ref 13.0–17.0)
Immature Granulocytes: 1 %
Lymphocytes Relative: 14 %
Lymphs Abs: 1.5 10*3/uL (ref 0.7–4.0)
MCH: 32.9 pg (ref 26.0–34.0)
MCHC: 32.5 g/dL (ref 30.0–36.0)
MCV: 101 fL — ABNORMAL HIGH (ref 80.0–100.0)
Monocytes Absolute: 0.7 10*3/uL (ref 0.1–1.0)
Monocytes Relative: 7 %
Neutro Abs: 8.4 10*3/uL — ABNORMAL HIGH (ref 1.7–7.7)
Neutrophils Relative %: 78 %
Platelets: 552 10*3/uL — ABNORMAL HIGH (ref 150–400)
RBC: 2.86 MIL/uL — ABNORMAL LOW (ref 4.22–5.81)
RDW: 14.2 % (ref 11.5–15.5)
WBC: 10.7 10*3/uL — ABNORMAL HIGH (ref 4.0–10.5)
nRBC: 0 % (ref 0.0–0.2)

## 2020-08-05 LAB — COMPREHENSIVE METABOLIC PANEL
ALT: 13 U/L (ref 0–44)
AST: 13 U/L — ABNORMAL LOW (ref 15–41)
Albumin: 3.3 g/dL — ABNORMAL LOW (ref 3.5–5.0)
Alkaline Phosphatase: 55 U/L (ref 38–126)
Anion gap: 8 (ref 5–15)
BUN: 16 mg/dL (ref 8–23)
CO2: 27 mmol/L (ref 22–32)
Calcium: 8.7 mg/dL — ABNORMAL LOW (ref 8.9–10.3)
Chloride: 98 mmol/L (ref 98–111)
Creatinine, Ser: 0.73 mg/dL (ref 0.61–1.24)
GFR, Estimated: >60 mL/min (ref >60)
Glucose, Bld: 121 mg/dL — ABNORMAL HIGH (ref 70–99)
Potassium: 4.3 mmol/L (ref 3.5–5.1)
Sodium: 133 mmol/L — ABNORMAL LOW (ref 135–145)
Total Bilirubin: 0.4 mg/dL (ref 0.3–1.2)
Total Protein: 6.6 g/dL (ref 6.5–8.1)

## 2020-08-05 MED ORDER — DEXAMETHASONE 4 MG PO TABS: 4.0000 mg | ORAL_TABLET | Freq: Two times a day (BID) | ORAL | 0 refills | Status: AC

## 2020-08-05 NOTE — Progress Notes (Signed)
  Oncology Nurse Navigator Documentation  Navigator Location: [facility] (08/05/20 1000)   )Navigator Encounter Type: Follow-up Appt (08/05/20 1000)                     Patient Visit Type: MedOnc (08/05/20 1000)   Barriers/Navigation Needs: No Barriers At This Time (08/05/20 1000)   Interventions: None Required (08/05/20 1000)                 met with patient and his brother during follow up visit. Pt did not have any questions or needs at this time. Pt to follow up with hospice/palliative care once radiation completed. No further navigational needs at this time.      Time Spent with Patient: 30 (08/05/20 1000)

## 2020-08-05 NOTE — Telephone Encounter (Signed)
[facility] called stating that the patients brother is very abrasive to her and that he refuses to be taught or to even tell her what medications the patient is on, stating "I know what I am doing, my wife just died from cancer" and then he walked away from her. She states she has "some strong concerned about this home" because the patient is not aware of what medications he takes or is supposed to be taking, and that he told her that he had an IV in his arm when there was none. She states that there is really nothing she can offer this family since there is no one that can be taught and the brother denied them.

## 2020-08-05 NOTE — Progress Notes (Signed)
[facility] [facility]  Telephone:([phone] [phone]   Name: Kevin Baker Date: 08/05/2020 MRN: [id number]  DOB: 27-Feb-1956  Patient Care Team: Patient, No Pcp Per as PCP - General (General Practice)    REASON FOR CONSULTATION: Kevin Baker is a 65 y.o. 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  Patient was hospitalized 05/13/2020 to 05/14/2020 with progressive left groin pain.  He was found to have an inguinal hernia, which was reducible and did not require surgery.  CT of the chest revealed a left lower lobe mass concerning for primary bronchogenic carcinoma.  Patient also had several nodules in the left upper lobe and left posterior costophrenic sulcus.  He ultimately left AMA.  Patient was again hospitalized 07/09/2020-07/19/2020 with worsening left hip pain.  CT of the left hip revealed a metastatic lesion in the left trochanter.    Patient was evaluated by Ortho but felt not to be a surgical candidate.  He was started on XRT.  Palliative care is consulted to help address goals and manage ongoing symptoms..   SOCIAL HISTORY:     reports that he has been smoking cigarettes. He has been smoking about 3.00 packs per day. He has never used smokeless tobacco. He reports previous alcohol use. He reports previous drug use.  Patient is married but separated from his wife.  His wife currently lives in [place].  Patient has been married 11 times to 67 different women.  He has a daughter who lives in [place] and another and [place].  Patient lives at home with his brother and sister-in-law.  His sister-in-law is currently under hospice care for terminal cancer.  Patient is a veteran from the [employer] and served during the [place] war.  He receives health care through the [place].  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   ADVANCE DIRECTIVES:  None on file  CODE STATUS: DNR/DNI (DNR order signed on 08/05/20)  PAST MEDICAL HISTORY: Past Medical History:  Diagnosis Date  . Alcohol use disorder, severe, dependence ([place])   . Mass of lower lobe of left lung     PAST SURGICAL HISTORY:  Past Surgical History:  Procedure Laterality Date  . BACK SURGERY    . LEG SURGERY Right   . PELVIC FRACTURE SURGERY    . VIDEO BRONCHOSCOPY WITH ENDOBRONCHIAL NAVIGATION Left 07/12/2020   Procedure: VIDEO BRONCHOSCOPY WITH ENDOBRONCHIAL NAVIGATION;  Surgeon: Ottie Glazier, MD;  Location: [facility] ORS;  Service: Thoracic;  Laterality: Left;    HEMATOLOGY/ONCOLOGY HISTORY:  Oncology History Overview Note  # MARCH 2022- LEFT LUNG CA with mets to left femur [s/p Bronch; Dr.Aleskerov]; s/p RT to left femur   MARCH 11th- MRI Brain mets  # NGS/MOLECULAR TESTS:PDL-1 = 0%.     # PALLIATIVE CARE EVALUATION: JOSH  # PAIN MANAGEMENT:    DIAGNOSIS: Left lung cancer/  STAGE:  IV       ;  GOALS: Palliative  CURRENT/MOST RECENT THERAPY : RT     Primary cancer of left upper lobe of lung ([place])  07/17/2020 Initial Diagnosis   Primary cancer of left upper lobe of lung ([place])   07/29/2020 Cancer Staging   Staging form: Lung, AJCC 8th Edition - Clinical: Stage IVB (cT2, cN0, pM1c) - Signed by Cammie Sickle,  MD on 07/29/2020 Stage prefix: Initial diagnosis     ALLERGIES:  is allergic to codeine, sulfamethoxazole-trimethoprim, lactose intolerance (gi), and no known allergies.  MEDICATIONS:  Current Outpatient Medications  Medication Sig Dispense Refill  . acetaminophen (TYLENOL) 325 MG tablet Take 500 mg by mouth every 6 (six) hours as needed.    . baclofen 5 MG TABS Take 10 mg by mouth 3 (three) times daily. 60 tablet 0  . buPROPion (WELLBUTRIN SR) 150 MG 12 hr tablet Take 150 mg by mouth daily.    . Cholecalciferol 50 MCG (2000 UT) TABS Take 2,000 Units by  mouth daily. (Patient not taking: Reported on 08/05/2020)    . dexamethasone (DECADRON) 4 MG tablet Take 1 tablet (4 mg total) by mouth 2 (two) times daily. 30 tablet 0  . diclofenac Sodium (VOLTAREN) 1 % GEL Apply 2-4 g topically 4 (four) times daily as needed. 50 g 0  . feeding supplement (ENSURE ENLIVE / ENSURE PLUS) LIQD Take 237 mLs by mouth 3 (three) times daily between meals. 42595 mL 0  . folic acid (FOLVITE) 1 MG tablet Take 1 mg by mouth daily.    [name] gabapentin (NEURONTIN) 300 MG capsule Take 300 mg by mouth 3 (three) times daily.    . hydrOXYzine (VISTARIL) 25 MG capsule Take 25 mg by mouth 4 (four) times daily as needed for anxiety.    . Multiple Vitamin (MULTIVITAMIN WITH MINERALS) TABS tablet Take 1 tablet by mouth daily.    . naloxone (NARCAN) nasal spray 4 mg/0.1 mL SPRAY 1 SPRAY INTO ONE NOSTRIL AS DIRECTED FOR OPIOID OVERDOSE (TURN PERSON ON SIDE AFTER DOSE. IF NO RESPONSE IN 2-3 MINUTES OR PERSON RESPONDS BUT RELAPSES, REPEAT USING A NEW SPRAY DEVICE AND SPRAY INTO THE OTHER NOSTRIL. CALL 911 AFTER USE.) * EMERGENCY USE ONLY *    . nicotine polacrilex (COMMIT) 4 MG lozenge DISSOLVE 1 LOZENGE MOUTH EVERY HOUR AS NEEDED TO HELP STOP SMOKING ** CALL [facility] [phone] FOR INFORMATION ABOUT SUPPORT IN QUITTING.  - MAX 24 PIECES IN 24 HOURS TO HELP STOP SMOKING ** CALL [facility] [phone] FOR INFORMATION ABOUT SUPPORT IN QUITTING.  - MAX 24 PIECES IN 24 HOURS (Patient not taking: Reported on 08/05/2020) 108 tablet 1  . omeprazole (PRILOSEC) 20 MG capsule Take 1 capsule (20 mg total) by mouth daily. 30 capsule 0  . ondansetron (ZOFRAN) 4 MG tablet Take 1 tablet (4 mg total) by mouth every 8 (eight) hours as needed for nausea or vomiting. 45 tablet 0  . oxyCODONE (OXYCONTIN) 15 mg 12 hr tablet Take 1 tablet (15 mg total) by mouth every 8 (eight) hours. 90 tablet 0  . Oxycodone HCl 10 MG TABS Take 1 tablet (10 mg total) by mouth every 4 (four) hours as needed. 60 tablet 0   . prochlorperazine (COMPAZINE) 10 MG tablet Take 1 tablet (10 mg total) by mouth every 6 (six) hours as needed for nausea or vomiting. 45 tablet 0  . traZODone (DESYREL) 50 MG tablet Take 50 mg by mouth at bedtime as needed.     No current facility-administered medications for this visit.    VITAL SIGNS: There were no vitals taken for this visit. There were no vitals filed for this visit.  Estimated body mass index is 19.8 kg/m as calculated from the following:   Height as of an earlier encounter on 08/05/20: 6' (1.829 m).   Weight as of an earlier encounter on 08/05/20: 146 lb (66.2 kg).  LABS: CBC:    Component Value Date/Time   WBC 10.7 (H) 08/05/2020 0917   HGB 9.4 (L) 08/05/2020 0917   HCT 28.9 (L) 08/05/2020 0917   PLT 552 (H) 08/05/2020 0917   MCV 101.0 (H) 08/05/2020 0917   NEUTROABS 8.4 (H) 08/05/2020 0917   LYMPHSABS 1.5 08/05/2020 0917   MONOABS 0.7 08/05/2020 0917   EOSABS 0.0 08/05/2020 0917   BASOSABS 0.0 08/05/2020 0917   Comprehensive Metabolic Panel:    Component Value Date/Time   NA 133 (L) 08/05/2020 0917   K 4.3 08/05/2020 0917   CL 98 08/05/2020 0917   CO2 27 08/05/2020 0917   BUN 16 08/05/2020 0917   CREATININE 0.73 08/05/2020 0917   GLUCOSE 121 (H) 08/05/2020 0917   CALCIUM 8.7 (L) 08/05/2020 0917   AST 13 (L) 08/05/2020 0917   ALT 13 08/05/2020 0917   ALKPHOS 55 08/05/2020 0917   BILITOT 0.4 08/05/2020 0917   PROT 6.6 08/05/2020 0917   ALBUMIN 3.3 (L) 08/05/2020 0917    RADIOGRAPHIC STUDIES: MR Brain W Wo Contrast  Result Date: 07/27/2020 CLINICAL DATA:  Non-small cell lung cancer staging. Left lower extremity weakness. EXAM: MRI HEAD WITHOUT AND WITH CONTRAST TECHNIQUE: Multiplanar, multiecho pulse sequences of the brain and surrounding structures were obtained without and with intravenous contrast. CONTRAST:  59m GADAVIST GADOBUTROL 1 MMOL/ML IV SOLN COMPARISON:  None. FINDINGS: Brain: No acute infarct, intracranial hemorrhage, midline  shift, or extra-axial fluid collection is identified. Small chronic infarcts are noted in the cerebellum bilaterally, and there are small chronic cortical infarcts in the superior aspects of both occipital lobes and right frontal lobe. T2 hyperintensities in the cerebral white matter bilaterally are nonspecific but compatible with mild-to-moderate chronic small vessel ischemic disease. There is moderate generalized cerebral atrophy. There is an 8 mm enhancing lesion in the medial aspect of the left cerebellar hemisphere with mild to moderate surrounding edema (series 18, image 45). There is no significant posterior fossa mass effect. There is a 7 mm enhancing lesion anteriorly in the left frontal lobe without edema (series 18, image 85). There is a 4 mm focus of enhancement in the anterior right temporal lobe on axial imaging (series 18, image 57) without edema. This is not visible on coronal or sagittal imaging, potentially due to slice selection, and this may reflect a metastasis or be vascular. There is thin diffuse dural enhancement over both cerebral convexities without nodularity. Vascular: Major intracranial vascular flow voids are preserved. Skull and upper cervical spine: Unremarkable bone marrow signal. Sinuses/Orbits: Unremarkable orbits. Mild mucosal thickening in the maxillary sinuses. Clear mastoid air cells. Other: None. IMPRESSION: 1. Small enhancing lesions in the cerebellum and left frontal lobe consistent with metastases. Mild-to-moderate edema surrounding the left cerebellar metastasis without significant mass effect. 2. 4 mm focus of enhancement in the anterior right temporal lobe which may reflect a third metastasis or be vascular. 3. Mild nonspecific smooth dural enhancement over both cerebral convexities. No nodularity to strongly suggest dural metastatic disease. 4. Mild-to-moderate chronic small vessel ischemic disease and moderate cerebral atrophy with multiple chronic infarcts. 5.  Electronically Signed   By: ALogan BoresM.D.   On: 07/27/2020 13:21   MR Lumbar Spine W Wo Contrast  Result Date: 07/11/2020 CLINICAL DATA:  65year old male with low back and left lower extremity pain progressed x3 months. Spiculated left lower lobe lung mass in December. EXAM: MRI LUMBAR SPINE WITHOUT AND WITH CONTRAST TECHNIQUE: Multiplanar and multiecho pulse sequences of the lumbar spine  were obtained without and with intravenous contrast. CONTRAST:  52m GADAVIST GADOBUTROL 1 MMOL/ML IV SOLN COMPARISON:  CT Chest, Abdomen, and Pelvis 05/14/2020. FINDINGS: Segmentation:  Normal on the December CT. Alignment: Grade 2 spondylolisthesis of L4 on L5, and grade 1 anterolisthesis of L5 on S1 are stable since December. No associated pars fractures. Underlying mild dextroconvex lumbar scoliosis. Vertebrae: Abnormal decreased T1 signal, STIR hyperintensity and patchy enhancement in the left sacral ala partially visible on series 7, image 11. Elsewhere the visible sacrum and left SI joint appear intact. However, left SI joint ankylosis was demonstrated on the prior CT. Subtle semicircular area of increased STIR signal in the left posterolateral L2 vertebral body (series 7, image 9) demonstrates mild enhancement, and a subtle sclerotic lesion was present here in December. However, axial MRI appearance today raises the possibility of degenerative Schmorl's node (series 8, image 15). The lesion is 16 mm diameter by 8 mm thick. Otherwise normal marrow signal in the visible lower thoracic and lumbar spine. Mild chronic T11 superior endplate compression is stable since December. Conus medullaris and cauda equina: Conus extends to the T12-L1 level. No lower spinal cord or conus signal abnormality. No abnormal intradural enhancement. No dural thickening. Cauda equina nerve roots appear normal. Paraspinal and other soft tissues: Partially visible distended urinary bladder. Trace free fluid in the pelvis. Otherwise negative  visible abdominal viscera. Negative lumbar paraspinal soft tissues. Disc levels: Capacious lower thoracic and lumbar spinal canal above L4. L4-L5: Grade 2 spondylolisthesis with advanced disc and posterior element degeneration. Severe facet hypertrophy greater on the right with degenerative facet joint fluid. Moderate spinal stenosis and bilateral lateral recess stenosis. Moderate to severe bilateral L4 foraminal stenosis. L5-S1: Mild anterolisthesis with disc and posterior element degeneration but no spinal stenosis. There is moderate right L5 foraminal stenosis. IMPRESSION: 1. Partially visible abnormal signal in the left sacral ala with patchy enhancement. This does not seem mass-like, and more resembles a sacral insufficiency fracture than metastatic disease (but see also #2). 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 2. Small 5 x 16 mm enhancing lesion in the L2 posterosuperior vertebral body is indeterminate for small bone Metastasis versus Schmorl's node. Follow-up PET-CT might best characterize further. Failing MAC, short interval 2-3 month follow-up MRI would be most valuable. 3. No other metastatic disease identified in the visible lower thoracic or lumbar spine. 4. Degenerative grade 2 spondylolisthesis at L4-L5 with advanced disc and posterior element degeneration, moderate spinal, lateral recess, and moderate to severe foraminal stenosis. Grade 1 spondylolisthesis at L5-S1 with moderate right foraminal stenosis. Electronically Signed   By: HGenevie AnnM.D.   On: 07/11/2020 08:10   CT Hip Left Wo Contrast  Result Date: 07/10/2020 CLINICAL DATA:  Left leg and arm weakness with pain since March 2021, negative radiographs EXAM: CT OF THE LEFT HIP WITHOUT CONTRAST TECHNIQUE: Multidetector CT imaging of the left hip was performed according to the standard protocol. Multiplanar CT image reconstructions were also generated. COMPARISON:  Radiograph 07/09/2020, CT 05/14/2020 FINDINGS:  Bones/Joint/Cartilage The osseous structures appear diffusely demineralized which may limit detection of small or nondisplaced fractures. Ovoid lucent lesion centered within the lesser trochanter measuring approximately 2.6 x 2.3 x 3.1 cm in size. No clear associated pathologic fracture is evident at this time. No other suspicious lytic or blastic lesions within the included margins of imaging. No other acute bony abnormality. Specifically, no fracture, subluxation, or dislocation of the left hip or included bony pelvis. More remote remote posttraumatic changes  of the left pubic root, superior and inferior rami, unchanged from comparison CT. Ankylosis left SI joint and likely prior traumatic deformity of the left ilium. Degenerative change at the pubic symphysis and left hip is similar to comparison. No sizable hip effusion. Ligaments Suboptimally assessed by CT. Muscles and Tendons No acute musculotendinous abnormality is seen. Soft tissues Atherosclerotic calcifications in the iliac arteries and proximal common and superficial femoral arteries. Circumferential thickening of the urinary bladder though possibly related to underdistention. Fluid-filled appearance of the colon, correlate for rapid transit state. IMPRESSION: 1. Ovoid lucent lesion centered within the left lesser trochanter measuring approximately 2.6 x 2.3 x 3.1 cm in size. No clear associated pathologic fracture is evident at this time. Findings concerning for metastatic lesion given known left lung lesion. 2. Remote posttraumatic changes of the left pubic root, superior and inferior rami, and left ilium unchanged from comparison CT. 3. Ankylosis left SI joint. 4. Circumferential thickening of the urinary bladder though possibly related to underdistention. Correlate with urinalysis to exclude cystitis. 5. Fluid-filled appearance of the colon, could reflect diarrheal illness/rapid transit state. 6. Atherosclerosis These results were called by telephone  at the time of interpretation on 07/10/2020 at 12:23 am to provider Dr. Tamala Julian, who verbally acknowledged these results. Electronically Signed   By: Lovena Le M.D.   On: 07/10/2020 00:23   DG C-Arm 1-60 Min-No Report  Result Date: 07/12/2020 Fluoroscopy was utilized by the requesting physician.  No radiographic interpretation.   DG Hip Unilat W or Wo Pelvis 2-3 Views Left  Result Date: 07/09/2020 CLINICAL DATA:  Acute on chronic hip pain EXAM: DG HIP (WITH OR WITHOUT PELVIS) 2-3V LEFT COMPARISON:  05/13/2020 FINDINGS: Chronic deformity of left iliac bone and pubic rami. No acute displaced fracture or malalignment is seen. There are mild degenerative changes of both hips. Vascular calcifications. IMPRESSION: No acute osseous abnormality. Chronic deformity of left iliac bone and pubic rami. Electronically Signed   By: Donavan Foil M.D.   On: 07/09/2020 21:25   CT Super D Chest W Contrast  Result Date: 07/11/2020 CLINICAL DATA:  Left lower lobe mass, EBUS planning EXAM: CT CHEST WITH CONTRAST TECHNIQUE: Multidetector CT imaging of the chest was performed using thin slice collimation for electromagnetic bronchoscopy planning purposes, with intravenous contrast. CONTRAST:  66m OMNIPAQUE IOHEXOL 300 MG/ML  SOLN COMPARISON:  CT chest, 05/14/2020 FINDINGS: Cardiovascular: Aortic atherosclerosis. Normal heart size. Three-vessel coronary artery calcifications and/or stents. No pericardial effusion. Mediastinum/Nodes: No enlarged mediastinal, hilar, or axillary lymph nodes. Thyroid gland, trachea, and esophagus demonstrate no significant findings. Lungs/Pleura: Slight interval enlargement of a large mass of the superior segment left lower lobe, measuring 4.9 x 4.4 cm, previously 4.6 x 3.8 cm when measured similarly (series 3, image 40). There is postobstructive consolidation and nodularity of the dependent left lower lobe (series 3, image 51). Significant interval enlargement of a spiculated appearing nodule of  the anterior left upper lobe measuring 1.4 x 1.2 cm, previously 1.1 x 1.0 cm when measured similarly (series 3, image 37). Unchanged small nodule of the superior segment right lower lobe measuring 4 mm (series 3, image 27). Slight interval enlargement of a nodule of the peripheral right upper lobe measuring 3 mm, previously no greater than 1-2 mm (series 3, image 35). Minimal centrilobular emphysema. No pleural effusion or pneumothorax. Upper Abdomen: No acute abnormality. Musculoskeletal: No chest wall mass or suspicious bone lesions identified. IMPRESSION: 1. Slight interval enlargement of a large mass of the superior segment left lower  lobe, measuring 4.9 x 4.4 cm, previously 4.6 x 3.8 cm when measured similarly. There is postobstructive consolidation and nodularity of the dependent left lower lobe. Findings are consistent with primary lung malignancy. 2. Significant interval enlargement of a spiculated appearing nodule of the anterior left upper lobe measuring 1.4 x 1.2 cm, previously 1.1 x 1.0 cm when measured similarly. This is concerning for a synchronous primary versus metastasis. 3. Slight interval enlargement of a nodule of the peripheral right upper lobe measuring 3 mm, previously no greater than 1-2 mm. This is again concerning for synchronous primary or metastasis. 4. Unchanged small nodule of the superior segment right lower lobe measuring 4 mm, nonspecific. 5. No mediastinal lymphadenopathy. 6. Minimal centrilobular emphysema. 7. Coronary artery disease. Aortic Atherosclerosis (ICD10-I70.0) and Emphysema (ICD10-J43.9). Electronically Signed   By: Eddie Candle M.D.   On: 07/11/2020 14:10    PERFORMANCE STATUS (ECOG) : 3 - Symptomatic, >50% confined to bed  Review of Systems Unless otherwise noted, a complete review of systems is negative.  Physical Exam General: NAD Pulmonary: unlabored Abdomen: soft, nontender, + bowel sounds GU: no suprapubic tenderness Extremities: no edema, no joint  deformities Skin: no rashes Neurological: Weakness but otherwise nonfocal  IMPRESSION: Routine follow-up visit.  Patient accompanied by his brother.  Patient symptomatically appear to be doing fairly well today.  He denies pain at present.  He reports pain regimen is effective at keeping him comfortable.  He denies other distressing symptoms.  Patient met today with Dr. Rogue Bussing and has not felt to be a candidate for systemic chemotherapy.  Patient and brother both verbalized agreement with this decision.  He is pending home-based palliative care referral.  Patient will likely benefit from initiating hospice following XRT.  We discussed CODE STATUS today.  Patient verbalized agreement DNR/DNI.  He was not interested in having his life prolonged artificially machines or being resuscitated at end-of-life.  He says that he would prefer to go as his sister-in-law did in her sleep.  I signed a DNR order for him to take home.  PLAN: -Best supportive care -Pending home-based palliative care -Recommend hospice after completion of XRT -DNR/DNI -RTC 2-3 weeks  Case and plan discussed with Dr. Rogue Bussing  Patient expressed understanding and was in agreement with this plan. He also understands that He can call the clinic at any time with any questions, concerns, or complaints.     Time Total: 15 minutes  Visit consisted of counseling and education dealing with the complex and emotionally intense issues of symptom management and palliative care in the setting of serious and potentially life-threatening illness.Greater than 50%  of this time was spent counseling and coordinating care related to the above assessment and plan.  Signed by: Altha Harm, PhD, NP-C

## 2020-08-05 NOTE — Progress Notes (Signed)
[facility] CONSULT NOTE  Patient Care Team: Patient, No Pcp Per as PCP - General (General Practice)  CHIEF COMPLAINTS/PURPOSE OF CONSULTATION:   #  Oncology History Overview Note  # MARCH 2022- LEFT LUNG CA with mets to left femur [s/p Bronch; Dr.Aleskerov]; s/p RT to left femur   MARCH 11th- MRI Brain mets  # NGS/MOLECULAR TESTS:PDL-1 = 0%.     # PALLIATIVE CARE EVALUATION: JOSH  # PAIN MANAGEMENT:    DIAGNOSIS: Left lung cancer/  STAGE:  IV       ;  GOALS: Palliative  CURRENT/MOST RECENT THERAPY : RT     Primary cancer of left upper lobe of lung ([place])  07/17/2020 Initial Diagnosis   Primary cancer of left upper lobe of lung ([place])   07/29/2020 Cancer Staging   Staging form: Lung, AJCC 8th Edition - Clinical: Stage IVB (cT2, cN0, pM1c) - Signed by Cammie Sickle, MD on 07/29/2020 Stage prefix: Initial diagnosis     HISTORY OF PRESENTING ILLNESS: Patient a poor historian/accompanied by his brother. Abdiaziz Klahn 65 y.o.  male patient with a history of smoking/active alcoholism-new diagnosis of left lung cancer/metastasis to bone-is here today with results of the MRI brain.  Patient status post radiation to the left hip.  His pain is stable.  He continues to be on narcotic pain medication.  He complains of left shoulder pain.   Patient unfortunately continues to smoke/continues to drink alcohol.  As per the brother, it is unclear if patient is taking his medications as recommended.  Review of Systems  Constitutional: Positive for malaise/fatigue and weight loss. Negative for chills, diaphoresis and fever.  HENT: Negative for nosebleeds and sore throat.   Eyes: Negative for double vision.  Respiratory: Positive for cough and shortness of breath. Negative for hemoptysis, sputum production and wheezing.   Cardiovascular: Negative for chest pain, palpitations, orthopnea and leg swelling.  Gastrointestinal: Negative for abdominal pain, blood in stool,  constipation, diarrhea, heartburn, melena, nausea and vomiting.  Genitourinary: Negative for dysuria, frequency and urgency.  Musculoskeletal: Positive for back pain and joint pain.  Skin: Negative.  Negative for itching and rash.  Neurological: Negative for dizziness, tingling, focal weakness, weakness and headaches.       Falls.  Endo/Heme/Allergies: Does not bruise/bleed easily.  Psychiatric/Behavioral: Negative for depression. The patient is not nervous/anxious and does not have insomnia.      MEDICAL HISTORY:  Past Medical History:  Diagnosis Date  . Alcohol use disorder, severe, dependence ([place])   . Mass of lower lobe of left lung     SURGICAL HISTORY: Past Surgical History:  Procedure Laterality Date  . BACK SURGERY    . LEG SURGERY Right   . PELVIC FRACTURE SURGERY    . VIDEO BRONCHOSCOPY WITH ENDOBRONCHIAL NAVIGATION Left 07/12/2020   Procedure: VIDEO BRONCHOSCOPY WITH ENDOBRONCHIAL NAVIGATION;  Surgeon: Ottie Glazier, MD;  Location: [facility] ORS;  Service: Thoracic;  Laterality: Left;    SOCIAL HISTORY: Social History   Socioeconomic History  . Marital status: Married    Spouse name: Not on file  . Number of children: Not on file  . Years of education: Not on file  . Highest education level: Not on file  Occupational History  . Not on file  Tobacco Use  . Smoking status: Current Every Day Smoker    Packs/day: 3.00    Types: Cigarettes  . Smokeless tobacco: Never Used  Substance and Sexual Activity  . Alcohol use: Not Currently  Comment: for last 3 weeks, drinking daily   . Drug use: Not Currently  . Sexual activity: Not on file  Other Topics Concern  . Not on file  Social History Narrative  . Not on file   Social Determinants of Health   Financial Resource Strain: Not on file  Food Insecurity: Not on file  Transportation Needs: Not on file  Physical Activity: Not on file  Stress: Not on file  Social Connections: Not on file  Intimate Partner  Violence: Not on file    FAMILY HISTORY: Family History  Problem Relation Age of Onset  . Heart disease Father     ALLERGIES:  is allergic to codeine, sulfamethoxazole-trimethoprim, lactose intolerance (gi), and no known allergies.  MEDICATIONS:  Current Outpatient Medications  Medication Sig Dispense Refill  . acetaminophen (TYLENOL) 325 MG tablet Take 500 mg by mouth every 6 (six) hours as needed.    . baclofen 5 MG TABS Take 10 mg by mouth 3 (three) times daily. 60 tablet 0  . buPROPion (WELLBUTRIN SR) 150 MG 12 hr tablet Take 150 mg by mouth daily.    . diclofenac Sodium (VOLTAREN) 1 % GEL Apply 2-4 g topically 4 (four) times daily as needed. 50 g 0  . feeding supplement (ENSURE ENLIVE / ENSURE PLUS) LIQD Take 237 mLs by mouth 3 (three) times daily between meals. 92119 mL 0  . folic acid (FOLVITE) 1 MG tablet Take 1 mg by mouth daily.    [name] gabapentin (NEURONTIN) 300 MG capsule Take 300 mg by mouth 3 (three) times daily.    . hydrOXYzine (VISTARIL) 25 MG capsule Take 25 mg by mouth 4 (four) times daily as needed for anxiety.    . Multiple Vitamin (MULTIVITAMIN WITH MINERALS) TABS tablet Take 1 tablet by mouth daily.    . naloxone (NARCAN) nasal spray 4 mg/0.1 mL SPRAY 1 SPRAY INTO ONE NOSTRIL AS DIRECTED FOR OPIOID OVERDOSE (TURN PERSON ON SIDE AFTER DOSE. IF NO RESPONSE IN 2-3 MINUTES OR PERSON RESPONDS BUT RELAPSES, REPEAT USING A NEW SPRAY DEVICE AND SPRAY INTO THE OTHER NOSTRIL. CALL 911 AFTER USE.) * EMERGENCY USE ONLY *    . omeprazole (PRILOSEC) 20 MG capsule Take 1 capsule (20 mg total) by mouth daily. 30 capsule 0  . ondansetron (ZOFRAN) 4 MG tablet Take 1 tablet (4 mg total) by mouth every 8 (eight) hours as needed for nausea or vomiting. 45 tablet 0  . oxyCODONE (OXYCONTIN) 15 mg 12 hr tablet Take 1 tablet (15 mg total) by mouth every 8 (eight) hours. 90 tablet 0  . Oxycodone HCl 10 MG TABS Take 1 tablet (10 mg total) by mouth every 4 (four) hours as needed. 60 tablet 0  .  prochlorperazine (COMPAZINE) 10 MG tablet Take 1 tablet (10 mg total) by mouth every 6 (six) hours as needed for nausea or vomiting. 45 tablet 0  . traZODone (DESYREL) 50 MG tablet Take 50 mg by mouth at bedtime as needed.    . Cholecalciferol 50 MCG (2000 UT) TABS Take 2,000 Units by mouth daily. (Patient not taking: Reported on 08/05/2020)    . dexamethasone (DECADRON) 4 MG tablet Take 1 tablet (4 mg total) by mouth 2 (two) times daily. 30 tablet 0  . nicotine polacrilex (COMMIT) 4 MG lozenge DISSOLVE 1 LOZENGE MOUTH EVERY HOUR AS NEEDED TO HELP STOP SMOKING ** CALL [facility] [phone] FOR INFORMATION ABOUT SUPPORT IN QUITTING.  - MAX 24 PIECES IN 24 HOURS TO HELP STOP  SMOKING ** CALL [facility] [phone] FOR INFORMATION ABOUT SUPPORT IN QUITTING.  - MAX 24 PIECES IN 24 HOURS (Patient not taking: Reported on 08/05/2020) 108 tablet 1   No current facility-administered medications for this visit.      [name]  PHYSICAL EXAMINATION: ECOG PERFORMANCE STATUS: 3 - Symptomatic, >50% confined to bed  Vitals:   08/05/20 0934  BP: 116/74  Pulse: 68  Resp: 16  Temp: (!) 96.6 F (35.9 C)  SpO2: 100%   Filed Weights   08/05/20 0934  Weight: 146 lb (66.2 kg)    Physical Exam Constitutional:      Comments: Thin built cachectic male patient.  Is in a wheelchair.  Complains of left hip pain.  HENT:     Head: Normocephalic and atraumatic.     Mouth/Throat:     Pharynx: No oropharyngeal exudate.  Eyes:     Pupils: Pupils are equal, round, and reactive to light.  Cardiovascular:     Rate and Rhythm: Normal rate and regular rhythm.  Pulmonary:     Effort: No respiratory distress.     Breath sounds: No wheezing.     Comments: Decreased air entry bilaterally. Abdominal:     General: Bowel sounds are normal. There is no distension.     Palpations: Abdomen is soft. There is no mass.     Tenderness: There is no abdominal tenderness. There is no guarding or rebound.   Musculoskeletal:        General: No tenderness. Normal range of motion.     Cervical back: Normal range of motion and neck supple.  Skin:    General: Skin is warm.  Neurological:     Mental Status: He is alert and oriented to person, place, and time.  Psychiatric:        Mood and Affect: Affect normal.      LABORATORY DATA:  I have reviewed the data as listed Lab Results  Component Value Date   WBC 10.7 (H) 08/05/2020   HGB 9.4 (L) 08/05/2020   HCT 28.9 (L) 08/05/2020   MCV 101.0 (H) 08/05/2020   PLT 552 (H) 08/05/2020   Recent Labs    07/09/20 2120 07/10/20 0608 07/16/20 0822 07/26/20 1107 08/05/20 0917  NA  --    < > 134* 137 133*  K  --    < > 4.1 4.2 4.3  CL  --    < > 95* 104 98  CO2  --    < > _0 GLUCOSE  --    < > 111* 89 121*  BUN  --    < > _1 CREATININE  --    < > 0.50* 0.79 0.73  CALCIUM  --    < > 8.7* 9.1 8.7*  GFRNONAA  --    < > >60 >60 >60  PROT 7.2  --   --  6.8 6.6  ALBUMIN 3.4*  --   --  3.3* 3.3*  AST 19  --   --  14* 13*  ALT 16  --   --  26 13  ALKPHOS 66  --   --  64 55  BILITOT 0.5  --   --  0.9 0.4  BILIDIR <0.1  --   --   --   --   IBILI NOT CALCULATED  --   --   --   --    < > = values in this interval not  displayed.    RADIOGRAPHIC STUDIES: I have personally reviewed the radiological images as listed and agreed with the findings in the report. MR Brain W Wo Contrast  Result Date: 07/27/2020 CLINICAL DATA:  Non-small cell lung cancer staging. Left lower extremity weakness. EXAM: MRI HEAD WITHOUT AND WITH CONTRAST TECHNIQUE: Multiplanar, multiecho pulse sequences of the brain and surrounding structures were obtained without and with intravenous contrast. CONTRAST:  26m GADAVIST GADOBUTROL 1 MMOL/ML IV SOLN COMPARISON:  None. FINDINGS: Brain: No acute infarct, intracranial hemorrhage, midline shift, or extra-axial fluid collection is identified. Small chronic infarcts are noted in the cerebellum bilaterally, and there are  small chronic cortical infarcts in the superior aspects of both occipital lobes and right frontal lobe. T2 hyperintensities in the cerebral white matter bilaterally are nonspecific but compatible with mild-to-moderate chronic small vessel ischemic disease. There is moderate generalized cerebral atrophy. There is an 8 mm enhancing lesion in the medial aspect of the left cerebellar hemisphere with mild to moderate surrounding edema (series 18, image 45). There is no significant posterior fossa mass effect. There is a 7 mm enhancing lesion anteriorly in the left frontal lobe without edema (series 18, image 85). There is a 4 mm focus of enhancement in the anterior right temporal lobe on axial imaging (series 18, image 57) without edema. This is not visible on coronal or sagittal imaging, potentially due to slice selection, and this may reflect a metastasis or be vascular. There is thin diffuse dural enhancement over both cerebral convexities without nodularity. Vascular: Major intracranial vascular flow voids are preserved. Skull and upper cervical spine: Unremarkable bone marrow signal. Sinuses/Orbits: Unremarkable orbits. Mild mucosal thickening in the maxillary sinuses. Clear mastoid air cells. Other: None. IMPRESSION: 1. Small enhancing lesions in the cerebellum and left frontal lobe consistent with metastases. Mild-to-moderate edema surrounding the left cerebellar metastasis without significant mass effect. 2. 4 mm focus of enhancement in the anterior right temporal lobe which may reflect a third metastasis or be vascular. 3. Mild nonspecific smooth dural enhancement over both cerebral convexities. No nodularity to strongly suggest dural metastatic disease. 4. Mild-to-moderate chronic small vessel ischemic disease and moderate cerebral atrophy with multiple chronic infarcts. 5. Electronically Signed   By: ALogan BoresM.D.   On: 07/27/2020 13:21   MR Lumbar Spine W Wo Contrast  Result Date: 07/11/2020 CLINICAL  DATA:  65year old male with low back and left lower extremity pain progressed x3 months. Spiculated left lower lobe lung mass in December. EXAM: MRI LUMBAR SPINE WITHOUT AND WITH CONTRAST TECHNIQUE: Multiplanar and multiecho pulse sequences of the lumbar spine were obtained without and with intravenous contrast. CONTRAST:  581mGADAVIST GADOBUTROL 1 MMOL/ML IV SOLN COMPARISON:  CT Chest, Abdomen, and Pelvis 05/14/2020. FINDINGS: Segmentation:  Normal on the December CT. Alignment: Grade 2 spondylolisthesis of L4 on L5, and grade 1 anterolisthesis of L5 on S1 are stable since December. No associated pars fractures. Underlying mild dextroconvex lumbar scoliosis. Vertebrae: Abnormal decreased T1 signal, STIR hyperintensity and patchy enhancement in the left sacral ala partially visible on series 7, image 11. Elsewhere the visible sacrum and left SI joint appear intact. However, left SI joint ankylosis was demonstrated on the prior CT. Subtle semicircular area of increased STIR signal in the left posterolateral L2 vertebral body (series 7, image 9) demonstrates mild enhancement, and a subtle sclerotic lesion was present here in December. However, axial MRI appearance today raises the possibility of degenerative Schmorl's node (series 8, image 15). The lesion is 16 mm  diameter by 8 mm thick. Otherwise normal marrow signal in the visible lower thoracic and lumbar spine. Mild chronic T11 superior endplate compression is stable since December. Conus medullaris and cauda equina: Conus extends to the T12-L1 level. No lower spinal cord or conus signal abnormality. No abnormal intradural enhancement. No dural thickening. Cauda equina nerve roots appear normal. Paraspinal and other soft tissues: Partially visible distended urinary bladder. Trace free fluid in the pelvis. Otherwise negative visible abdominal viscera. Negative lumbar paraspinal soft tissues. Disc levels: Capacious lower thoracic and lumbar spinal canal above L4.  L4-L5: Grade 2 spondylolisthesis with advanced disc and posterior element degeneration. Severe facet hypertrophy greater on the right with degenerative facet joint fluid. Moderate spinal stenosis and bilateral lateral recess stenosis. Moderate to severe bilateral L4 foraminal stenosis. L5-S1: Mild anterolisthesis with disc and posterior element degeneration but no spinal stenosis. There is moderate right L5 foraminal stenosis. IMPRESSION: 1. Partially visible abnormal signal in the left sacral ala with patchy enhancement. This does not seem mass-like, and more resembles a sacral insufficiency fracture than metastatic disease (but see also #2). 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 2. Small 5 x 16 mm enhancing lesion in the L2 posterosuperior vertebral body is indeterminate for small bone Metastasis versus Schmorl's node. Follow-up PET-CT might best characterize further. Failing MAC, short interval 2-3 month follow-up MRI would be most valuable. 3. No other metastatic disease identified in the visible lower thoracic or lumbar spine. 4. Degenerative grade 2 spondylolisthesis at L4-L5 with advanced disc and posterior element degeneration, moderate spinal, lateral recess, and moderate to severe foraminal stenosis. Grade 1 spondylolisthesis at L5-S1 with moderate right foraminal stenosis. Electronically Signed   By: Genevie Ann M.D.   On: 07/11/2020 08:10   CT Hip Left Wo Contrast  Result Date: 07/10/2020 CLINICAL DATA:  Left leg and arm weakness with pain since March 2021, negative radiographs EXAM: CT OF THE LEFT HIP WITHOUT CONTRAST TECHNIQUE: Multidetector CT imaging of the left hip was performed according to the standard protocol. Multiplanar CT image reconstructions were also generated. COMPARISON:  Radiograph 07/09/2020, CT 05/14/2020 FINDINGS: Bones/Joint/Cartilage The osseous structures appear diffusely demineralized which may limit detection of small or nondisplaced fractures. Ovoid lucent lesion  centered within the lesser trochanter measuring approximately 2.6 x 2.3 x 3.1 cm in size. No clear associated pathologic fracture is evident at this time. No other suspicious lytic or blastic lesions within the included margins of imaging. No other acute bony abnormality. Specifically, no fracture, subluxation, or dislocation of the left hip or included bony pelvis. More remote remote posttraumatic changes of the left pubic root, superior and inferior rami, unchanged from comparison CT. Ankylosis left SI joint and likely prior traumatic deformity of the left ilium. Degenerative change at the pubic symphysis and left hip is similar to comparison. No sizable hip effusion. Ligaments Suboptimally assessed by CT. Muscles and Tendons No acute musculotendinous abnormality is seen. Soft tissues Atherosclerotic calcifications in the iliac arteries and proximal common and superficial femoral arteries. Circumferential thickening of the urinary bladder though possibly related to underdistention. Fluid-filled appearance of the colon, correlate for rapid transit state. IMPRESSION: 1. Ovoid lucent lesion centered within the left lesser trochanter measuring approximately 2.6 x 2.3 x 3.1 cm in size. No clear associated pathologic fracture is evident at this time. Findings concerning for metastatic lesion given known left lung lesion. 2. Remote posttraumatic changes of the left pubic root, superior and inferior rami, and left ilium unchanged from comparison CT. 3. Ankylosis  left SI joint. 4. Circumferential thickening of the urinary bladder though possibly related to underdistention. Correlate with urinalysis to exclude cystitis. 5. Fluid-filled appearance of the colon, could reflect diarrheal illness/rapid transit state. 6. Atherosclerosis These results were called by telephone at the time of interpretation on 07/10/2020 at 12:23 am to provider Dr. Tamala Julian, who verbally acknowledged these results. Electronically Signed   By: Lovena Le M.D.   On: 07/10/2020 00:23   DG C-Arm 1-60 Min-No Report  Result Date: 07/12/2020 Fluoroscopy was utilized by the requesting physician.  No radiographic interpretation.   DG Hip Unilat W or Wo Pelvis 2-3 Views Left  Result Date: 07/09/2020 CLINICAL DATA:  Acute on chronic hip pain EXAM: DG HIP (WITH OR WITHOUT PELVIS) 2-3V LEFT COMPARISON:  05/13/2020 FINDINGS: Chronic deformity of left iliac bone and pubic rami. No acute displaced fracture or malalignment is seen. There are mild degenerative changes of both hips. Vascular calcifications. IMPRESSION: No acute osseous abnormality. Chronic deformity of left iliac bone and pubic rami. Electronically Signed   By: Donavan Foil M.D.   On: 07/09/2020 21:25   CT Super D Chest W Contrast  Result Date: 07/11/2020 CLINICAL DATA:  Left lower lobe mass, EBUS planning EXAM: CT CHEST WITH CONTRAST TECHNIQUE: Multidetector CT imaging of the chest was performed using thin slice collimation for electromagnetic bronchoscopy planning purposes, with intravenous contrast. CONTRAST:  78m OMNIPAQUE IOHEXOL 300 MG/ML  SOLN COMPARISON:  CT chest, 05/14/2020 FINDINGS: Cardiovascular: Aortic atherosclerosis. Normal heart size. Three-vessel coronary artery calcifications and/or stents. No pericardial effusion. Mediastinum/Nodes: No enlarged mediastinal, hilar, or axillary lymph nodes. Thyroid gland, trachea, and esophagus demonstrate no significant findings. Lungs/Pleura: Slight interval enlargement of a large mass of the superior segment left lower lobe, measuring 4.9 x 4.4 cm, previously 4.6 x 3.8 cm when measured similarly (series 3, image 40). There is postobstructive consolidation and nodularity of the dependent left lower lobe (series 3, image 51). Significant interval enlargement of a spiculated appearing nodule of the anterior left upper lobe measuring 1.4 x 1.2 cm, previously 1.1 x 1.0 cm when measured similarly (series 3, image 37). Unchanged small nodule of the  superior segment right lower lobe measuring 4 mm (series 3, image 27). Slight interval enlargement of a nodule of the peripheral right upper lobe measuring 3 mm, previously no greater than 1-2 mm (series 3, image 35). Minimal centrilobular emphysema. No pleural effusion or pneumothorax. Upper Abdomen: No acute abnormality. Musculoskeletal: No chest wall mass or suspicious bone lesions identified. IMPRESSION: 1. Slight interval enlargement of a large mass of the superior segment left lower lobe, measuring 4.9 x 4.4 cm, previously 4.6 x 3.8 cm when measured similarly. There is postobstructive consolidation and nodularity of the dependent left lower lobe. Findings are consistent with primary lung malignancy. 2. Significant interval enlargement of a spiculated appearing nodule of the anterior left upper lobe measuring 1.4 x 1.2 cm, previously 1.1 x 1.0 cm when measured similarly. This is concerning for a synchronous primary versus metastasis. 3. Slight interval enlargement of a nodule of the peripheral right upper lobe measuring 3 mm, previously no greater than 1-2 mm. This is again concerning for synchronous primary or metastasis. 4. Unchanged small nodule of the superior segment right lower lobe measuring 4 mm, nonspecific. 5. No mediastinal lymphadenopathy. 6. Minimal centrilobular emphysema. 7. Coronary artery disease. Aortic Atherosclerosis (ICD10-I70.0) and Emphysema (ICD10-J43.9). Electronically Signed   By: AEddie CandleM.D.   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  #Patient poor candidate for systemic therapy given his poor performance status/poor social support/patient noncompliance issues.  Patient had a very high risk of complications from side effects of chemotherapy.   I discussed hospice with the patient after finishing whole brain radiation.  Patient/brother in agreement.  Discussed with Billey Chang; patient awaiting appointment with outpatient palliative care on April 9th.   # Brain metastases-3/14-MRI brain reviewed shows multifocal metastatic disease to the brain; with edema noted in the left cerebellum.  on MRI brain; awaiting WBRT [3/29]; will refill dexamethasone.  # Pain sec to metastatic diease- STABLE;  On OxyContin 15 every 8 hours; along with breakthrough pain medication. Refilled baclofen.  # Smoking: nicotine lozenges OTC.    # hx of alcoholism-patient is not interested in cessation.  #Reviewed with the patient's brother, Linna Hoff of my above recommendations.  # DISPOSITION: # NO IVFs; NO dex today # follow up in 3 weeks-n MD; labs- cbc/cmp- Dr.B  # I reviewed the blood work- with the patient in detail; also reviewed the imaging independently [as summarized above]; and with the patient in detail.     All questions were answered. The patient knows to call the clinic with any problems, questions or concerns.       Cammie Sickle, MD 08/05/2020 10:51 AM

## 2020-08-05 NOTE — Assessment & Plan Note (Addendum)
#  Stage IV LUL lung cancer--non-small cell favor adenocarcinoma; metastasis to the bone; brain metastases [see below]; NGS-negative for any targetable mutations- except Kras; TMB-H.  #Patient poor candidate for systemic therapy given his poor performance status/poor social support/patient noncompliance issues.  Patient had a very high risk of complications from side effects of chemotherapy.  I discussed hospice with the patient after finishing whole brain radiation.  Patient/brother in agreement.  Discussed with Billey Chang; patient awaiting appointment with outpatient palliative care on April 9th.   # Brain metastases-3/14-MRI brain reviewed shows multifocal metastatic disease to the brain; with edema noted in the left cerebellum.  on MRI brain; awaiting WBRT [3/29]; will refill dexamethasone.  # Pain sec to metastatic diease- STABLE;  On OxyContin 15 every 8 hours; along with breakthrough pain medication. Refilled baclofen.  # Smoking: nicotine lozenges OTC.    # hx of alcoholism-patient is not interested in cessation.  #Reviewed with the patient's brother, Linna Hoff of my above recommendations.  # DISPOSITION: # NO IVFs; NO dex today # follow up in 3 weeks-n MD; labs- cbc/cmp- Dr.B  # I reviewed the blood work- with the patient in detail; also reviewed the imaging independently [as summarized above]; and with the patient in detail.

## 2020-08-05 NOTE — Progress Notes (Signed)
Would like to know if you can start prescribing the welbutrin.   Brother Kevin Baker expressed concern about pt drinking and smoking. Especially while on medication he is not supposed to be drinking.

## 2020-08-06 ENCOUNTER — Ambulatory Visit
Admission: RE | Admit: 2020-08-06 | Discharge: 2020-08-06 | Disposition: A | Discharge status: Home / Self Care | Payer: Medicare HMO | Source: Ambulatory Visit | Attending: Radiation Oncology | Admitting: Radiation Oncology

## 2020-08-06 ENCOUNTER — Inpatient Hospital Stay: Payer: Medicare HMO

## 2020-08-06 DIAGNOSIS — F17210 Nicotine dependence, cigarettes, uncomplicated: Secondary | ICD-10-CM | POA: Diagnosis not present

## 2020-08-06 DIAGNOSIS — M5136 Other intervertebral disc degeneration, lumbar region: Secondary | ICD-10-CM | POA: Diagnosis not present

## 2020-08-06 DIAGNOSIS — C3412 Malignant neoplasm of upper lobe, left bronchus or lung: Secondary | ICD-10-CM | POA: Diagnosis not present

## 2020-08-06 DIAGNOSIS — K4090 Unilateral inguinal hernia, without obstruction or gangrene, not specified as recurrent: Secondary | ICD-10-CM | POA: Diagnosis not present

## 2020-08-06 DIAGNOSIS — M549 Dorsalgia, unspecified: Secondary | ICD-10-CM | POA: Diagnosis not present

## 2020-08-06 DIAGNOSIS — D630 Anemia in neoplastic disease: Secondary | ICD-10-CM | POA: Diagnosis not present

## 2020-08-06 DIAGNOSIS — G893 Neoplasm related pain (acute) (chronic): Secondary | ICD-10-CM | POA: Diagnosis not present

## 2020-08-06 DIAGNOSIS — C7951 Secondary malignant neoplasm of bone: Secondary | ICD-10-CM | POA: Diagnosis not present

## 2020-08-06 DIAGNOSIS — J439 Emphysema, unspecified: Secondary | ICD-10-CM | POA: Diagnosis not present

## 2020-08-06 DIAGNOSIS — C7931 Secondary malignant neoplasm of brain: Secondary | ICD-10-CM | POA: Diagnosis not present

## 2020-08-06 DIAGNOSIS — M2550 Pain in unspecified joint: Secondary | ICD-10-CM | POA: Diagnosis not present

## 2020-08-06 DIAGNOSIS — M25512 Pain in left shoulder: Secondary | ICD-10-CM | POA: Diagnosis not present

## 2020-08-06 DIAGNOSIS — R5383 Other fatigue: Secondary | ICD-10-CM | POA: Diagnosis not present

## 2020-08-06 DIAGNOSIS — M1612 Unilateral primary osteoarthritis, left hip: Secondary | ICD-10-CM | POA: Diagnosis not present

## 2020-08-06 DIAGNOSIS — E43 Unspecified severe protein-calorie malnutrition: Secondary | ICD-10-CM | POA: Diagnosis not present

## 2020-08-06 DIAGNOSIS — M4316 Spondylolisthesis, lumbar region: Secondary | ICD-10-CM | POA: Diagnosis not present

## 2020-08-07 ENCOUNTER — Other Ambulatory Visit: Payer: Self-pay

## 2020-08-07 ENCOUNTER — Inpatient Hospital Stay: Payer: Medicare HMO

## 2020-08-07 ENCOUNTER — Ambulatory Visit
Admission: RE | Admit: 2020-08-07 | Discharge: 2020-08-07 | Disposition: A | Discharge status: Home / Self Care | Payer: Medicare HMO | Source: Ambulatory Visit | Attending: Radiation Oncology | Admitting: Radiation Oncology

## 2020-08-07 DIAGNOSIS — M2550 Pain in unspecified joint: Secondary | ICD-10-CM | POA: Diagnosis not present

## 2020-08-07 DIAGNOSIS — F17210 Nicotine dependence, cigarettes, uncomplicated: Secondary | ICD-10-CM | POA: Diagnosis not present

## 2020-08-07 DIAGNOSIS — C7951 Secondary malignant neoplasm of bone: Secondary | ICD-10-CM | POA: Diagnosis not present

## 2020-08-07 DIAGNOSIS — M25512 Pain in left shoulder: Secondary | ICD-10-CM | POA: Diagnosis not present

## 2020-08-07 DIAGNOSIS — K4090 Unilateral inguinal hernia, without obstruction or gangrene, not specified as recurrent: Secondary | ICD-10-CM | POA: Diagnosis not present

## 2020-08-07 DIAGNOSIS — M549 Dorsalgia, unspecified: Secondary | ICD-10-CM | POA: Diagnosis not present

## 2020-08-07 DIAGNOSIS — C7931 Secondary malignant neoplasm of brain: Secondary | ICD-10-CM | POA: Diagnosis not present

## 2020-08-07 DIAGNOSIS — C3412 Malignant neoplasm of upper lobe, left bronchus or lung: Secondary | ICD-10-CM | POA: Diagnosis not present

## 2020-08-07 DIAGNOSIS — R5383 Other fatigue: Secondary | ICD-10-CM | POA: Diagnosis not present

## 2020-08-08 ENCOUNTER — Inpatient Hospital Stay: Payer: Medicare HMO

## 2020-08-08 ENCOUNTER — Ambulatory Visit
Admission: RE | Admit: 2020-08-08 | Discharge: 2020-08-08 | Disposition: A | Discharge status: Home / Self Care | Payer: Medicare HMO | Source: Ambulatory Visit | Attending: Radiation Oncology | Admitting: Radiation Oncology

## 2020-08-08 DIAGNOSIS — F17210 Nicotine dependence, cigarettes, uncomplicated: Secondary | ICD-10-CM | POA: Diagnosis not present

## 2020-08-08 DIAGNOSIS — K4090 Unilateral inguinal hernia, without obstruction or gangrene, not specified as recurrent: Secondary | ICD-10-CM | POA: Diagnosis not present

## 2020-08-08 DIAGNOSIS — M25512 Pain in left shoulder: Secondary | ICD-10-CM | POA: Diagnosis not present

## 2020-08-08 DIAGNOSIS — C3412 Malignant neoplasm of upper lobe, left bronchus or lung: Secondary | ICD-10-CM | POA: Diagnosis not present

## 2020-08-08 DIAGNOSIS — C7931 Secondary malignant neoplasm of brain: Secondary | ICD-10-CM | POA: Diagnosis not present

## 2020-08-08 DIAGNOSIS — M2550 Pain in unspecified joint: Secondary | ICD-10-CM | POA: Diagnosis not present

## 2020-08-08 DIAGNOSIS — C7951 Secondary malignant neoplasm of bone: Secondary | ICD-10-CM | POA: Diagnosis not present

## 2020-08-08 DIAGNOSIS — M549 Dorsalgia, unspecified: Secondary | ICD-10-CM | POA: Diagnosis not present

## 2020-08-08 DIAGNOSIS — R5383 Other fatigue: Secondary | ICD-10-CM | POA: Diagnosis not present

## 2020-08-09 ENCOUNTER — Ambulatory Visit
Admission: RE | Admit: 2020-08-09 | Discharge: 2020-08-09 | Disposition: A | Discharge status: Home / Self Care | Payer: Medicare HMO | Source: Ambulatory Visit | Attending: Radiation Oncology | Admitting: Radiation Oncology

## 2020-08-09 ENCOUNTER — Inpatient Hospital Stay: Payer: Medicare HMO

## 2020-08-09 DIAGNOSIS — C7931 Secondary malignant neoplasm of brain: Secondary | ICD-10-CM | POA: Diagnosis not present

## 2020-08-09 DIAGNOSIS — M25512 Pain in left shoulder: Secondary | ICD-10-CM | POA: Diagnosis not present

## 2020-08-09 DIAGNOSIS — C3412 Malignant neoplasm of upper lobe, left bronchus or lung: Secondary | ICD-10-CM | POA: Diagnosis not present

## 2020-08-09 DIAGNOSIS — M549 Dorsalgia, unspecified: Secondary | ICD-10-CM | POA: Diagnosis not present

## 2020-08-09 DIAGNOSIS — F17210 Nicotine dependence, cigarettes, uncomplicated: Secondary | ICD-10-CM | POA: Diagnosis not present

## 2020-08-09 DIAGNOSIS — M2550 Pain in unspecified joint: Secondary | ICD-10-CM | POA: Diagnosis not present

## 2020-08-09 DIAGNOSIS — R5383 Other fatigue: Secondary | ICD-10-CM | POA: Diagnosis not present

## 2020-08-09 DIAGNOSIS — C7951 Secondary malignant neoplasm of bone: Secondary | ICD-10-CM | POA: Diagnosis not present

## 2020-08-09 DIAGNOSIS — K4090 Unilateral inguinal hernia, without obstruction or gangrene, not specified as recurrent: Secondary | ICD-10-CM | POA: Diagnosis not present

## 2020-08-12 ENCOUNTER — Inpatient Hospital Stay: Payer: Medicare HMO

## 2020-08-12 ENCOUNTER — Other Ambulatory Visit: Payer: Self-pay

## 2020-08-12 ENCOUNTER — Ambulatory Visit
Admission: RE | Admit: 2020-08-12 | Discharge: 2020-08-12 | Disposition: A | Discharge status: Home / Self Care | Payer: Medicare HMO | Source: Ambulatory Visit | Attending: Radiation Oncology | Admitting: Radiation Oncology

## 2020-08-12 DIAGNOSIS — K4090 Unilateral inguinal hernia, without obstruction or gangrene, not specified as recurrent: Secondary | ICD-10-CM | POA: Diagnosis not present

## 2020-08-12 DIAGNOSIS — C7931 Secondary malignant neoplasm of brain: Secondary | ICD-10-CM | POA: Diagnosis not present

## 2020-08-12 DIAGNOSIS — C3412 Malignant neoplasm of upper lobe, left bronchus or lung: Secondary | ICD-10-CM | POA: Diagnosis not present

## 2020-08-12 DIAGNOSIS — R5383 Other fatigue: Secondary | ICD-10-CM | POA: Diagnosis not present

## 2020-08-12 DIAGNOSIS — M549 Dorsalgia, unspecified: Secondary | ICD-10-CM | POA: Diagnosis not present

## 2020-08-12 DIAGNOSIS — M25512 Pain in left shoulder: Secondary | ICD-10-CM | POA: Diagnosis not present

## 2020-08-12 DIAGNOSIS — C7951 Secondary malignant neoplasm of bone: Secondary | ICD-10-CM | POA: Diagnosis not present

## 2020-08-12 DIAGNOSIS — F17210 Nicotine dependence, cigarettes, uncomplicated: Secondary | ICD-10-CM | POA: Diagnosis not present

## 2020-08-12 DIAGNOSIS — M2550 Pain in unspecified joint: Secondary | ICD-10-CM | POA: Diagnosis not present

## 2020-08-13 ENCOUNTER — Other Ambulatory Visit: Payer: Self-pay | Admitting: Internal Medicine

## 2020-08-13 ENCOUNTER — Ambulatory Visit: Payer: Medicare HMO

## 2020-08-13 ENCOUNTER — Ambulatory Visit
Admission: RE | Admit: 2020-08-13 | Discharge: 2020-08-13 | Disposition: A | Discharge status: Home / Self Care | Payer: Medicare HMO | Source: Ambulatory Visit | Attending: Radiation Oncology | Admitting: Radiation Oncology

## 2020-08-13 ENCOUNTER — Inpatient Hospital Stay: Payer: Medicare HMO

## 2020-08-13 DIAGNOSIS — C7931 Secondary malignant neoplasm of brain: Secondary | ICD-10-CM | POA: Diagnosis not present

## 2020-08-13 DIAGNOSIS — C3412 Malignant neoplasm of upper lobe, left bronchus or lung: Secondary | ICD-10-CM | POA: Diagnosis not present

## 2020-08-13 DIAGNOSIS — M2550 Pain in unspecified joint: Secondary | ICD-10-CM | POA: Diagnosis not present

## 2020-08-13 DIAGNOSIS — M25512 Pain in left shoulder: Secondary | ICD-10-CM | POA: Diagnosis not present

## 2020-08-13 DIAGNOSIS — R5383 Other fatigue: Secondary | ICD-10-CM | POA: Diagnosis not present

## 2020-08-13 DIAGNOSIS — K4090 Unilateral inguinal hernia, without obstruction or gangrene, not specified as recurrent: Secondary | ICD-10-CM | POA: Diagnosis not present

## 2020-08-13 DIAGNOSIS — F17210 Nicotine dependence, cigarettes, uncomplicated: Secondary | ICD-10-CM | POA: Diagnosis not present

## 2020-08-13 DIAGNOSIS — C7951 Secondary malignant neoplasm of bone: Secondary | ICD-10-CM | POA: Diagnosis not present

## 2020-08-13 DIAGNOSIS — M549 Dorsalgia, unspecified: Secondary | ICD-10-CM | POA: Diagnosis not present

## 2020-08-14 ENCOUNTER — Ambulatory Visit
Admission: RE | Admit: 2020-08-14 | Discharge: 2020-08-14 | Disposition: A | Discharge status: Home / Self Care | Payer: Medicare HMO | Source: Ambulatory Visit | Attending: Radiation Oncology | Admitting: Radiation Oncology

## 2020-08-14 DIAGNOSIS — M549 Dorsalgia, unspecified: Secondary | ICD-10-CM | POA: Diagnosis not present

## 2020-08-14 DIAGNOSIS — C3412 Malignant neoplasm of upper lobe, left bronchus or lung: Secondary | ICD-10-CM | POA: Diagnosis not present

## 2020-08-14 DIAGNOSIS — M2550 Pain in unspecified joint: Secondary | ICD-10-CM | POA: Diagnosis not present

## 2020-08-14 DIAGNOSIS — R5383 Other fatigue: Secondary | ICD-10-CM | POA: Diagnosis not present

## 2020-08-14 DIAGNOSIS — C7951 Secondary malignant neoplasm of bone: Secondary | ICD-10-CM | POA: Diagnosis not present

## 2020-08-14 DIAGNOSIS — M25512 Pain in left shoulder: Secondary | ICD-10-CM | POA: Diagnosis not present

## 2020-08-14 DIAGNOSIS — C7931 Secondary malignant neoplasm of brain: Secondary | ICD-10-CM | POA: Diagnosis not present

## 2020-08-14 DIAGNOSIS — K4090 Unilateral inguinal hernia, without obstruction or gangrene, not specified as recurrent: Secondary | ICD-10-CM | POA: Diagnosis not present

## 2020-08-14 DIAGNOSIS — F17210 Nicotine dependence, cigarettes, uncomplicated: Secondary | ICD-10-CM | POA: Diagnosis not present

## 2020-08-19 DIAGNOSIS — J439 Emphysema, unspecified: Secondary | ICD-10-CM | POA: Diagnosis not present

## 2020-08-19 DIAGNOSIS — G893 Neoplasm related pain (acute) (chronic): Secondary | ICD-10-CM | POA: Diagnosis not present

## 2020-08-19 DIAGNOSIS — M4316 Spondylolisthesis, lumbar region: Secondary | ICD-10-CM | POA: Diagnosis not present

## 2020-08-19 DIAGNOSIS — M5136 Other intervertebral disc degeneration, lumbar region: Secondary | ICD-10-CM | POA: Diagnosis not present

## 2020-08-19 DIAGNOSIS — C7951 Secondary malignant neoplasm of bone: Secondary | ICD-10-CM | POA: Diagnosis not present

## 2020-08-19 DIAGNOSIS — E43 Unspecified severe protein-calorie malnutrition: Secondary | ICD-10-CM | POA: Diagnosis not present

## 2020-08-19 DIAGNOSIS — M1612 Unilateral primary osteoarthritis, left hip: Secondary | ICD-10-CM | POA: Diagnosis not present

## 2020-08-19 DIAGNOSIS — D630 Anemia in neoplastic disease: Secondary | ICD-10-CM | POA: Diagnosis not present

## 2020-08-19 DIAGNOSIS — C3412 Malignant neoplasm of upper lobe, left bronchus or lung: Secondary | ICD-10-CM | POA: Diagnosis not present

## 2020-08-20 ENCOUNTER — Other Ambulatory Visit: Payer: Self-pay

## 2020-08-20 ENCOUNTER — Other Ambulatory Visit: Payer: Medicare HMO | Admitting: Nurse Practitioner

## 2020-08-20 ENCOUNTER — Encounter: Payer: Self-pay | Admitting: Nurse Practitioner

## 2020-08-20 DIAGNOSIS — Z515 Encounter for palliative care: Secondary | ICD-10-CM | POA: Diagnosis not present

## 2020-08-20 DIAGNOSIS — R630 Anorexia: Secondary | ICD-10-CM

## 2020-08-20 DIAGNOSIS — C3490 Malignant neoplasm of unspecified part of unspecified bronchus or lung: Secondary | ICD-10-CM | POA: Diagnosis not present

## 2020-08-20 NOTE — Progress Notes (Signed)
[facility] Consult Note Telephone: [phone]  Fax: [phone]  PATIENT NAME: Kevin Baker DOB: [birth year] MRN: [id number]  REFERRING PROVIDER:  Irean Hong, NP [facility] [place],  [place] [address]   I was asked by Kevin Chang NP to see Kevin Baker for Palliative consult for complex medical decision making  RESPONSIBLE PARTY:   Self; Kevin Baker (brother) cell:  [phone]  1. Advance Care Planning; DNR Will review case with Hospice Physicians for elgiblity  2. Goals of Care: Goals include to maximize quality of life and symptom management. Our advance care planning conversation included a discussion about:     The value and importance of advance care planning   Exploration of personal, cultural or spiritual beliefs that might influence medical decisions   Exploration of goals of care in the event of a sudden injury or illness   Identification and preparation of a healthcare agent   Review and updating or creation of an advance directive document.  3. Palliative care encounter; Palliative care encounter; Palliative medicine team will continue to support patient, patient's family, and medical team. Visit consisted of counseling and education dealing with the complex and emotionally intense issues of symptom management and palliative care in the setting of serious and potentially life-threatening illness  4. 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 More than 50% of the time in this consultation was spent coordinating communication.   HISTORY OF PRESENT ILLNESS:  Kevin Baker is a 65 y.o. 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 Patient was hospitalized 12/27/2021to 05/14/2020 with progressive left groin pain. He was found to have an inguinal hernia, which was reducible and did not require surgery. CT of the chest revealed a left lower lobe mass concerning for primary bronchogenic carcinoma, several nodules in the left upper lobe and left posterior costophrenic sulcus. He ultimately left AMA. Patient was again hospitalized 07/09/2020-07/19/2020 with worsening left hip pain. CT of the left hip revealed a metastatic lesion in the left trochanter. Patient was evaluated by Ortho but felt not to be a surgical candidate. Kevin Baker was started on XRT which has since been completed. Kevin Baker was made a DNR/DNI. In person initial PC home visit, I called Kevin Baker, Kevin Baker brother with whom he lives with to confirm initial PC visit and covid screening negative, discuss Kevin Baker to attend visit. Kevin Baker and I visited Kevin Baker who was sitting in a chair in the backyard with his brother, Kevin Baker present. We talked about purpose of PC visit. We talked about past medical history reviewed, reviewed family, social history. We talked about cancer diagnosis, treatment and completion of radiation therapy. We talked about medical goals with wishes for focus on quality of life, comfort. We talked about symptoms, appetite which is poor, weight loss of > 40 lbs. Kevin Baker endorses he has been familiar with Hospice services. We talked Hospice benefit under Medicare. We talked about Hospice services provided. We talked about role PC in poc. [name] endorses his wishes are to have Hospice if eligible. We talked about will review with Hospice Physicians and then return call to Kevin Baker  with determined eligibility. Kevin Baker in agreement, at that time if wishes to continue with Hospice will request order, if not will schedule f/u PC visit. Therapeutic listening, emotional support  provided. Questions answered to satisfaction. Contact information.  Oncology History Overview Note   # MARCH 2022- LEFT LUNG CA with mets to left femur [s/p Bronch; Kevin Baker]; s/p RT to left femur   MARCH 11th- MRI Brain mets  # NGS/MOLECULAR TESTS:PDL-1 = 0%.     # PALLIATIVE CARE EVALUATION: Kevin Baker  # PAIN MANAGEMENT:    DIAGNOSIS: Left lung cancer/  STAGE:  IV       ;  GOALS: Palliative  CURRENT/MOST RECENT THERAPY : RT     Primary cancer of left upper lobe of lung ([facility])   07/17/2020 Initial Diagnosis    Primary cancer of left upper lobe of lung ([place])    07/29/2020 Cancer Staging    Staging form: Lung, AJCC 8th Edition - Clinical: Stage IVB (cT2, cN0, pM1c) - Signed by Kevin Sickle, MD on 07/29/2020 Stage prefix: Initial diagnosis      Palliative Care was asked to help address goals of care.   ROS: 10pt system reviewed all negative except weight loss, fatigue, generalized weakenss  SOCIAL HISTORY:    Reports that he has been smoking cigarettes. He has been smoking about 3.00 packs per day. He has never used smokeless tobacco. He reports previous alcohol use. He reports previous drug use.  Patient is married but separated from his wife. His wife currently lives in [place]. Patient has been married 11 times to 46 different women. He has a daughter who lives in [place] and another and [place]. Patient lives at home with his brother and sister-in-law. His sister-in-law is currently under hospice care for terminal cancer. Patient is a veteran from the [place] [employer] and served during the [place] war. He receives health care through the [place]. 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  Family hx: reviewed  I reviewed the patient chart, labs, medications and all admission documents from the facility here. I reviewed the patient, all hospital/facility records including H&P,  discharge summary, medications, Lab tests ect. I discussed case with staff and discussed diagnosis and treatment plans with staff.   *This note was dictated using voice recognition software.  Despite best efforts to proofread, errors can occur which can change the meaning.  Any change was purely unintentional.  CODE STATUS: DNR  PPS: 50% HOSPICE ELIGIBILITY/DIAGNOSIS: TBD  PAST MEDICAL HISTORY:  Past Medical History:  Diagnosis Date  . Alcohol use disorder, severe, dependence ([place])   . Mass of lower lobe of left lung     SOCIAL HX:  Social History   Tobacco Use  . Smoking status: Current Every Day Smoker    Packs/day: 3.00    Types: Cigarettes  . Smokeless tobacco: Never Used  Substance Use Topics  . Alcohol use: Not Currently    Comment: for last 3 weeks, drinking daily     ALLERGIES:  Allergies  Allergen Reactions  . Codeine Nausea And Vomiting  . Sulfamethoxazole-Trimethoprim Nausea And Vomiting  . Lactose Intolerance (Gi) Diarrhea  . No Known Allergies Nausea And Vomiting    Other reaction(s): Influenza-like illness     PERTINENT MEDICATIONS:  Outpatient Encounter Medications as of 08/20/2020  Medication Sig  . acetaminophen (TYLENOL) 325 MG tablet Take 500 mg by mouth every 6 (six) hours as needed.  [name]  baclofen (LIORESAL) 10 MG tablet TAKE 1 TABLET BY MOUTH 3 TIMES DAILY  . buPROPion (WELLBUTRIN SR) 150 MG 12 hr tablet Take 150 mg by mouth daily.  . Cholecalciferol 50 MCG (2000 UT) TABS Take 2,000 Units by mouth daily. (Patient not taking: Reported on 08/05/2020)  . dexamethasone (DECADRON) 4 MG tablet Take 1 tablet (4 mg total) by mouth 2 (two) times daily.  . diclofenac Sodium (VOLTAREN) 1 % GEL Apply 2-4 g topically 4 (four) times daily as needed.  . feeding supplement (ENSURE ENLIVE / ENSURE PLUS) LIQD Take 237 mLs by mouth 3 (three) times daily between meals.  . folic acid (FOLVITE) 1 MG tablet Take 1 mg by mouth daily.  [name] gabapentin (NEURONTIN) 300 MG capsule  Take 300 mg by mouth 3 (three) times daily.  . hydrOXYzine (VISTARIL) 25 MG capsule Take 25 mg by mouth 4 (four) times daily as needed for anxiety.  . Multiple Vitamin (MULTIVITAMIN WITH MINERALS) TABS tablet Take 1 tablet by mouth daily.  . naloxone (NARCAN) nasal spray 4 mg/0.1 mL SPRAY 1 SPRAY INTO ONE NOSTRIL AS DIRECTED FOR OPIOID OVERDOSE (TURN PERSON ON SIDE AFTER DOSE. IF NO RESPONSE IN 2-3 MINUTES OR PERSON RESPONDS BUT RELAPSES, REPEAT USING A NEW SPRAY DEVICE AND SPRAY INTO THE OTHER NOSTRIL. CALL 911 AFTER USE.) * EMERGENCY USE ONLY *  . nicotine polacrilex (COMMIT) 4 MG lozenge DISSOLVE 1 LOZENGE MOUTH EVERY HOUR AS NEEDED TO HELP STOP SMOKING ** CALL [facility] [phone] FOR INFORMATION ABOUT SUPPORT IN QUITTING.  - MAX 24 PIECES IN 24 HOURS TO HELP STOP SMOKING ** CALL [facility] [phone] FOR INFORMATION ABOUT SUPPORT IN QUITTING.  - MAX 24 PIECES IN 24 HOURS (Patient not taking: Reported on 08/05/2020)  . omeprazole (PRILOSEC) 20 MG capsule Take 1 capsule (20 mg total) by mouth daily.  . ondansetron (ZOFRAN) 4 MG tablet Take 1 tablet (4 mg total) by mouth every 8 (eight) hours as needed for nausea or vomiting.  [name] oxyCODONE (OXYCONTIN) 15 mg 12 hr tablet Take 1 tablet (15 mg total) by mouth every 8 (eight) hours.  . Oxycodone HCl 10 MG TABS Take 1 tablet (10 mg total) by mouth every 4 (four) hours as needed.  . prochlorperazine (COMPAZINE) 10 MG tablet Take 1 tablet (10 mg total) by mouth every 6 (six) hours as needed for nausea or vomiting.  . traZODone (DESYREL) 50 MG tablet Take 50 mg by mouth at bedtime as needed.   No facility-administered encounter medications on file as of 08/20/2020.    PHYSICAL EXAM:   General: NAD, frail appearing, thin, pleasant male Cardiovascular: regular rate and rhythm Pulmonary: clear ant fields Extremities: no edema, no joint deformities; muscle wasting Neurological: ambulatory; generalized weakness Trevonn Hallum Ihor Gully, NP

## 2020-08-21 ENCOUNTER — Inpatient Hospital Stay: Payer: No Typology Code available for payment source

## 2020-08-21 ENCOUNTER — Telehealth: Payer: Self-pay | Admitting: Hospice and Palliative Medicine

## 2020-08-21 ENCOUNTER — Other Ambulatory Visit: Payer: Self-pay | Admitting: *Deleted

## 2020-08-21 MED ORDER — OXYCODONE HCL 10 MG PO TABS: 10.0000 mg | ORAL_TABLET | ORAL | 0 refills | Status: DC | PRN

## 2020-08-21 NOTE — Telephone Encounter (Signed)
Verbal order for hospice called to [facility].

## 2020-08-26 ENCOUNTER — Inpatient Hospital Stay: Payer: No Typology Code available for payment source

## 2020-08-26 ENCOUNTER — Inpatient Hospital Stay: Payer: No Typology Code available for payment source | Admitting: Internal Medicine

## 2020-08-26 ENCOUNTER — Inpatient Hospital Stay: Payer: No Typology Code available for payment source | Admitting: Hospice and Palliative Medicine

## 2020-09-03 DIAGNOSIS — C3412 Malignant neoplasm of upper lobe, left bronchus or lung: Secondary | ICD-10-CM | POA: Diagnosis not present

## 2020-09-03 DIAGNOSIS — R531 Weakness: Secondary | ICD-10-CM | POA: Diagnosis not present

## 2020-09-05 ENCOUNTER — Ambulatory Visit: Payer: Medicare HMO | Admitting: Radiation Oncology

## 2020-09-10 ENCOUNTER — Other Ambulatory Visit: Payer: Self-pay | Admitting: Hospice and Palliative Medicine

## 2020-09-10 ENCOUNTER — Telehealth: Payer: Self-pay | Admitting: *Deleted

## 2020-09-10 MED ORDER — MORPHINE SULFATE ER 15 MG PO TBCR: 15.0000 mg | EXTENDED_RELEASE_TABLET | Freq: Two times a day (BID) | ORAL | 0 refills | Status: DC

## 2020-09-10 NOTE — Progress Notes (Signed)
I spoke with patient's hospice nurse.  Patient is having breakthrough pain despite every 4 hour dosing of oxycodone.  He was previously taking OxyContin but this not on hospice formulary.  We will start MS Contin 15 every 12 hours.

## 2020-09-10 NOTE — Telephone Encounter (Signed)
I would recommend increasing OxyContin for long acting control vs adding another short acting opiate. Feel free to have [facility] call me to discuss.

## 2020-09-10 NOTE — Telephone Encounter (Signed)
Call returned to [facility], She will call [facility]

## 2020-09-10 NOTE — Telephone Encounter (Signed)
Horris Latino called reporting that patient Oxycodone is not controlling his pain and she is asking for order for Morphine to [facility].

## 2020-09-18 ENCOUNTER — Ambulatory Visit: Payer: Medicare HMO | Admitting: Radiation Oncology

## 2020-09-24 ENCOUNTER — Other Ambulatory Visit: Payer: Self-pay | Admitting: *Deleted

## 2020-09-24 MED ORDER — MORPHINE SULFATE ER 15 MG PO TBCR: 15.0000 mg | EXTENDED_RELEASE_TABLET | Freq: Two times a day (BID) | ORAL | 0 refills | Status: DC

## 2020-09-24 MED ORDER — OXYCODONE HCL 10 MG PO TABS: 10.0000 mg | ORAL_TABLET | ORAL | 0 refills | Status: DC | PRN

## 2020-10-01 ENCOUNTER — Other Ambulatory Visit: Payer: Self-pay | Admitting: Internal Medicine

## 2020-10-01 NOTE — Telephone Encounter (Signed)
Kevin Baker will you consider the RF request?

## 2020-10-03 DIAGNOSIS — R531 Weakness: Secondary | ICD-10-CM | POA: Diagnosis not present

## 2020-10-03 DIAGNOSIS — C3412 Malignant neoplasm of upper lobe, left bronchus or lung: Secondary | ICD-10-CM | POA: Diagnosis not present

## 2020-10-08 ENCOUNTER — Other Ambulatory Visit: Payer: Self-pay | Admitting: *Deleted

## 2020-10-08 MED ORDER — MORPHINE SULFATE ER 15 MG PO TBCR: 15.0000 mg | EXTENDED_RELEASE_TABLET | Freq: Two times a day (BID) | ORAL | 0 refills | Status: DC

## 2020-10-22 ENCOUNTER — Other Ambulatory Visit: Payer: Self-pay | Admitting: Hospice and Palliative Medicine

## 2020-10-22 MED ORDER — MORPHINE SULFATE ER 15 MG PO TBCR: 15.0000 mg | EXTENDED_RELEASE_TABLET | Freq: Two times a day (BID) | ORAL | 0 refills | Status: AC

## 2020-10-22 MED ORDER — OXYCODONE HCL 10 MG PO TABS: 10.0000 mg | ORAL_TABLET | ORAL | 0 refills | Status: AC | PRN

## 2020-10-22 NOTE — Progress Notes (Signed)
Hospice nurse requested refills of oxycodone and MS Contin. Rx sent.

## 2020-10-28 ENCOUNTER — Telehealth: Payer: Self-pay | Admitting: *Deleted

## 2020-10-28 NOTE — Telephone Encounter (Signed)
Patient hospice nurse is requesting Seroquel not on standing orders Haldol which is on the standing orders and she will call in Haldol but needs orders for Seroquel to [facility]

## 2020-10-31 ENCOUNTER — Other Ambulatory Visit: Payer: Self-pay | Admitting: Hospice and Palliative Medicine

## 2020-10-31 MED ORDER — MORPHINE SULFATE (CONCENTRATE) 10 MG /0.5 ML PO SOLN: 10.0000 mg | ORAL | 0 refills | Status: DC | PRN

## 2020-10-31 NOTE — Progress Notes (Signed)
Hospice called and requested an order for morphine elixir for pain/dyspnea.  Reportedly, patient is declining.  Will send Rx to pharmacy.

## 2020-11-03 DIAGNOSIS — R531 Weakness: Secondary | ICD-10-CM | POA: Diagnosis not present

## 2020-11-03 DIAGNOSIS — C3412 Malignant neoplasm of upper lobe, left bronchus or lung: Secondary | ICD-10-CM | POA: Diagnosis not present

## 2020-11-05 ENCOUNTER — Telehealth: Payer: Self-pay | Admitting: *Deleted

## 2020-11-05 ENCOUNTER — Other Ambulatory Visit: Payer: Self-pay | Admitting: Hospice and Palliative Medicine

## 2020-11-05 MED ORDER — MORPHINE SULFATE (CONCENTRATE) 10 MG /0.5 ML PO SOLN: 10.0000 mg | ORAL | 0 refills | Status: AC | PRN

## 2020-11-15 NOTE — Progress Notes (Signed)
I received a call from patient's hospice. Patient is declining and they requested increased dose of morphine elixir to 20mg  hourly for pain/dyspnea. Rx sent for refill.

## 2020-11-15 NOTE — Telephone Encounter (Signed)
Toni with Hospice called to report that patient expired today at 3147 PM

## 2020-11-15 DEATH — deceased

## 2021-11-19 IMAGING — US US EXTREM LOW VENOUS
1 series · 13 of 24 positions shown · non-contrast
Comparison: None.

CLINICAL DATA: Chronic bilateral lower extremity swelling and pain.



[Series 1: us venous img lower bilat (dvt) · portal-venous · 13 of 58 slices shown]
[im 1/58]
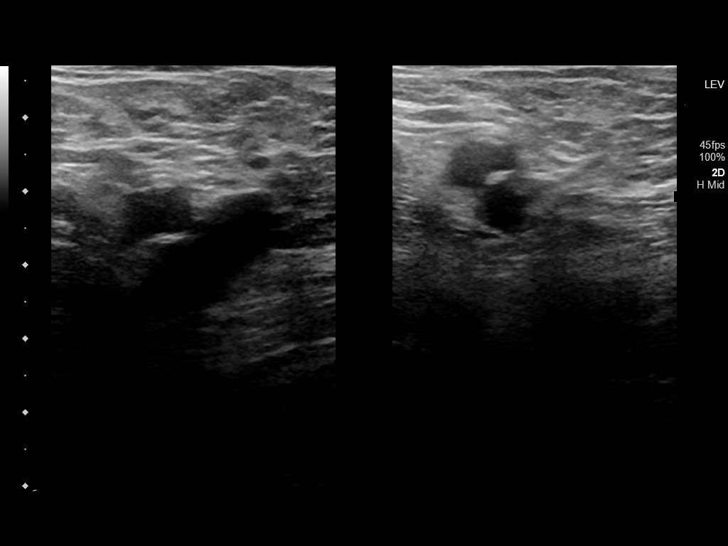
[im 5/58]
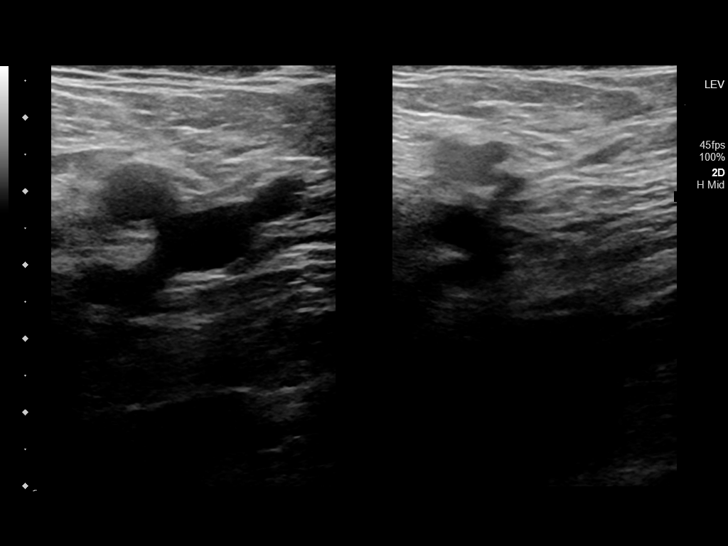
[im 10/58]
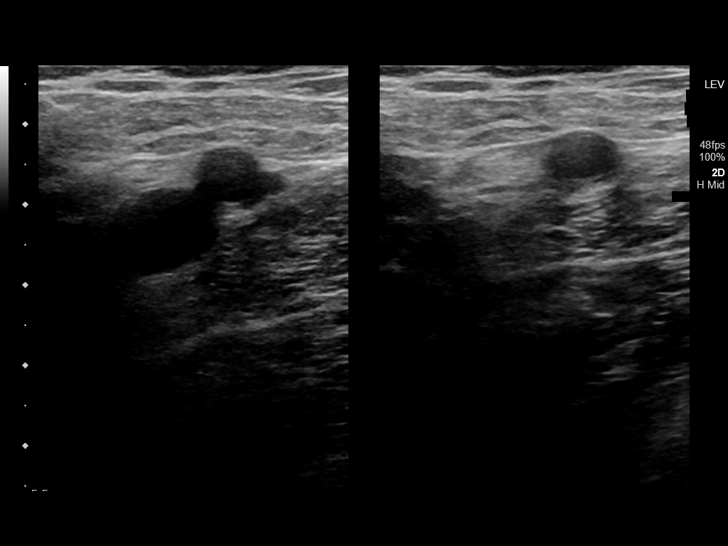
[im 15/58]
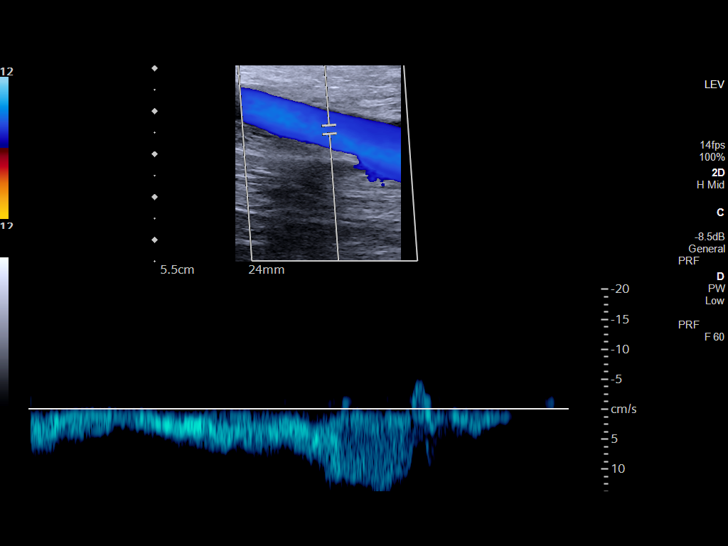
[im 20/58]
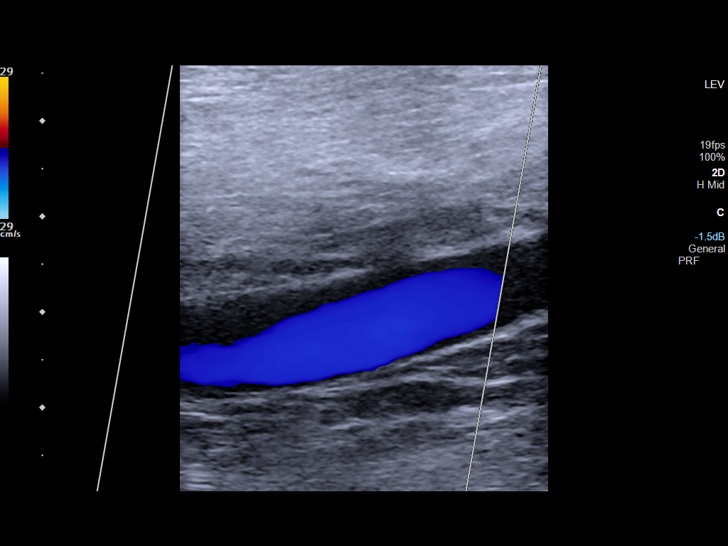
[im 25/58]
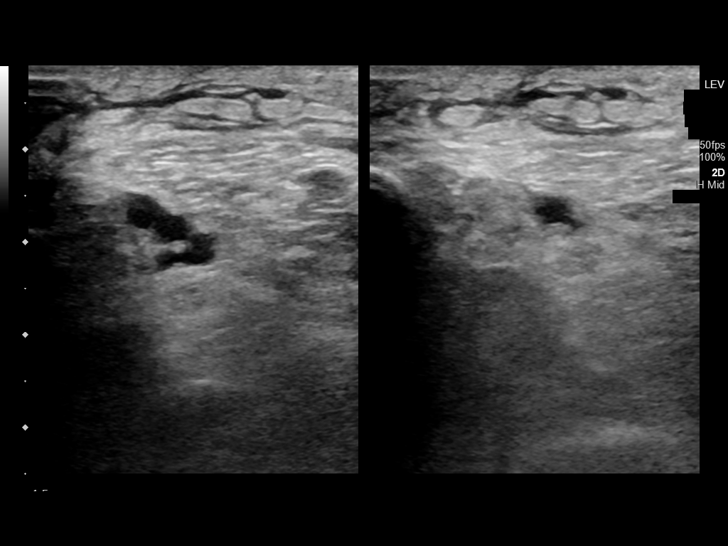
[im 30/58]
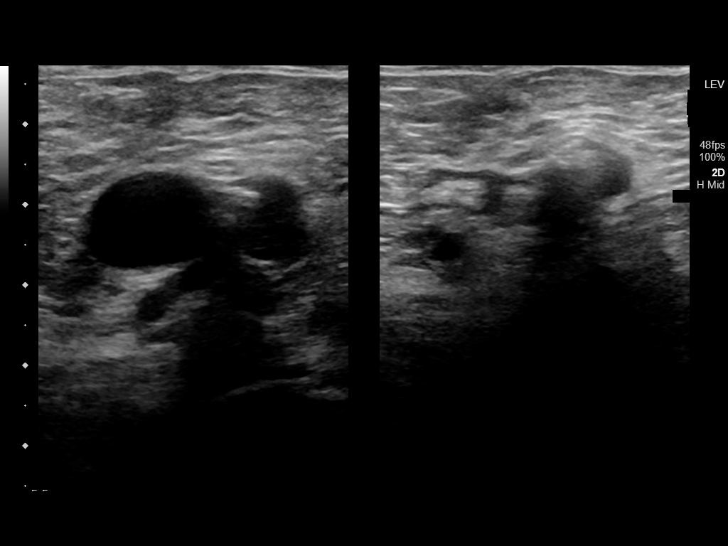
[im 33/58]
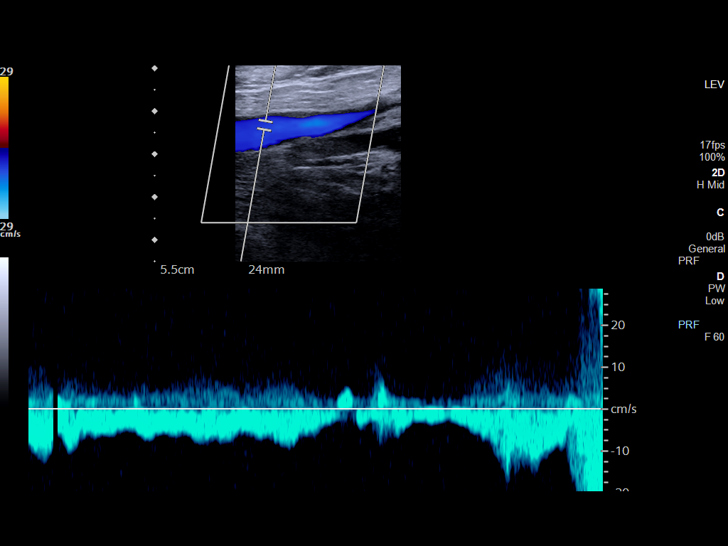
[im 38/58]
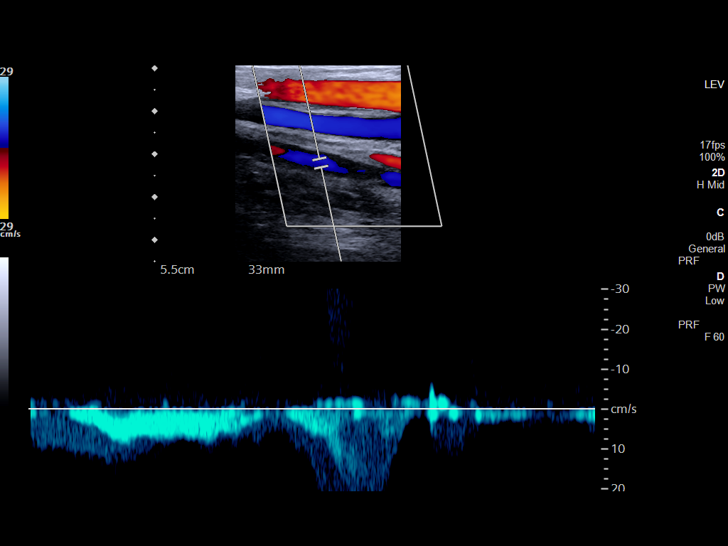
[im 43/58]
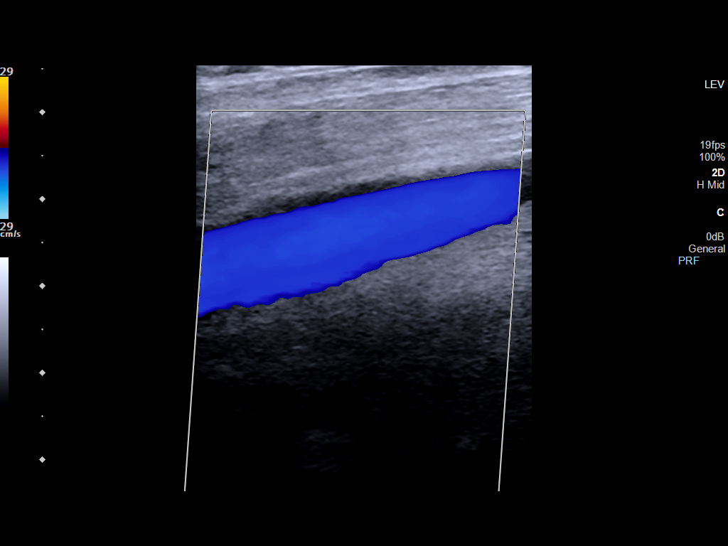
[im 48/58]
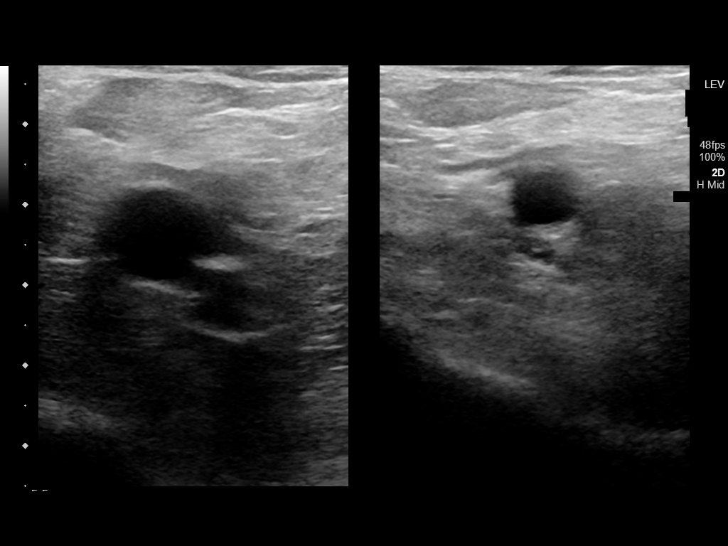
[im 53/58]
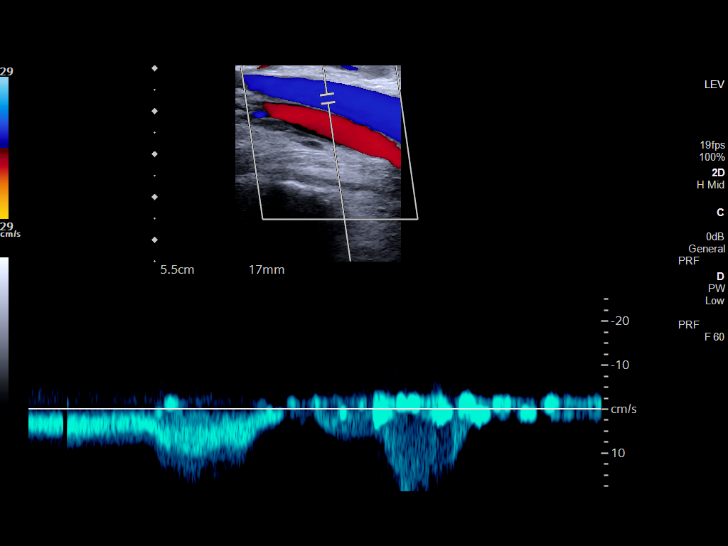
[im 58/58]
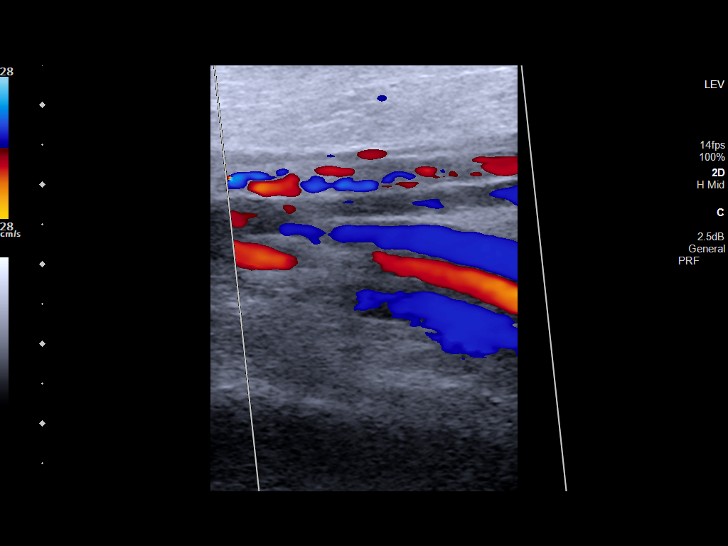

[13 of 24 positions shown; findings below may reference images not displayed]

FINDINGS: RIGHT LOWER EXTREMITY

Common Femoral Vein: No evidence of thrombus. Normal
compressibility, respiratory phasicity and response to augmentation.

Saphenofemoral Junction: No evidence of thrombus. Normal
compressibility and flow on color Doppler imaging.

Profunda Femoral Vein: No evidence of thrombus. Normal
compressibility and flow on color Doppler imaging.

Femoral Vein: No evidence of thrombus. Normal compressibility,
respiratory phasicity and response to augmentation.

Popliteal Vein: No evidence of thrombus. Normal compressibility,
respiratory phasicity and response to augmentation.

Calf Veins: No evidence of thrombus. Normal compressibility and flow
on color Doppler imaging.

Venous Reflux:  None.

Other Findings:  None.

LEFT LOWER EXTREMITY

Common Femoral Vein: No evidence of thrombus. Normal
compressibility, respiratory phasicity and response to augmentation.

Saphenofemoral Junction: No evidence of thrombus. Normal
compressibility and flow on color Doppler imaging.

Profunda Femoral Vein: No evidence of thrombus. Normal
compressibility and flow on color Doppler imaging.

Femoral Vein: No evidence of thrombus. Normal compressibility,
respiratory phasicity and response to augmentation.

Popliteal Vein: No evidence of thrombus. Normal compressibility,
respiratory phasicity and response to augmentation.

Calf Veins: No evidence of thrombus. Normal compressibility and flow
on color Doppler imaging.

Venous Reflux:  None.

Other Findings:  None.
IMPRESSION: No evidence of deep venous thrombosis in either lower extremity.

## 2022-02-01 IMAGING — MR MR HEAD WO/W CM
16 series · 48 of 48 positions shown · IV contrast (6ml Gadavist)
Comparison: None.

CLINICAL DATA: Non-small cell lung cancer staging. Left lower
extremity weakness.

EXAM:
MRI HEAD WITHOUT AND WITH CONTRAST
TECHNIQUE: Multiplanar, multiecho pulse sequences of the brain and surrounding
structures were obtained without and with intravenous contrast.
CONTRAST:  6mL GADAVIST GADOBUTROL 1 MMOL/ML IV SOLN

[Series 5: ax dwi_tracew · axial · 3.0mm · 0.71mm/px · z∈[-68,+90]mm · 6 of 112 slices shown]
[im 1/112]
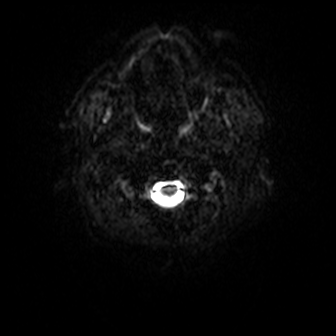
[im 23/112]
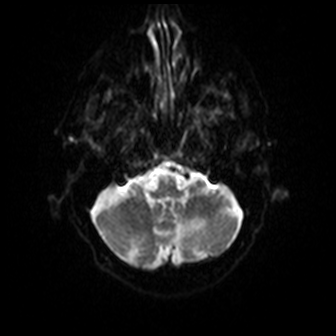
[im 45/112]
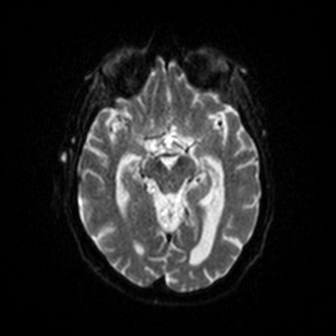
[im 67/112]
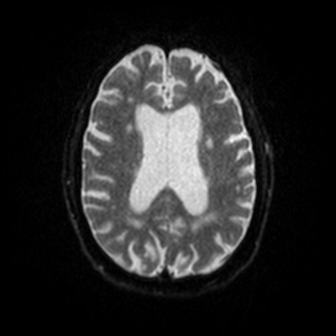
[im 89/112]
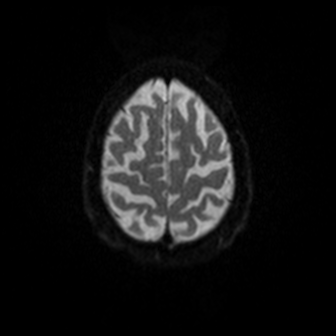
[im 112/112]
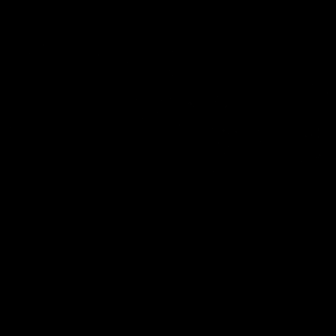

[Series 6: ax dwi_adc · axial · 3.0mm · 0.71mm/px · z∈[-68,+84]mm · 2 of 54 slices shown]
[im 1/54]
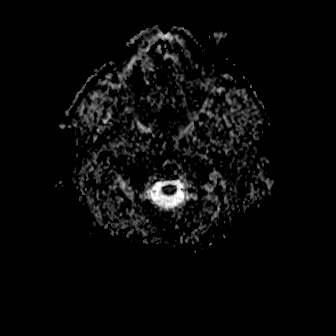
[im 54/54]
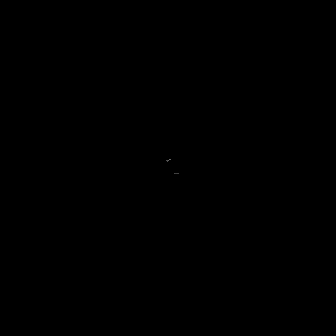

[Series 7: cor dwi_tracew · coronal · 5.0mm · 0.68mm/px · 4 of 80 slices shown]
[im 1/80]
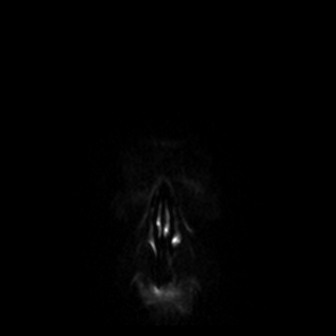
[im 27/80]
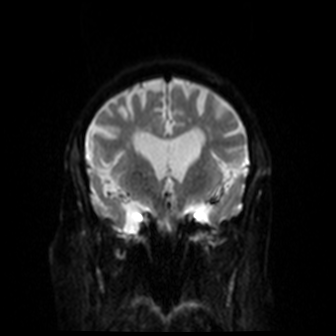
[im 53/80]
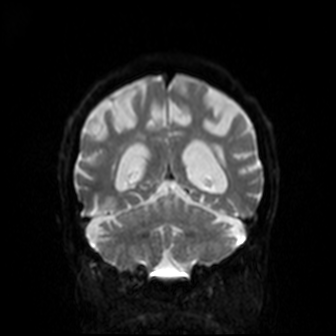
[im 80/80]
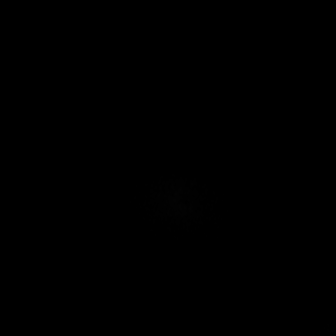

[Series 8: cor dwi_adc · coronal · 5.0mm · 0.68mm/px · 2 of 39 slices shown]
[im 1/39]
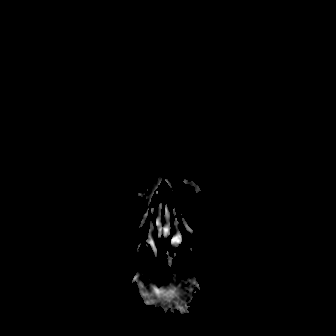
[im 39/39]
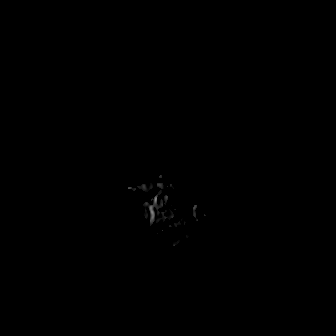

[Series 9: T1 · sagittal · 5.0mm · 0.62mm/px · 1 of 25 slices shown (1 of 2)]
[im 1/25]
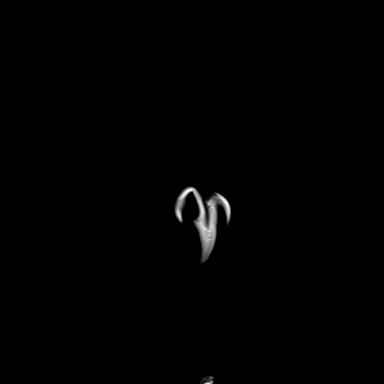

[Series 10: T2 · axial · 5.0mm · 0.53mm/px · 1 of 25 slices shown]
[im 1/25]
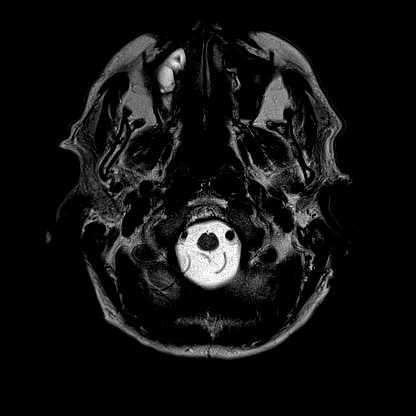

[Series 11: mag_images · axial · 3.0mm · 0.90mm/px · z∈[-73,+96]mm · 3 of 60 slices shown]
[im 1/60]
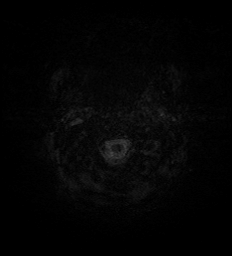
[im 30/60]
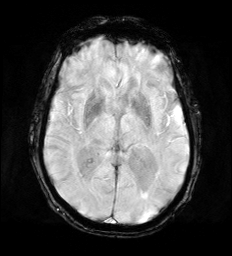
[im 60/60]
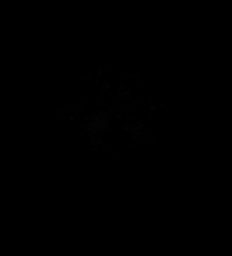

[Series 12: pha_images · axial · 3.0mm · 0.90mm/px · z∈[-73,+94]mm · 3 of 59 slices shown]
[im 1/59]
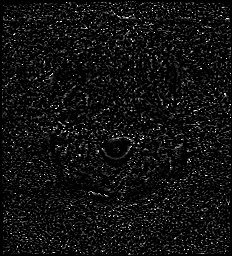
[im 30/59]
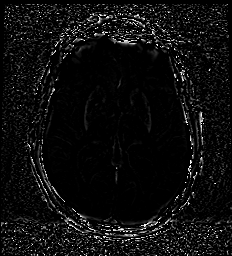
[im 59/59]
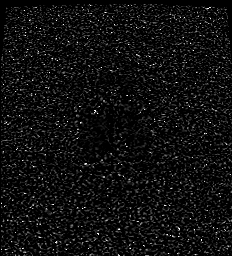

[Series 13: swi_images · axial · 3.0mm · 0.90mm/px · z∈[-73,+96]mm · 3 of 60 slices shown]
[im 1/60]
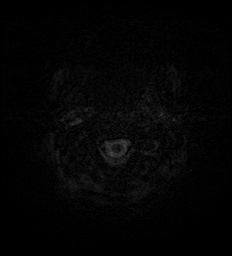
[im 30/60]
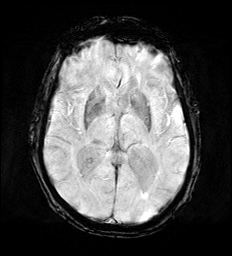
[im 60/60]
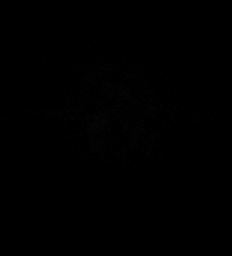

[Series 14: mip_images(sw) · axial · 24.0mm · 0.90mm/px · z∈[-63,+86]mm · 2 of 53 slices shown]
[im 1/53]
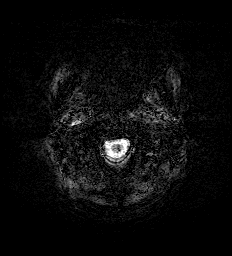
[im 53/53]
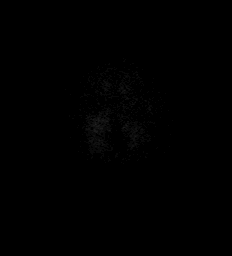

[Series 15: FLAIR · axial · 3.0mm · 0.53mm/px · z∈[-64,+91]mm · 2 of 55 slices shown]
[im 1/55]
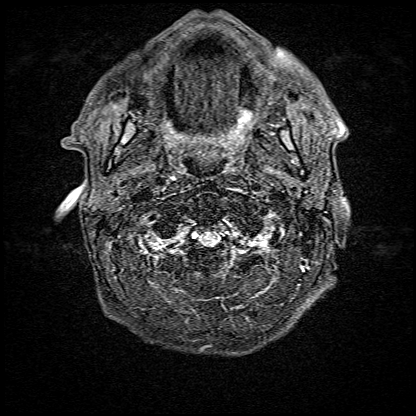
[im 55/55]
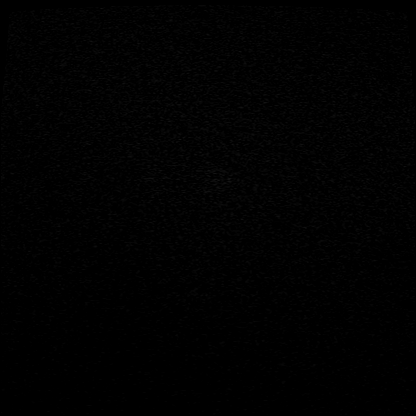

[Series 16: T1 · axial · 1.0mm · 0.98mm/px · z∈[-74,+93]mm · 8 of 176 slices shown (2 of 2)]
[im 1/176]
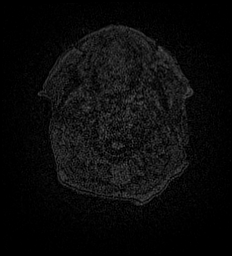
[im 26/176]
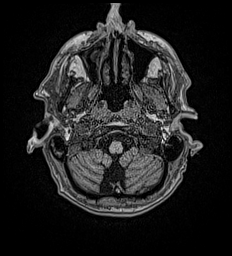
[im 51/176]
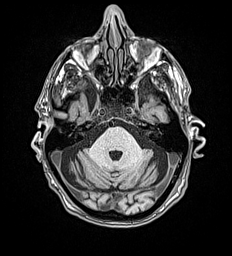
[im 76/176]
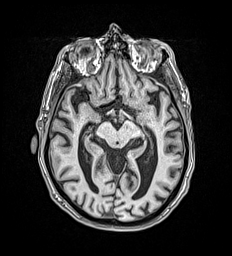
[im 101/176]
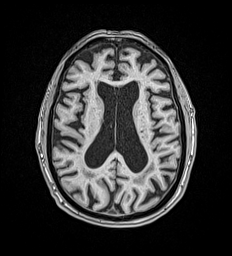
[im 126/176]
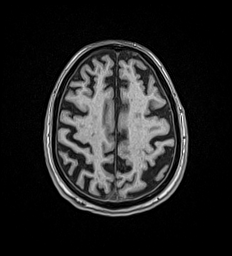
[im 151/176]
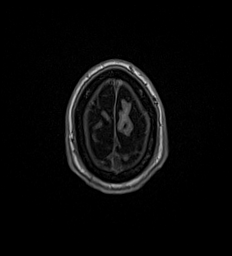
[im 176/176]
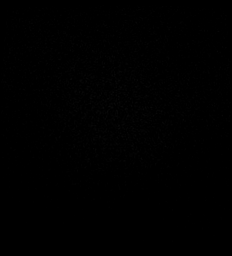

[Series 17: T2 post-contrast · coronal · 5.0mm · 0.57mm/px · 1 of 29 slices shown]
[im 1/29]
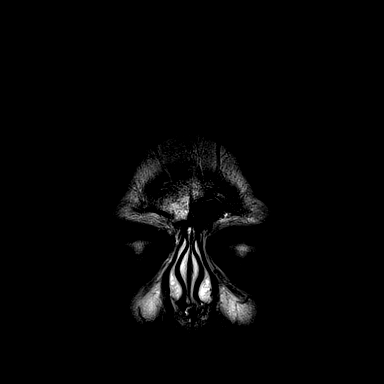

[Series 18: T1 post-contrast · axial · 1.0mm · 0.98mm/px · z∈[-74,+93]mm · 8 of 176 slices shown (1 of 3)]
[im 1/176]
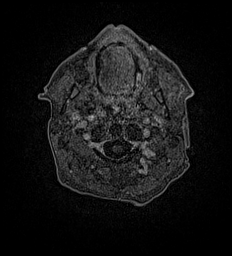
[im 26/176]
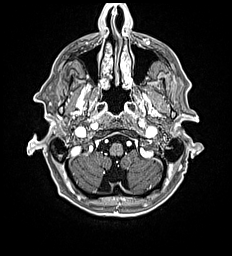
[im 51/176]
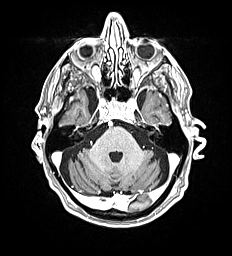
[im 76/176]
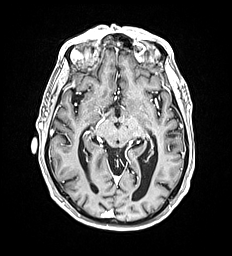
[im 101/176]
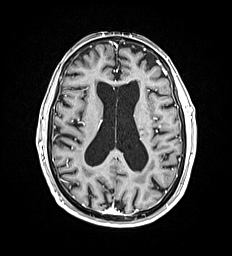
[im 126/176]
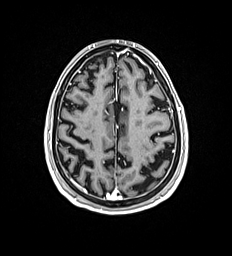
[im 151/176]
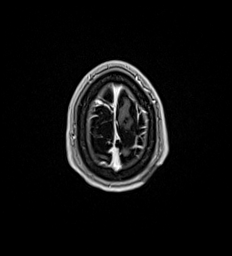
[im 176/176]
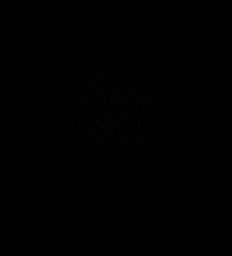

[Series 19: T1 post-contrast · coronal · 5.0mm · 0.57mm/px · 1 of 29 slices shown (2 of 3)]
[im 1/29]
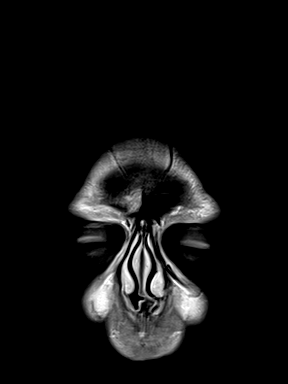

[Series 20: T1 post-contrast · sagittal · 5.0mm · 0.62mm/px · 1 of 25 slices shown (3 of 3)]
[im 1/25]
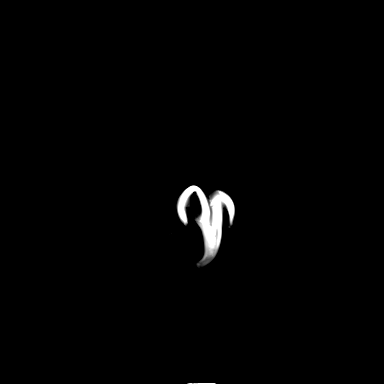

[48 of 48 positions shown; findings below may reference images not displayed]

FINDINGS: Brain: No acute infarct, intracranial hemorrhage, midline shift, or
extra-axial fluid collection is identified. Small chronic infarcts
are noted in the cerebellum bilaterally, and there are small chronic
cortical infarcts in the superior aspects of both occipital lobes
and right frontal lobe. T2 hyperintensities in the cerebral white
matter bilaterally are nonspecific but compatible with
mild-to-moderate chronic small vessel ischemic disease. There is
moderate generalized cerebral atrophy.

There is an 8 mm enhancing lesion in the medial aspect of the left
cerebellar hemisphere with mild to moderate surrounding edema
(series 18, image 45). There is no significant posterior fossa mass
effect. There is a 7 mm enhancing lesion anteriorly in the left
frontal lobe without edema (series 18, image 85).

There is a 4 mm focus of enhancement in the anterior right temporal
lobe on axial imaging (series 18, image 57) without edema. This is
not visible on coronal or sagittal imaging, potentially due to slice
selection, and this may reflect a metastasis or be vascular. There
is thin diffuse dural enhancement over both cerebral convexities
without nodularity.

Vascular: Major intracranial vascular flow voids are preserved.

Skull and upper cervical spine: Unremarkable bone marrow signal.

Sinuses/Orbits: Unremarkable orbits. Mild mucosal thickening in the
maxillary sinuses. Clear mastoid air cells.

Other: None.
IMPRESSION: 1. Small enhancing lesions in the cerebellum and left frontal lobe
consistent with metastases. Mild-to-moderate edema surrounding the
left cerebellar metastasis without significant mass effect.
2. 4 mm focus of enhancement in the anterior right temporal lobe
which may reflect a third metastasis or be vascular.
3. Mild nonspecific smooth dural enhancement over both cerebral
convexities. No nodularity to strongly suggest dural metastatic
disease.
4. Mild-to-moderate chronic small vessel ischemic disease and
moderate cerebral atrophy with multiple chronic infarcts.
5.
